# Patient Record
Sex: Female | Born: 1980
Health system: Southern US, Community
[De-identification: ages and names within clinical notes are randomized; demographics above are authoritative.]

## PROBLEM LIST (undated history)

## (undated) DIAGNOSIS — R519 Headache, unspecified: Secondary | ICD-10-CM

## (undated) DIAGNOSIS — I10 Essential (primary) hypertension: Secondary | ICD-10-CM

## (undated) DIAGNOSIS — Z720 Tobacco use: Secondary | ICD-10-CM

## (undated) DIAGNOSIS — E079 Disorder of thyroid, unspecified: Secondary | ICD-10-CM

## (undated) DIAGNOSIS — R0602 Shortness of breath: Secondary | ICD-10-CM

## (undated) DIAGNOSIS — A159 Respiratory tuberculosis unspecified: Secondary | ICD-10-CM

## (undated) DIAGNOSIS — R079 Chest pain, unspecified: Secondary | ICD-10-CM

## (undated) HISTORY — DX: Tobacco use: Z72.0

## (undated) HISTORY — DX: Headache, unspecified: R51.9

## (undated) HISTORY — DX: Chest pain, unspecified: R07.9

## (undated) HISTORY — DX: Respiratory tuberculosis unspecified: A15.9

## (undated) HISTORY — DX: Disorder of thyroid, unspecified: E07.9

## (undated) HISTORY — DX: Shortness of breath: R06.02

---

## 2000-07-26 HISTORY — PX: CHOLECYSTECTOMY: SHX55

## 2003-01-21 ENCOUNTER — Emergency Department (HOSPITAL_COMMUNITY): Admission: EM | Admit: 2003-01-21 | Discharge: 2003-01-21 | Payer: Self-pay | Admitting: Emergency Medicine

## 2003-03-05 ENCOUNTER — Emergency Department (HOSPITAL_COMMUNITY): Admission: EM | Admit: 2003-03-05 | Discharge: 2003-03-06 | Payer: Self-pay | Admitting: Emergency Medicine

## 2003-04-12 ENCOUNTER — Ambulatory Visit (HOSPITAL_COMMUNITY): Admission: RE | Admit: 2003-04-12 | Discharge: 2003-04-12 | Payer: Self-pay | Admitting: *Deleted

## 2003-05-18 ENCOUNTER — Inpatient Hospital Stay (HOSPITAL_COMMUNITY): Admission: AD | Admit: 2003-05-18 | Discharge: 2003-05-19 | Payer: Self-pay | Admitting: *Deleted

## 2003-06-17 ENCOUNTER — Ambulatory Visit (HOSPITAL_COMMUNITY): Admission: RE | Admit: 2003-06-17 | Discharge: 2003-06-17 | Payer: Self-pay | Admitting: *Deleted

## 2003-08-26 ENCOUNTER — Encounter: Admission: RE | Admit: 2003-08-26 | Discharge: 2003-08-26 | Payer: Self-pay | Admitting: *Deleted

## 2003-08-30 ENCOUNTER — Inpatient Hospital Stay (HOSPITAL_COMMUNITY): Admission: AD | Admit: 2003-08-30 | Discharge: 2003-08-31 | Payer: Self-pay | Admitting: Family Medicine

## 2003-10-22 ENCOUNTER — Emergency Department (HOSPITAL_COMMUNITY): Admission: EM | Admit: 2003-10-22 | Discharge: 2003-10-23 | Payer: Self-pay | Admitting: Emergency Medicine

## 2003-12-12 ENCOUNTER — Emergency Department (HOSPITAL_COMMUNITY): Admission: EM | Admit: 2003-12-12 | Discharge: 2003-12-12 | Payer: Self-pay | Admitting: Emergency Medicine

## 2006-02-07 ENCOUNTER — Emergency Department (HOSPITAL_COMMUNITY): Admission: EM | Admit: 2006-02-07 | Discharge: 2006-02-07 | Payer: Self-pay | Admitting: Emergency Medicine

## 2006-10-10 ENCOUNTER — Inpatient Hospital Stay (HOSPITAL_COMMUNITY): Admission: AD | Admit: 2006-10-10 | Discharge: 2006-10-10 | Payer: Self-pay | Admitting: Family Medicine

## 2006-10-10 ENCOUNTER — Ambulatory Visit: Payer: Self-pay | Admitting: Family Medicine

## 2006-11-17 ENCOUNTER — Ambulatory Visit (HOSPITAL_COMMUNITY): Admission: RE | Admit: 2006-11-17 | Discharge: 2006-11-17 | Payer: Self-pay | Admitting: Obstetrics & Gynecology

## 2007-02-21 ENCOUNTER — Ambulatory Visit: Payer: Self-pay | Admitting: Obstetrics & Gynecology

## 2007-02-26 ENCOUNTER — Ambulatory Visit: Payer: Self-pay | Admitting: *Deleted

## 2007-02-26 ENCOUNTER — Inpatient Hospital Stay (HOSPITAL_COMMUNITY): Admission: AD | Admit: 2007-02-26 | Discharge: 2007-02-28 | Payer: Self-pay | Admitting: Family Medicine

## 2008-04-11 ENCOUNTER — Emergency Department (HOSPITAL_COMMUNITY): Admission: EM | Admit: 2008-04-11 | Discharge: 2008-04-12 | Payer: Self-pay | Admitting: *Deleted

## 2011-04-13 LAB — HEPATITIS B SURFACE ANTIGEN: Hepatitis B Surface Ag: NEGATIVE

## 2011-04-13 LAB — HIV ANTIBODY (ROUTINE TESTING W REFLEX)
HIV: NONREACTIVE
HIV: NONREACTIVE

## 2011-04-13 LAB — GC/CHLAMYDIA PROBE AMP, GENITAL
Chlamydia: NEGATIVE
Gonorrhea: NEGATIVE

## 2011-04-13 LAB — ABO/RH: RH Type: POSITIVE

## 2011-04-13 LAB — ANTIBODY SCREEN: Antibody Screen: NEGATIVE

## 2011-04-13 LAB — RUBELLA ANTIBODY, IGM: Rubella: IMMUNE

## 2011-04-13 LAB — RPR
RPR: NONREACTIVE
RPR: NONREACTIVE

## 2011-05-10 LAB — CBC
HCT: 27.8 — ABNORMAL LOW
HCT: 34.5 — ABNORMAL LOW
Hemoglobin: 12
Hemoglobin: 9.8 — ABNORMAL LOW
MCHC: 34.8
MCHC: 35.3
MCV: 89.2
MCV: 89.3
Platelets: 200
Platelets: 243
RBC: 3.11 — ABNORMAL LOW
RBC: 3.87
RDW: 13.6
RDW: 13.9
WBC: 7.9
WBC: 8.2

## 2011-05-10 LAB — RPR: RPR Ser Ql: NONREACTIVE

## 2011-06-28 ENCOUNTER — Encounter (HOSPITAL_COMMUNITY): Payer: Self-pay | Admitting: *Deleted

## 2011-06-28 ENCOUNTER — Telehealth (HOSPITAL_COMMUNITY): Payer: Self-pay | Admitting: *Deleted

## 2011-06-28 NOTE — Telephone Encounter (Signed)
Preadmission screen  

## 2011-06-29 ENCOUNTER — Encounter (HOSPITAL_COMMUNITY): Payer: Self-pay | Admitting: *Deleted

## 2011-06-29 NOTE — Telephone Encounter (Signed)
Preadmission screen  

## 2011-07-01 ENCOUNTER — Inpatient Hospital Stay (HOSPITAL_COMMUNITY)
Admission: AD | Admit: 2011-07-01 | Discharge: 2011-07-04 | DRG: 766 | Disposition: A | Discharge status: Home / Self Care | Payer: Medicaid Other | Source: Ambulatory Visit | Attending: Obstetrics | Admitting: Obstetrics

## 2011-07-01 ENCOUNTER — Encounter (HOSPITAL_COMMUNITY): Payer: Self-pay

## 2011-07-01 DIAGNOSIS — Z98891 History of uterine scar from previous surgery: Secondary | ICD-10-CM

## 2011-07-01 DIAGNOSIS — O321XX0 Maternal care for breech presentation, not applicable or unspecified: Secondary | ICD-10-CM | POA: Diagnosis present

## 2011-07-01 DIAGNOSIS — O99814 Abnormal glucose complicating childbirth: Principal | ICD-10-CM | POA: Diagnosis present

## 2011-07-01 LAB — CBC
Hemoglobin: 13 g/dL (ref 12.0–15.0)
MCHC: 34.8 g/dL (ref 30.0–36.0)
RDW: 12.6 % (ref 11.5–15.5)
WBC: 8 10*3/uL (ref 4.0–10.5)

## 2011-07-01 LAB — RPR: RPR Ser Ql: NONREACTIVE

## 2011-07-01 LAB — GLUCOSE, CAPILLARY: Glucose-Capillary: 75 mg/dL (ref 70–99)

## 2011-07-01 LAB — HIV ANTIBODY (ROUTINE TESTING W REFLEX): HIV: NONREACTIVE

## 2011-07-01 MED ORDER — BUTORPHANOL TARTRATE 2 MG/ML IJ SOLN: 2.0000 mg | INTRAMUSCULAR | Status: DC | PRN

## 2011-07-01 MED ORDER — OXYCODONE-ACETAMINOPHEN 5-325 MG PO TABS: 2.0000 | ORAL_TABLET | ORAL | Status: DC | PRN

## 2011-07-01 MED ORDER — OXYTOCIN BOLUS FROM INFUSION
500.0000 mL | Freq: Once | INTRAVENOUS | Status: DC
  Filled 2011-07-01: qty 500

## 2011-07-01 MED ORDER — ACETAMINOPHEN 325 MG PO TABS: 650.0000 mg | ORAL_TABLET | ORAL | Status: DC | PRN

## 2011-07-01 MED ORDER — MISOPROSTOL 25 MCG QUARTER TABLET
25.0000 ug | ORAL_TABLET | Freq: Once | ORAL | Status: AC
  Administered 2011-07-01: 25 ug via VAGINAL
  Filled 2011-07-01: qty 0.25

## 2011-07-01 MED ORDER — ONDANSETRON HCL 4 MG/2ML IJ SOLN: 4.0000 mg | Freq: Four times a day (QID) | INTRAMUSCULAR | Status: DC | PRN

## 2011-07-01 MED ORDER — LIDOCAINE HCL (PF) 1 % IJ SOLN: 30.0000 mL | INTRAMUSCULAR | Status: DC | PRN

## 2011-07-01 MED ORDER — BUTORPHANOL TARTRATE 2 MG/ML IJ SOLN
1.0000 mg | INTRAMUSCULAR | Status: DC | PRN
  Administered 2011-07-01: 2 mg via INTRAVENOUS
  Filled 2011-07-01: qty 1

## 2011-07-01 MED ORDER — LACTATED RINGERS IV SOLN
500.0000 mL | INTRAVENOUS | Status: DC | PRN
  Administered 2011-07-01: 500 mL via INTRAVENOUS

## 2011-07-01 MED ORDER — OXYTOCIN 20 UNITS IN LACTATED RINGERS INFUSION - SIMPLE
1.0000 m[IU]/min | INTRAVENOUS | Status: DC
  Administered 2011-07-01: 1 m[IU]/min via INTRAVENOUS
  Filled 2011-07-01: qty 1000

## 2011-07-01 MED ORDER — OXYTOCIN 20 UNITS IN LACTATED RINGERS INFUSION - SIMPLE: 125.0000 mL/h | Freq: Once | INTRAVENOUS | Status: DC

## 2011-07-01 MED ORDER — TERBUTALINE SULFATE 1 MG/ML IJ SOLN: 0.2500 mg | Freq: Once | INTRAMUSCULAR | Status: AC | PRN

## 2011-07-01 MED ORDER — CITRIC ACID-SODIUM CITRATE 334-500 MG/5ML PO SOLN
30.0000 mL | ORAL | Status: DC | PRN
  Administered 2011-07-02: 30 mL via ORAL
  Filled 2011-07-01: qty 15

## 2011-07-01 MED ORDER — IBUPROFEN 600 MG PO TABS: 600.0000 mg | ORAL_TABLET | Freq: Four times a day (QID) | ORAL | Status: DC | PRN

## 2011-07-01 MED ORDER — LACTATED RINGERS IV SOLN
INTRAVENOUS | Status: DC
  Administered 2011-07-01 (×4): via INTRAVENOUS

## 2011-07-01 MED ORDER — FLEET ENEMA 7-19 GM/118ML RE ENEM: 1.0000 | ENEMA | RECTAL | Status: DC | PRN

## 2011-07-01 MED ORDER — BUTORPHANOL TARTRATE 2 MG/ML IJ SOLN: 1.0000 mg | INTRAMUSCULAR | Status: DC | PRN

## 2011-07-01 NOTE — Progress Notes (Signed)
MD made aware of pt status: SVE, uterine contraction pattern, FHT, Pitocin setting and SVE. New orders received. Will continue to monitor.

## 2011-07-01 NOTE — Progress Notes (Signed)
Dr Gaynell Face notified of pt status, FHR tracing with variables and varibility, Uc freq, interventions provided, and SVE. Order for pitocin.

## 2011-07-01 NOTE — Progress Notes (Signed)
Confirmed with office/Dr Marshall-pt HIV negative since pt had intermittent prenatal care.

## 2011-07-01 NOTE — H&P (Signed)
This is Dr. Francoise Ceo dictating the history and physical on  Tracy Carson she's a 30 year old gravida 4 para 63 old 3 at 44 weeks and 1 day her judicious 07/07/2011 negative GBS negative HIV the patient permissible her prenatal visits and had a normal Glucola screen and she will have a three-hour GTT brochures out-of-town surgery was decided should've brought in at 39 weeks for induction her glucose on admission was sent to 5 and she is having irregular contractions Cytotec was inserted and she still having mild irregular contractions Past medical history negative Past surgical history negative Social history negative System review noncontributory Physical exam Well-developed female in no distress HEENT negative Lungs clear to P&A Heart regular rhythm no murmurs no gallops Breasts negative Abdomen term Pelvic as described above Extremities negative

## 2011-07-01 NOTE — Progress Notes (Signed)
0945 Pt states she understands current POC for Cytotec, now and repeated x1 if no labor and possible low dose pitocin in PM.  States understanding she may deliver tomorrow due to unfavorable cervix.  FOB present. Offered Interpreter-pt states she understands POC and does not need interpreter.  Pt states she understands last HIV result negative and one is pending.

## 2011-07-02 ENCOUNTER — Encounter (HOSPITAL_COMMUNITY): Payer: Self-pay | Admitting: Anesthesiology

## 2011-07-02 ENCOUNTER — Other Ambulatory Visit: Payer: Self-pay | Admitting: Obstetrics & Gynecology

## 2011-07-02 ENCOUNTER — Inpatient Hospital Stay (HOSPITAL_COMMUNITY): Payer: Medicaid Other | Admitting: Anesthesiology

## 2011-07-02 ENCOUNTER — Encounter (HOSPITAL_COMMUNITY): Payer: Self-pay

## 2011-07-02 ENCOUNTER — Inpatient Hospital Stay (HOSPITAL_COMMUNITY): Payer: Medicaid Other

## 2011-07-02 ENCOUNTER — Encounter (HOSPITAL_COMMUNITY)
Admission: AD | Disposition: A | Discharge status: Home / Self Care | Payer: Self-pay | Source: Ambulatory Visit | Attending: Obstetrics

## 2011-07-02 DIAGNOSIS — O321XX0 Maternal care for breech presentation, not applicable or unspecified: Secondary | ICD-10-CM | POA: Diagnosis present

## 2011-07-02 LAB — CBC
HCT: 30.7 % — ABNORMAL LOW (ref 36.0–46.0)
Hemoglobin: 10.7 g/dL — ABNORMAL LOW (ref 12.0–15.0)
MCV: 85 fL (ref 78.0–100.0)
RBC: 3.61 MIL/uL — ABNORMAL LOW (ref 3.87–5.11)
RDW: 12.8 % (ref 11.5–15.5)
WBC: 10.5 10*3/uL (ref 4.0–10.5)

## 2011-07-02 LAB — GLUCOSE, CAPILLARY: Glucose-Capillary: 99 mg/dL (ref 70–99)

## 2011-07-02 LAB — TYPE AND SCREEN: ABO/RH(D): O POS

## 2011-07-02 SURGERY — Surgical Case
Anesthesia: Spinal | Site: Abdomen | Wound class: Clean Contaminated

## 2011-07-02 MED ORDER — MORPHINE SULFATE 0.5 MG/ML IJ SOLN
INTRAMUSCULAR | Status: AC
  Filled 2011-07-02: qty 10

## 2011-07-02 MED ORDER — DIBUCAINE 1 % RE OINT: 1.0000 "application " | TOPICAL_OINTMENT | RECTAL | Status: DC | PRN

## 2011-07-02 MED ORDER — OXYTOCIN 20 UNITS IN LACTATED RINGERS INFUSION - SIMPLE: 125.0000 mL/h | INTRAVENOUS | Status: AC

## 2011-07-02 MED ORDER — LACTATED RINGERS IV SOLN
INTRAVENOUS | Status: DC
  Administered 2011-07-02: 11:00:00 via INTRAVENOUS

## 2011-07-02 MED ORDER — IBUPROFEN 600 MG PO TABS
600.0000 mg | ORAL_TABLET | Freq: Four times a day (QID) | ORAL | Status: DC
  Administered 2011-07-02 – 2011-07-04 (×9): 600 mg via ORAL
  Filled 2011-07-02 (×5): qty 1

## 2011-07-02 MED ORDER — MEASLES, MUMPS & RUBELLA VAC ~~LOC~~ INJ
0.5000 mL | INJECTION | Freq: Once | SUBCUTANEOUS | Status: DC
  Filled 2011-07-02: qty 0.5

## 2011-07-02 MED ORDER — CEFAZOLIN SODIUM 1-5 GM-% IV SOLN
INTRAVENOUS | Status: DC | PRN
  Administered 2011-07-02: 2 g via INTRAVENOUS

## 2011-07-02 MED ORDER — SIMETHICONE 80 MG PO CHEW
80.0000 mg | CHEWABLE_TABLET | ORAL | Status: DC | PRN
  Administered 2011-07-03 (×2): 80 mg via ORAL

## 2011-07-02 MED ORDER — SODIUM CHLORIDE 0.9 % IV SOLN
1.0000 ug/kg/h | INTRAVENOUS | Status: DC | PRN
  Filled 2011-07-02: qty 2.5

## 2011-07-02 MED ORDER — SCOPOLAMINE 1 MG/3DAYS TD PT72
MEDICATED_PATCH | TRANSDERMAL | Status: AC
  Filled 2011-07-02: qty 1

## 2011-07-02 MED ORDER — DIPHENHYDRAMINE HCL 50 MG/ML IJ SOLN
INTRAMUSCULAR | Status: DC | PRN
  Administered 2011-07-02: 25 mg via INTRAVENOUS

## 2011-07-02 MED ORDER — KETOROLAC TROMETHAMINE 30 MG/ML IJ SOLN
30.0000 mg | Freq: Four times a day (QID) | INTRAMUSCULAR | Status: AC | PRN
  Administered 2011-07-02: 30 mg via INTRAVENOUS
  Filled 2011-07-02 (×2): qty 1

## 2011-07-02 MED ORDER — CEFAZOLIN SODIUM-DEXTROSE 2-3 GM-% IV SOLR
2.0000 g | INTRAVENOUS | Status: DC
Start: 2011-07-02 — End: 2011-07-02
  Filled 2011-07-02: qty 50

## 2011-07-02 MED ORDER — FENTANYL CITRATE 0.05 MG/ML IJ SOLN
INTRAMUSCULAR | Status: AC
  Filled 2011-07-02: qty 2

## 2011-07-02 MED ORDER — ONDANSETRON HCL 4 MG/2ML IJ SOLN: 4.0000 mg | Freq: Three times a day (TID) | INTRAMUSCULAR | Status: DC | PRN

## 2011-07-02 MED ORDER — OXYTOCIN 20 UNITS IN LACTATED RINGERS INFUSION - SIMPLE
INTRAVENOUS | Status: DC | PRN
  Administered 2011-07-02 (×2): 20 [IU] via INTRAVENOUS

## 2011-07-02 MED ORDER — DIPHENHYDRAMINE HCL 25 MG PO CAPS: 25.0000 mg | ORAL_CAPSULE | ORAL | Status: DC | PRN

## 2011-07-02 MED ORDER — IBUPROFEN 600 MG PO TABS
600.0000 mg | ORAL_TABLET | Freq: Four times a day (QID) | ORAL | Status: DC | PRN
  Filled 2011-07-02 (×4): qty 1

## 2011-07-02 MED ORDER — ONDANSETRON HCL 4 MG/2ML IJ SOLN: 4.0000 mg | INTRAMUSCULAR | Status: DC | PRN

## 2011-07-02 MED ORDER — FERROUS SULFATE 325 (65 FE) MG PO TABS
325.0000 mg | ORAL_TABLET | Freq: Two times a day (BID) | ORAL | Status: DC
  Administered 2011-07-02 – 2011-07-04 (×4): 325 mg via ORAL
  Filled 2011-07-02 (×4): qty 1

## 2011-07-02 MED ORDER — DIPHENHYDRAMINE HCL 50 MG/ML IJ SOLN: 12.5000 mg | INTRAMUSCULAR | Status: DC | PRN

## 2011-07-02 MED ORDER — TETANUS-DIPHTH-ACELL PERTUSSIS 5-2.5-18.5 LF-MCG/0.5 IM SUSP
0.5000 mL | Freq: Once | INTRAMUSCULAR | Status: AC
  Administered 2011-07-03: 0.5 mL via INTRAMUSCULAR
  Filled 2011-07-02: qty 0.5

## 2011-07-02 MED ORDER — NALOXONE HCL 0.4 MG/ML IJ SOLN: 0.4000 mg | INTRAMUSCULAR | Status: DC | PRN

## 2011-07-02 MED ORDER — LANOLIN HYDROUS EX OINT: 1.0000 "application " | TOPICAL_OINTMENT | CUTANEOUS | Status: DC | PRN

## 2011-07-02 MED ORDER — KETOROLAC TROMETHAMINE 30 MG/ML IJ SOLN: 30.0000 mg | Freq: Once | INTRAMUSCULAR | Status: AC

## 2011-07-02 MED ORDER — DIPHENHYDRAMINE HCL 50 MG/ML IJ SOLN
INTRAMUSCULAR | Status: AC
  Filled 2011-07-02: qty 1

## 2011-07-02 MED ORDER — FENTANYL CITRATE 0.05 MG/ML IJ SOLN
INTRAMUSCULAR | Status: DC | PRN
  Administered 2011-07-02: 15 ug via INTRATHECAL

## 2011-07-02 MED ORDER — CEFAZOLIN SODIUM 1-5 GM-% IV SOLN
1.0000 g | Freq: Three times a day (TID) | INTRAVENOUS | Status: AC
  Administered 2011-07-02 – 2011-07-03 (×3): 1 g via INTRAVENOUS
  Filled 2011-07-02 (×3): qty 50

## 2011-07-02 MED ORDER — ZOLPIDEM TARTRATE 5 MG PO TABS: 5.0000 mg | ORAL_TABLET | Freq: Every evening | ORAL | Status: DC | PRN

## 2011-07-02 MED ORDER — FENTANYL CITRATE 0.05 MG/ML IJ SOLN
INTRAMUSCULAR | Status: DC | PRN
  Administered 2011-07-02: 50 ug via INTRAVENOUS
  Administered 2011-07-02: 35 ug via INTRAVENOUS

## 2011-07-02 MED ORDER — KETOROLAC TROMETHAMINE 60 MG/2ML IM SOLN
60.0000 mg | Freq: Once | INTRAMUSCULAR | Status: AC | PRN
  Administered 2011-07-02: 60 mg via INTRAMUSCULAR

## 2011-07-02 MED ORDER — CEFAZOLIN SODIUM 1-5 GM-% IV SOLN
INTRAVENOUS | Status: AC
  Filled 2011-07-02: qty 100

## 2011-07-02 MED ORDER — MORPHINE SULFATE (PF) 0.5 MG/ML IJ SOLN
INTRAMUSCULAR | Status: DC | PRN
  Administered 2011-07-02: .1 mg via EPIDURAL

## 2011-07-02 MED ORDER — ONDANSETRON HCL 4 MG PO TABS: 4.0000 mg | ORAL_TABLET | ORAL | Status: DC | PRN

## 2011-07-02 MED ORDER — ONDANSETRON HCL 4 MG/2ML IJ SOLN
INTRAMUSCULAR | Status: AC
  Filled 2011-07-02: qty 2

## 2011-07-02 MED ORDER — MORPHINE SULFATE (PF) 0.5 MG/ML IJ SOLN
INTRAMUSCULAR | Status: DC | PRN
  Administered 2011-07-02: 2 ug via EPIDURAL

## 2011-07-02 MED ORDER — NALBUPHINE HCL 10 MG/ML IJ SOLN
5.0000 mg | INTRAMUSCULAR | Status: DC | PRN
  Filled 2011-07-02: qty 1

## 2011-07-02 MED ORDER — KETOROLAC TROMETHAMINE 30 MG/ML IJ SOLN: 30.0000 mg | Freq: Four times a day (QID) | INTRAMUSCULAR | Status: AC | PRN

## 2011-07-02 MED ORDER — ONDANSETRON HCL 4 MG/2ML IJ SOLN
INTRAMUSCULAR | Status: DC | PRN
  Administered 2011-07-02: 4 mg via INTRAVENOUS

## 2011-07-02 MED ORDER — METOCLOPRAMIDE HCL 5 MG/ML IJ SOLN: 10.0000 mg | Freq: Three times a day (TID) | INTRAMUSCULAR | Status: DC | PRN

## 2011-07-02 MED ORDER — WITCH HAZEL-GLYCERIN EX PADS: 1.0000 "application " | MEDICATED_PAD | CUTANEOUS | Status: DC | PRN

## 2011-07-02 MED ORDER — DIPHENHYDRAMINE HCL 25 MG PO CAPS: 25.0000 mg | ORAL_CAPSULE | Freq: Four times a day (QID) | ORAL | Status: DC | PRN

## 2011-07-02 MED ORDER — BUPIVACAINE IN DEXTROSE 0.75-8.25 % IT SOLN
INTRATHECAL | Status: DC | PRN
  Administered 2011-07-02: 11.75 mg via INTRATHECAL

## 2011-07-02 MED ORDER — PRENATAL PLUS 27-1 MG PO TABS
1.0000 | ORAL_TABLET | Freq: Every day | ORAL | Status: DC
  Administered 2011-07-02 – 2011-07-04 (×3): 1 via ORAL
  Filled 2011-07-02 (×3): qty 1

## 2011-07-02 MED ORDER — DIPHENHYDRAMINE HCL 50 MG/ML IJ SOLN: 25.0000 mg | INTRAMUSCULAR | Status: DC | PRN

## 2011-07-02 MED ORDER — MEPERIDINE HCL 25 MG/ML IJ SOLN: 6.2500 mg | INTRAMUSCULAR | Status: DC | PRN

## 2011-07-02 MED ORDER — SCOPOLAMINE 1 MG/3DAYS TD PT72
1.0000 | MEDICATED_PATCH | Freq: Once | TRANSDERMAL | Status: DC
  Administered 2011-07-02: 1.5 mg via TRANSDERMAL

## 2011-07-02 MED ORDER — LACTATED RINGERS IV SOLN
INTRAVENOUS | Status: DC | PRN
  Administered 2011-07-02 (×3): via INTRAVENOUS

## 2011-07-02 MED ORDER — OXYCODONE-ACETAMINOPHEN 5-325 MG PO TABS
1.0000 | ORAL_TABLET | ORAL | Status: DC | PRN
  Administered 2011-07-02 (×3): 1 via ORAL
  Administered 2011-07-02 – 2011-07-03 (×2): 2 via ORAL
  Administered 2011-07-03: 1 via ORAL
  Administered 2011-07-03: 2 via ORAL
  Administered 2011-07-03: 1 via ORAL
  Administered 2011-07-03 – 2011-07-04 (×3): 2 via ORAL
  Filled 2011-07-02 (×4): qty 2
  Filled 2011-07-02: qty 1
  Filled 2011-07-02: qty 2
  Filled 2011-07-02 (×3): qty 1
  Filled 2011-07-02 (×2): qty 2

## 2011-07-02 MED ORDER — SODIUM CHLORIDE 0.9 % IJ SOLN: 3.0000 mL | INTRAMUSCULAR | Status: DC | PRN

## 2011-07-02 MED ORDER — OXYTOCIN 10 UNIT/ML IJ SOLN
INTRAMUSCULAR | Status: AC
  Filled 2011-07-02: qty 2

## 2011-07-02 MED ORDER — SENNOSIDES-DOCUSATE SODIUM 8.6-50 MG PO TABS
2.0000 | ORAL_TABLET | Freq: Every day | ORAL | Status: DC
  Administered 2011-07-03 (×2): 2 via ORAL

## 2011-07-02 MED ORDER — FENTANYL CITRATE 0.05 MG/ML IJ SOLN
25.0000 ug | INTRAMUSCULAR | Status: DC | PRN
  Administered 2011-07-02 (×2): 50 ug via INTRAVENOUS

## 2011-07-02 MED ORDER — KETOROLAC TROMETHAMINE 60 MG/2ML IM SOLN
INTRAMUSCULAR | Status: AC
  Filled 2011-07-02: qty 2

## 2011-07-02 MED ORDER — MAGNESIUM HYDROXIDE 400 MG/5ML PO SUSP: 30.0000 mL | ORAL | Status: DC | PRN

## 2011-07-02 MED ORDER — MEDROXYPROGESTERONE ACETATE 150 MG/ML IM SUSP: 150.0000 mg | INTRAMUSCULAR | Status: DC | PRN

## 2011-07-02 SURGICAL SUPPLY — 41 items
BENZOIN TINCTURE PRP APPL 2/3 (GAUZE/BANDAGES/DRESSINGS) ×2 IMPLANT
CANISTER WOUND CARE 500ML ATS (WOUND CARE) IMPLANT
CHLORAPREP W/TINT 26ML (MISCELLANEOUS) ×2 IMPLANT
CLOTH BEACON ORANGE TIMEOUT ST (SAFETY) ×2 IMPLANT
CONTAINER PREFILL 10% NBF 15ML (MISCELLANEOUS) IMPLANT
DRESSING TELFA 8X3 (GAUZE/BANDAGES/DRESSINGS) ×2 IMPLANT
DRSG COVADERM 4X8 (GAUZE/BANDAGES/DRESSINGS) ×2 IMPLANT
DRSG VAC ATS LRG SENSATRAC (GAUZE/BANDAGES/DRESSINGS) IMPLANT
DRSG VAC ATS MED SENSATRAC (GAUZE/BANDAGES/DRESSINGS) IMPLANT
DRSG VAC ATS SM SENSATRAC (GAUZE/BANDAGES/DRESSINGS) IMPLANT
ELECT REM PT RETURN 9FT ADLT (ELECTROSURGICAL) ×2
ELECTRODE REM PT RTRN 9FT ADLT (ELECTROSURGICAL) ×1 IMPLANT
EXTRACTOR VACUUM M CUP 4 TUBE (SUCTIONS) IMPLANT
GAUZE SPONGE 4X4 12PLY STRL LF (GAUZE/BANDAGES/DRESSINGS) ×4 IMPLANT
GLOVE BIO SURGEON STRL SZ 6.5 (GLOVE) ×4 IMPLANT
GOWN PREVENTION PLUS LG XLONG (DISPOSABLE) ×6 IMPLANT
KIT ABG SYR 3ML LUER SLIP (SYRINGE) IMPLANT
NEEDLE HYPO 25X5/8 SAFETYGLIDE (NEEDLE) ×2 IMPLANT
NS IRRIG 1000ML POUR BTL (IV SOLUTION) ×2 IMPLANT
PACK C SECTION WH (CUSTOM PROCEDURE TRAY) ×2 IMPLANT
PAD ABD 7.5X8 STRL (GAUZE/BANDAGES/DRESSINGS) ×2 IMPLANT
RTRCTR C-SECT PINK 25CM LRG (MISCELLANEOUS) IMPLANT
RTRCTR C-SECT PINK 34CM XLRG (MISCELLANEOUS) IMPLANT
SLEEVE SCD COMPRESS KNEE MED (MISCELLANEOUS) IMPLANT
STAPLER VISISTAT 35W (STAPLE) IMPLANT
STRIP CLOSURE SKIN 1/2X4 (GAUZE/BANDAGES/DRESSINGS) ×2 IMPLANT
SUT MNCRL 0 VIOLET CTX 36 (SUTURE) ×2 IMPLANT
SUT MNCRL AB 3-0 PS2 27 (SUTURE) IMPLANT
SUT MONOCRYL 0 CTX 36 (SUTURE) ×2
SUT PDS AB 0 CTX 36 PDP370T (SUTURE) ×2 IMPLANT
SUT PLAIN 0 NONE (SUTURE) IMPLANT
SUT VIC AB 0 CT1 27 (SUTURE)
SUT VIC AB 0 CT1 27XBRD ANBCTR (SUTURE) IMPLANT
SUT VIC AB 2-0 CT1 (SUTURE) IMPLANT
SUT VIC AB 2-0 CT1 27 (SUTURE) ×1
SUT VIC AB 2-0 CT1 TAPERPNT 27 (SUTURE) ×1 IMPLANT
SUT VIC AB 2-0 SH 27 (SUTURE)
SUT VIC AB 2-0 SH 27XBRD (SUTURE) IMPLANT
TOWEL OR 17X24 6PK STRL BLUE (TOWEL DISPOSABLE) ×4 IMPLANT
TRAY FOLEY CATH 14FR (SET/KITS/TRAYS/PACK) ×2 IMPLANT
WATER STERILE IRR 1000ML POUR (IV SOLUTION) ×2 IMPLANT

## 2011-07-02 NOTE — Transfer of Care (Signed)
Immediate Anesthesia Transfer of Care Note  Patient: Tracy Carson  Procedure(s) Performed:  CESAREAN SECTION  Patient Location: PACU  Anesthesia Type: Spinal  Level of Consciousness: awake, alert  and oriented  Airway & Oxygen Therapy: Patient Spontanous Breathing  Post-op Assessment: Report given to PACU RN  Post vital signs: Reviewed and stable  Complications: No apparent anesthesia complications

## 2011-07-02 NOTE — Anesthesia Postprocedure Evaluation (Signed)
Anesthesia Post Note  Patient: Tracy Carson  Procedure(s) Performed:  CESAREAN SECTION  Anesthesia type: SAB  Patient location: Mother/Baby  Post pain: Pain level controlled  Post assessment: Post-op Vital signs reviewed  Last Vitals:  Filed Vitals:   07/02/11 0630  BP: 130/79  Pulse: 70  Temp: 37.8 C  Resp: 16    Post vital signs: Reviewed  Level of consciousness: awake  Complications: No apparent anesthesia complications

## 2011-07-02 NOTE — Progress Notes (Signed)
Ambulated w/stand-by assist. To BR. C/o abd. Pain w/mobility/ F/C removed. Sweating, upon ambulation to bed. W/stand-by assist pt. Feeling faint, spouse assist w/me pt to bed. She didn't pass out, sat on edge of bed, asssited in laying position. Water pooling on face. MD notified. V/S WDL. Orders received. CBG 99. Tolerating food and fluids well. Pain medication helpful w/pain. Noted improvement. ABT's started. Dr. Trudie Buckler notified.

## 2011-07-02 NOTE — Consult Note (Signed)
Neonatology Note:  Attendance at C-section:  I was asked to attend this primary C/S at term due to breech presentation. The mother is a G4P3 O pos, GBS neg with diet-controlled gestational DM. ROM 6 hours prior to delivery, fluid clear. Infant vigorous with good spontaneous cry and tone. Needed only minimal bulb suctioning. Ap 9/9. Lungs clear to ausc in DR. To CN to care of Pediatrician.  Naziah Weckerly, MD  

## 2011-07-02 NOTE — Anesthesia Procedure Notes (Addendum)
Spinal  Patient location during procedure: OB Start time: 07/02/2011 1:18 AM Staffing Performed by: anesthesiologist  Preanesthetic Checklist Completed: patient identified, site marked, surgical consent, pre-op evaluation, timeout performed, IV checked, risks and benefits discussed and monitors and equipment checked Spinal Block Patient position: sitting Prep: DuraPrep Patient monitoring: heart rate, cardiac monitor, continuous pulse ox and blood pressure Approach: midline Location: L3-4 Injection technique: single-shot Needle Needle type: Sprotte  Needle gauge: 24 G Needle length: 9 cm Assessment Sensory level: T4 Additional Notes Clear free flow CSF on first attempt.  Patient tolerated procedure well.  Jasmine December, MD

## 2011-07-02 NOTE — Progress Notes (Signed)
MD made aware of last SVE with questionable presenting part. U/S to be done at bedside. Will continue to monitor.

## 2011-07-02 NOTE — Progress Notes (Signed)
UR chart review completed.  

## 2011-07-02 NOTE — Progress Notes (Signed)
Pt to OR via stretcher in stable condition. 

## 2011-07-02 NOTE — Addendum Note (Signed)
Addendum  created 07/02/11 1610 by Doreene Burke   Modules edited:Notes Section

## 2011-07-02 NOTE — Anesthesia Postprocedure Evaluation (Signed)
Anesthesia Post Note  Patient: Tracy Carson  Procedure(s) Performed:  CESAREAN SECTION  Anesthesia type: Spinal  Patient location: PACU  Post pain: Pain level controlled  Post assessment: Post-op Vital signs reviewed  Last Vitals:  Filed Vitals:   07/02/11 0415  BP: 119/73  Pulse: 77  Temp: 36.7 C  Resp: 25    Post vital signs: Reviewed  Level of consciousness: awake  Complications: No apparent anesthesia complications

## 2011-07-02 NOTE — Anesthesia Preprocedure Evaluation (Signed)
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  h/o 3 prior SVD without epidural)                           Anesthesia Physical Anesthesia Plan  ASA: II and Emergent  Anesthesia Plan: Spinal   Post-op Pain Management:    Induction:   Airway Management Planned:   Additional Equipment:   Intra-op Plan:   Post-operative Plan:   Informed Consent: I have reviewed the patients History and Physical, chart, labs and discussed the procedure including the risks, benefits and alternatives for the proposed anesthesia with the patient or authorized representative who has indicated his/her understanding and acceptance.   Dental Advisory Given  Plan Discussed with: CRNA and Surgeon  Anesthesia Plan Comments:         Anesthesia Quick Evaluation

## 2011-07-02 NOTE — Op Note (Signed)
Cesarean Section Procedure Note   Tracy Carson   07/01/2011 - 07/02/2011  Indications: Breech Presentation   Pre-operative Diagnosis: Intrauterine Pregnancy At Term; Laboring; Ruptured Membranes; Breech Presentation.   Post-operative Diagnosis: Same   Surgeon: Antionette Char A  Assistants: none  Anesthesia: spinal  Procedure Details:  The patient was seen in the Holding Room. The risks, benefits, complications, treatment options, and expected outcomes were discussed with the patient. The patient concurred with the proposed plan, giving informed consent. The patient was identified as Tracy Carson and the procedure verified as C-Section Delivery. A Time Out was held and the above information confirmed.  After induction of anesthesia, the patient was draped and prepped in the usual sterile manner. A transverse incision was made and carried down through the subcutaneous tissue to the fascia. The fascial incision was made and extended transversely. The fascia was separated from the underlying rectus tissue superiorly and inferiorly. The peritoneum was identified and entered. The peritoneal incision was extended longitudinally. An Alexis retractor was placed into the incision.  The utero-vesical peritoneal reflection was incised transversely. A low transverse uterine incision was made. A complete breech extraction was performed. A cord ph was not sent. The umbilical cord was clamped and cut cord. A sample was obtained for evaluation. The placenta was removed Intact and appeared normal.  The uterine incision was closed with running locked sutures of 1-0 Monocryl. A second imbricating layer of the same suture was placed.  Hemostasis was observed. The paracolic gutters were irrigated. The fascia was then reapproximated with running sutures of 1-0Vicryl. The subcuticular closure was performed using 3-0 Monocryl.  Instrument, sponge, and needle counts were correct prior the abdominal closure and were correct  at the conclusion of the case.    Findings:  Homero Fellers breech presentation, left sacrum anterior   Estimated Blood Loss: 400 ml  Total IV Fluids: per Anesthesiology  Urine Output: per Anesthesiology  Specimens: Placenta  Complications: no complications  Disposition: PACU - hemodynamically stable.  Maternal Condition: stable   Baby condition / location:  nursery-stable    Signed: Surgeon(s): Roseanna Rainbow, MD

## 2011-07-02 NOTE — Progress Notes (Signed)
Donneisha Beane is a 30 y.o. [phone] at [redacted]w[redacted]d by LMP admitted for induction of labor due to Gestational Diabetes.  Subjective: Coping w/UCs  Objective: BP 126/81  Pulse 90  Temp(Src) 97.7 F (36.5 C) (Oral)  Resp 20  Ht 5\' 4"  (1.626 m)  Wt 81.647 kg (180 lb)  BMI 30.90 kg/m2      FHT:  FHR: 140 bpm, variability: moderate,  accelerations:  Present,  decelerations:  Absent UC:   irregular, every 10 minutes SVE:   Dilation: 2.5 Effacement (%): 80 Station: -2 Exam by:: Felipa Furnace RN  U/S at bedside: breech presentation  Labs: Lab Results  Component Value Date   WBC 8.0 07/01/2011   HGB 13.0 07/01/2011   HCT 37.4 07/01/2011   MCV 84.0 07/01/2011   PLT 236 07/01/2011    Assessment / Plan: Early labor, SROM.  Breech presentation   Anticipated MOD:  C/D  JACKSON-MOORE,Lanah Steines A 07/02/2011, 1:08 AM

## 2011-07-02 NOTE — Progress Notes (Signed)
Pt up to bathroom with assistance; requesting additional pain medication for c/o lower abd/incision pain; rating pain 6/10; Dr Tamela Oddi notified and states ok to give Percocet now. Given as ordered. Order for Ancef discontinued by verbal order Dr Tamela Oddi

## 2011-07-03 LAB — HEMOGLOBIN: Hemoglobin: 10.3 g/dL — ABNORMAL LOW (ref 12.0–15.0)

## 2011-07-03 NOTE — Progress Notes (Signed)
Patient ID: Tracy Carson, female   DOB: [birth year], 30 y.o.   MRN: [id number] Postop day 1 Bile signs normal Fundus firm Legs negative No complaints

## 2011-07-04 NOTE — Discharge Summary (Signed)
  Discharge instructions   You can wash your hair  Shower  Eat what you want  Drink what you want  See me in 6 weeks  Your ankles are going to swell more in the next 2 weeks than when pregnant  No sex for 6 weeks   MARSHALL,BERNARD A, MD 07/04/2011

## 2011-07-04 NOTE — Discharge Summary (Signed)
Obstetric Discharge Summary Reason for Admission: onset of labor Prenatal Procedures: none Intrapartum Procedures: cesarean: low cervical, transverse Postpartum Procedures: none Complications-Operative and Postpartum: none Hemoglobin  Date Value Range Status  07/03/2011 10.3* 12.0-15.0 (g/dL) Final     HCT  Date Value Range Status  07/02/2011 30.7* 36.0-46.0 (%) Final    Discharge Diagnoses: Term Pregnancy-delivered  Discharge Information: Date: 07/04/2011 Activity: pelvic rest Diet: routine Medications: Tylenol #3 Condition: stable Instructions: refer to practice specific booklet Discharge to: home Follow-up Information    Follow up with Magdalynn Davilla A, MD. Call in 6 weeks.   Contact information:   [address] [place] [address] [phone]          Newborn Data: Live born female  Birth Weight: 7 lb 7.4 oz (3385 g) APGAR: 9, 9  Home with mother.  Dayrin Stallone A 07/04/2011, 6:16 AM

## 2011-07-05 ENCOUNTER — Encounter (HOSPITAL_COMMUNITY): Payer: Self-pay | Admitting: Obstetrics & Gynecology

## 2012-06-29 ENCOUNTER — Emergency Department (HOSPITAL_COMMUNITY)
Admission: EM | Admit: 2012-06-29 | Discharge: 2012-06-30 | Disposition: A | Discharge status: Home / Self Care | Payer: Self-pay | Attending: Emergency Medicine | Admitting: Emergency Medicine

## 2012-06-29 ENCOUNTER — Encounter (HOSPITAL_COMMUNITY): Payer: Self-pay | Admitting: *Deleted

## 2012-06-29 DIAGNOSIS — Z8611 Personal history of tuberculosis: Secondary | ICD-10-CM | POA: Insufficient documentation

## 2012-06-29 DIAGNOSIS — F17200 Nicotine dependence, unspecified, uncomplicated: Secondary | ICD-10-CM | POA: Insufficient documentation

## 2012-06-29 DIAGNOSIS — T7840XA Allergy, unspecified, initial encounter: Secondary | ICD-10-CM

## 2012-06-29 DIAGNOSIS — I10 Essential (primary) hypertension: Secondary | ICD-10-CM | POA: Insufficient documentation

## 2012-06-29 DIAGNOSIS — R21 Rash and other nonspecific skin eruption: Secondary | ICD-10-CM | POA: Insufficient documentation

## 2012-06-29 NOTE — ED Notes (Signed)
Pt has been having itching rashes for over a month.  Pt states that rash comes and goes.  Pt has been taking tylenol only for this at home

## 2012-06-30 MED ORDER — FAMOTIDINE 10 MG PO TABS: ORAL_TABLET | ORAL | Status: DC

## 2012-06-30 MED ORDER — FAMOTIDINE 20 MG PO TABS
20.0000 mg | ORAL_TABLET | Freq: Once | ORAL | Status: AC
  Administered 2012-06-30: 20 mg via ORAL
  Filled 2012-06-30: qty 1

## 2012-06-30 MED ORDER — HYDROCORTISONE 1 % EX CREA: TOPICAL_CREAM | CUTANEOUS | Status: DC

## 2012-06-30 MED ORDER — DIPHENHYDRAMINE HCL 12.5 MG/5ML PO ELIX
25.0000 mg | ORAL_SOLUTION | Freq: Once | ORAL | Status: AC
  Administered 2012-06-30: 25 mg via ORAL
  Filled 2012-06-30: qty 10

## 2012-06-30 NOTE — ED Provider Notes (Signed)
History     CSN: [id number]  Arrival date & time 06/29/12  2238   None     Chief Complaint  Patient presents with  . Rash    (Consider location/radiation/quality/duration/timing/severity/associated sxs/prior treatment) HPI History provided by pt through phone interpreter.  Pt has had intermittent, migrating pruritic rash for the past month.  Usually goes away when she applies Vick's Vapor Rub.  For the last three days, she has had a constant rash at base of neck.  Both pruritic and painful.  No relief w/ Vick's or tylenol.  No associated fever.  No trauma.  No known allergies or new contacts.  No recent tongue/lip edema or dyspnea.  No recent illnesses. No one else in household w/ similar rash.  Does not have any pets.  Past Medical History  Diagnosis Date  . 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 Cholecystectomy   . Cesarean section 07/02/2011    Procedure: CESAREAN SECTION;  Surgeon: Roseanna Rainbow, MD;  Location: [facility];  Service: Gynecology;  Laterality: N/A;    No family history on file.  History  Substance Use Topics  . Smoking status: Current Some Day Smoker  . Smokeless tobacco: Never Used  . Alcohol Use: No    OB History    Grav Para Term Preterm Abortions TAB SAB Ect Mult Living   4 4 4       3       Review of Systems  All other systems reviewed and are negative.    Allergies  Review of patient's allergies indicates no known allergies.  Home Medications   Current Outpatient Rx  Name  Route  Sig  Dispense  Refill  . ACETAMINOPHEN 325 MG PO TABS   Oral   Take 650 mg by mouth every 6 (six) hours as needed. For pain         . FAMOTIDINE 10 MG PO TABS      One po bid x 3 days and then prn   10 tablet   0   . HYDROCORTISONE 1 % EX CREA      Apply to affected area 2 times daily   15 g   0     BP 141/76  Pulse 90  Temp 98 F (36.7 C) (Oral)  Resp 17  SpO2 96%  LMP 06/06/2012  Breastfeeding?  No  Physical Exam  Nursing note and vitals reviewed. Constitutional: She is oriented to person, place, and time. She appears well-developed and well-nourished. No distress.  HENT:  Head: Normocephalic and atraumatic.  Eyes:       Normal appearance  Neck: Normal range of motion.  Pulmonary/Chest: Effort normal.  Musculoskeletal: Normal range of motion.  Neurological: She is alert and oriented to person, place, and time.  Skin:        Approx 5x7cm area of paraspinal macular erythema at level of scapular spine bilaterally.  Looks like a sunburn.  Non-tender; palpation induces pruritis.  Otherwise, no rash.  Psychiatric: She has a normal mood and affect. Her behavior is normal.    ED Course  Procedures (including critical care time)  Labs Reviewed - No data to display No results found.   1. Allergic reaction       MDM  Pt presents w/ pruritic/painful rash of upper back.  No trauma, no recent sun exposure, no known allergens or new contacts, no recent infectious sx.  Pt received  benadryl and pepcid which relieved the pain but not the pruritis.  Will continue to treat with these two medication at home and recommended hydrocortisone cream as well.  Return precautions discussed.         Otilio Miu, PA-C 06/30/12 [phone]

## 2012-06-30 NOTE — ED Provider Notes (Signed)
Medical screening examination/treatment/procedure(s) were performed by non-physician practitioner and as supervising physician I was immediately available for consultation/collaboration.  Sunnie Nielsen, MD 06/30/12 [phone]

## 2012-11-02 ENCOUNTER — Emergency Department (HOSPITAL_COMMUNITY): Payer: Medicaid Other

## 2012-11-02 ENCOUNTER — Inpatient Hospital Stay (HOSPITAL_COMMUNITY)
Admission: EM | Admit: 2012-11-02 | Discharge: 2012-11-04 | DRG: 645 | Disposition: A | Discharge status: Home / Self Care | Payer: Medicaid Other | Attending: Internal Medicine | Admitting: Internal Medicine

## 2012-11-02 ENCOUNTER — Encounter (HOSPITAL_COMMUNITY): Payer: Self-pay | Admitting: Emergency Medicine

## 2012-11-02 DIAGNOSIS — E0500 Thyrotoxicosis with diffuse goiter without thyrotoxic crisis or storm: Secondary | ICD-10-CM | POA: Diagnosis present

## 2012-11-02 DIAGNOSIS — F17200 Nicotine dependence, unspecified, uncomplicated: Secondary | ICD-10-CM | POA: Diagnosis present

## 2012-11-02 DIAGNOSIS — E876 Hypokalemia: Secondary | ICD-10-CM | POA: Diagnosis present

## 2012-11-02 DIAGNOSIS — Z9089 Acquired absence of other organs: Secondary | ICD-10-CM

## 2012-11-02 DIAGNOSIS — Z23 Encounter for immunization: Secondary | ICD-10-CM

## 2012-11-02 DIAGNOSIS — E0590 Thyrotoxicosis, unspecified without thyrotoxic crisis or storm: Principal | ICD-10-CM

## 2012-11-02 DIAGNOSIS — K921 Melena: Secondary | ICD-10-CM | POA: Diagnosis present

## 2012-11-02 DIAGNOSIS — D638 Anemia in other chronic diseases classified elsewhere: Secondary | ICD-10-CM | POA: Diagnosis present

## 2012-11-02 DIAGNOSIS — R000 Tachycardia, unspecified: Secondary | ICD-10-CM | POA: Diagnosis present

## 2012-11-02 DIAGNOSIS — K649 Unspecified hemorrhoids: Secondary | ICD-10-CM | POA: Diagnosis present

## 2012-11-02 DIAGNOSIS — K6289 Other specified diseases of anus and rectum: Secondary | ICD-10-CM | POA: Diagnosis present

## 2012-11-02 DIAGNOSIS — D649 Anemia, unspecified: Secondary | ICD-10-CM | POA: Diagnosis present

## 2012-11-02 DIAGNOSIS — Z8611 Personal history of tuberculosis: Secondary | ICD-10-CM

## 2012-11-02 DIAGNOSIS — R109 Unspecified abdominal pain: Secondary | ICD-10-CM | POA: Diagnosis present

## 2012-11-02 LAB — COMPREHENSIVE METABOLIC PANEL
AST: 27 U/L (ref 0–37)
BUN: 7 mg/dL (ref 6–23)
CO2: 23 mEq/L (ref 19–32)
Calcium: 9.1 mg/dL (ref 8.4–10.5)
Creatinine, Ser: 0.31 mg/dL — ABNORMAL LOW (ref 0.50–1.10)
GFR calc Af Amer: >90 mL/min (ref >90)
GFR calc non Af Amer: >90 mL/min (ref >90)
Glucose, Bld: 115 mg/dL — ABNORMAL HIGH (ref 70–99)
Total Bilirubin: 0.4 mg/dL (ref 0.3–1.2)

## 2012-11-02 LAB — POCT PREGNANCY, URINE: Preg Test, Ur: NEGATIVE

## 2012-11-02 LAB — URINALYSIS, ROUTINE W REFLEX MICROSCOPIC
Glucose, UA: NEGATIVE mg/dL
Hgb urine dipstick: NEGATIVE
Ketones, ur: NEGATIVE mg/dL
Protein, ur: NEGATIVE mg/dL

## 2012-11-02 LAB — LACTIC ACID, PLASMA: Lactic Acid, Venous: 1 mmol/L (ref 0.5–2.2)

## 2012-11-02 LAB — CBC WITH DIFFERENTIAL/PLATELET
Basophils Absolute: 0 10*3/uL (ref 0.0–0.1)
Eosinophils Relative: 1 % (ref 0–5)
HCT: 32.3 % — ABNORMAL LOW (ref 36.0–46.0)
Hemoglobin: 12.1 g/dL (ref 12.0–15.0)
Lymphocytes Relative: 34 % (ref 12–46)
MCV: 75.3 fL — ABNORMAL LOW (ref 78.0–100.0)
Monocytes Absolute: 0.8 10*3/uL (ref 0.1–1.0)
Monocytes Relative: 10 % (ref 3–12)
RDW: 13.9 % (ref 11.5–15.5)
WBC: 7.9 10*3/uL (ref 4.0–10.5)

## 2012-11-02 LAB — LIPASE, BLOOD: Lipase: 18 U/L (ref 11–59)

## 2012-11-02 MED ORDER — IOHEXOL 300 MG/ML  SOLN
50.0000 mL | Freq: Once | INTRAMUSCULAR | Status: AC | PRN
  Administered 2012-11-02: 50 mL via ORAL

## 2012-11-02 MED ORDER — HYDROMORPHONE HCL PF 1 MG/ML IJ SOLN
1.0000 mg | Freq: Once | INTRAMUSCULAR | Status: AC
  Administered 2012-11-02: 1 mg via INTRAVENOUS
  Filled 2012-11-02: qty 1

## 2012-11-02 MED ORDER — SODIUM CHLORIDE 0.9 % IV SOLN
1000.0000 mL | Freq: Once | INTRAVENOUS | Status: AC
  Administered 2012-11-02: 1000 mL via INTRAVENOUS

## 2012-11-02 MED ORDER — ONDANSETRON HCL 4 MG/2ML IJ SOLN
4.0000 mg | Freq: Once | INTRAMUSCULAR | Status: AC
  Administered 2012-11-02: 4 mg via INTRAVENOUS
  Filled 2012-11-02: qty 2

## 2012-11-02 MED ORDER — IOHEXOL 300 MG/ML  SOLN
100.0000 mL | Freq: Once | INTRAMUSCULAR | Status: AC | PRN
  Administered 2012-11-02: 100 mL via INTRAVENOUS

## 2012-11-02 MED ORDER — SODIUM CHLORIDE 0.9 % IV SOLN
1000.0000 mL | INTRAVENOUS | Status: DC
  Administered 2012-11-02: 1000 mL via INTRAVENOUS

## 2012-11-02 NOTE — ED Notes (Signed)
Pt c/o pain, inflammation and bld to rectum. Pt sts she hasn't had a BM in 2 days. Pt vomited X 3 today. Pt c/o bilateral lower extremity pain.

## 2012-11-02 NOTE — ED Provider Notes (Signed)
History     CSN: [id number]  Arrival date & time 11/02/12  1904   First MD Initiated Contact with Patient 11/02/12 2034      Chief Complaint  Patient presents with  . Constipation  . Emesis    (Consider location/radiation/quality/duration/timing/severity/associated sxs/prior treatment) The history is provided by the patient. No language interpreter was used.    32 year old female with a past medical history of abdominal surgeries presents to the emergency department with chief complaint of rectal  pain, nausea, constipation and vomiting.  Onset 2 days ago.  Patient states she has not been able to make a bowel movement since.  She has tried Dulcolax and rectal suppositories without relief.  She states that she has severe rectal pain and is unable to sit on her bottom.  She also states she has some blood with wiping.  Patient states today she will vomited twice and has had nausea.patient denies any diarrhea, she denies any contacts with similar symptoms, denies any fevers, chills, headache. She is associated lower leg myalgias. She has no similar previous symptoms.  No recent foreign travel. Patient denies any urinary or vaginal symptoms, no previous history of kidney stones.   Past Medical History  Diagnosis Date  . 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 Cholecystectomy    . Cesarean section  07/02/2011    Procedure: CESAREAN SECTION;  Surgeon: Roseanna Rainbow, MD;  Location: [facility];  Service: Gynecology;  Laterality: N/A;    No family history on file.  History  Substance Use Topics  . Smoking status: Current Some Day Smoker  . Smokeless tobacco: Never Used  . Alcohol Use: No    OB History   Grav Para Term Preterm Abortions TAB SAB Ect Mult Living   4 4 4       3       Review of Systems Ten systems reviewed and are negative for acute change, except as noted in the HPI.   Allergies  Review of patient's allergies  indicates no known allergies.  Home Medications   Current Outpatient Rx  Name  Route  Sig  Dispense  Refill  . ibuprofen (ADVIL,MOTRIN) 200 MG tablet   Oral   Take 400 mg by mouth every 6 (six) hours as needed for pain.           BP 135/73  Pulse 128  Temp(Src) 98.5 F (36.9 C) (Oral)  Resp 21  SpO2 100%  LMP 10/04/2012  Physical Exam Physical Exam  Nursing note and vitals reviewed. Constitutional: She is oriented to person, place, and time. Patient is sitting on her left hip.  She appears extremely uncomfortable.She does not appear toxic. HENT:  Head: Normocephalic and atraumatic.  Eyes: Conjunctivae injected and EOM are normal. Pupils are equal, round, and reactive to light. No scleral icterus.  Neck: Normal range of motion.  Cardiovascular:tachycardic to 128 regular rhythm and normal heart sounds.  Exam reveals no gallop and no friction rub.   No murmur heard. Pulmonary/Chest: Effort normal and breath sounds normal. No respiratory distress.  Abdominal: Soft. Bowel sounds are decreased. She exhibits no distension and no mass.  She is exquisitely tender to palpation in the right lower quadrant. Digital Rectal Exam reveals sphincter with good tone.nonthrombosed external hemorrhoids. No masses or fissures.she has surrounding erythema.Stool color is brown with no overt blood. Neurological: She is alert and oriented to person, place, and time.  Skin:  Skin is warm and dry. She is not diaphoretic.    ED Course  Procedures (including critical care time)  Labs Reviewed  CBC WITH DIFFERENTIAL - Abnormal; Notable for the following:    HCT 32.3 (*)    MCV 75.3 (*)    MCHC 37.5 (*)    All other components within normal limits  COMPREHENSIVE METABOLIC PANEL - Abnormal; Notable for the following:    Potassium 3.4 (*)    Glucose, Bld 115 (*)    Creatinine, Ser 0.31 (*)    Albumin 3.4 (*)    Alkaline Phosphatase 236 (*)    All other components within normal limits  BASIC  METABOLIC PANEL - Abnormal; Notable for the following:    Glucose, Bld 104 (*)    Creatinine, Ser 0.33 (*)    All other components within normal limits  CBC - Abnormal; Notable for the following:    RBC 3.71 (*)    Hemoglobin 10.4 (*)    HCT 28.5 (*)    MCV 76.8 (*)    MCHC 36.5 (*)    All other components within normal limits  TSH - Abnormal; Notable for the following:    TSH <0.008 (*)    All other components within normal limits  PROTIME-INR - Abnormal; Notable for the following:    Prothrombin Time 16.7 (*)    All other components within normal limits  APTT - Abnormal; Notable for the following:    aPTT 42 (*)    All other components within normal limits  OCCULT BLOOD, POC DEVICE - Abnormal; Notable for the following:    Fecal Occult Bld POSITIVE (*)    All other components within normal limits  GC/CHLAMYDIA PROBE AMP  URINALYSIS, ROUTINE W REFLEX MICROSCOPIC  LIPASE, BLOOD  LACTIC ACID, PLASMA  HIV ANTIBODY (ROUTINE TESTING)  URINE RAPID DRUG SCREEN (HOSP PERFORMED)  HEPATITIS C ANTIBODY  HEPATITIS B SURFACE ANTIGEN  MAGNESIUM  LACTATE DEHYDROGENASE  RETICULOCYTES  HAPTOGLOBIN  VITAMIN B12  FOLATE  IRON AND TIBC  FERRITIN  POCT PREGNANCY, URINE   No results found.   1. Abdominal pain   2. Hematochezia   3. Proctitis   4. Tachycardia       MDM  9:37 PM Filed Vitals:   11/02/12 2123  BP: 135/73  Pulse:   Temp: 98.5 F (36.9 C)  Resp: 21   The patient appears very uncomfortable.  She has mild hypokalemia slightly elevated glucose.  She is a highly elevated alkaline phosphatase.  She is exquisitely tender to palpation in the right lower quadrant, however she denies any abdominal pain without palpation and complains mostly of rectal pain.  Hemoccult card is pending. Differential diagnosis includes bowel obstruction, appendicitis, inflammatory bowel disease.  Patient denies any vaginal symptoms including discharge, bleeding, dyspareunia, abnormal periods.   I do not expect PID or ovarian torsion.  CT of the abdomen is currently pending.  Pain and nausea control initiated.   Patient seen in shared visit with Dr. Read Drivers who agrees that the patient needs admission for further workup .patient wiith negative lactic acid and no elevated white count.  She continues to be tachycardic even after 2 liters of fluid and pain control.  I have spoken with Dr. Arbutus Leas who will see the patient and evaluate for admission.            Arthor Captain, PA-C 11/03/12 1556

## 2012-11-02 NOTE — ED Notes (Signed)
Pt finished drinking 1st cup of oral contrast, CT notified.

## 2012-11-02 NOTE — ED Notes (Signed)
PT. REPORTS CONSTIPATION FOR 2 DAYS UNRELIEVED BY OTC DULCOLAX SUPP. , EMESIS TODAY AND BILATERAL LOWER LEGS PAIN .

## 2012-11-03 ENCOUNTER — Encounter (HOSPITAL_COMMUNITY): Payer: Self-pay | Admitting: Urology

## 2012-11-03 DIAGNOSIS — R109 Unspecified abdominal pain: Secondary | ICD-10-CM | POA: Diagnosis present

## 2012-11-03 DIAGNOSIS — K6289 Other specified diseases of anus and rectum: Secondary | ICD-10-CM

## 2012-11-03 DIAGNOSIS — E0590 Thyrotoxicosis, unspecified without thyrotoxic crisis or storm: Principal | ICD-10-CM

## 2012-11-03 DIAGNOSIS — K921 Melena: Secondary | ICD-10-CM

## 2012-11-03 DIAGNOSIS — R000 Tachycardia, unspecified: Secondary | ICD-10-CM

## 2012-11-03 DIAGNOSIS — E0500 Thyrotoxicosis with diffuse goiter without thyrotoxic crisis or storm: Secondary | ICD-10-CM | POA: Diagnosis present

## 2012-11-03 DIAGNOSIS — D649 Anemia, unspecified: Secondary | ICD-10-CM

## 2012-11-03 LAB — RETICULOCYTES
RBC.: 3.99 MIL/uL (ref 3.87–5.11)
Retic Ct Pct: 1.9 % (ref 0.4–3.1)

## 2012-11-03 LAB — HAPTOGLOBIN: Haptoglobin: 89 mg/dL (ref 45–215)

## 2012-11-03 LAB — PROTIME-INR
INR: 1.39 (ref 0.00–1.49)
Prothrombin Time: 16.7 seconds — ABNORMAL HIGH (ref 11.6–15.2)

## 2012-11-03 LAB — IRON AND TIBC
Saturation Ratios: 42 % (ref 20–55)
TIBC: 251 ug/dL (ref 250–470)

## 2012-11-03 LAB — TSH: TSH: <0.008 u[IU]/mL — ABNORMAL LOW (ref 0.350–4.500)

## 2012-11-03 LAB — BASIC METABOLIC PANEL
CO2: 22 mEq/L (ref 19–32)
Glucose, Bld: 104 mg/dL — ABNORMAL HIGH (ref 70–99)
Potassium: 3.5 mEq/L (ref 3.5–5.1)
Sodium: 137 mEq/L (ref 135–145)

## 2012-11-03 LAB — HEPATITIS B SURFACE ANTIGEN: Hepatitis B Surface Ag: NEGATIVE

## 2012-11-03 LAB — CBC
Hemoglobin: 10.4 g/dL — ABNORMAL LOW (ref 12.0–15.0)
MCH: 28 pg (ref 26.0–34.0)
MCV: 76.8 fL — ABNORMAL LOW (ref 78.0–100.0)
Platelets: 150 10*3/uL (ref 150–400)
RBC: 3.71 MIL/uL — ABNORMAL LOW (ref 3.87–5.11)
WBC: 7.2 10*3/uL (ref 4.0–10.5)

## 2012-11-03 LAB — RAPID URINE DRUG SCREEN, HOSP PERFORMED
Cocaine: NOT DETECTED
Opiates: NOT DETECTED

## 2012-11-03 LAB — APTT: aPTT: 42 seconds — ABNORMAL HIGH (ref 24–37)

## 2012-11-03 LAB — HIV ANTIBODY (ROUTINE TESTING W REFLEX): HIV: NONREACTIVE

## 2012-11-03 MED ORDER — ATENOLOL 50 MG PO TABS
50.0000 mg | ORAL_TABLET | Freq: Every day | ORAL | Status: DC
  Administered 2012-11-03 – 2012-11-04 (×2): 50 mg via ORAL
  Filled 2012-11-03 (×2): qty 1

## 2012-11-03 MED ORDER — METHIMAZOLE 10 MG PO TABS
10.0000 mg | ORAL_TABLET | Freq: Two times a day (BID) | ORAL | Status: DC
  Administered 2012-11-03 – 2012-11-04 (×3): 10 mg via ORAL
  Filled 2012-11-03 (×5): qty 1

## 2012-11-03 MED ORDER — MEDROXYPROGESTERONE ACETATE 150 MG/ML IM SUSP
150.0000 mg | Freq: Once | INTRAMUSCULAR | Status: AC
  Administered 2012-11-03: 150 mg via INTRAMUSCULAR
  Filled 2012-11-03: qty 1

## 2012-11-03 MED ORDER — POLYSACCHARIDE IRON COMPLEX 150 MG PO CAPS
150.0000 mg | ORAL_CAPSULE | Freq: Every day | ORAL | Status: DC
  Administered 2012-11-03 – 2012-11-04 (×2): 150 mg via ORAL
  Filled 2012-11-03 (×2): qty 1

## 2012-11-03 MED ORDER — SODIUM CHLORIDE 0.9 % IJ SOLN
3.0000 mL | Freq: Two times a day (BID) | INTRAMUSCULAR | Status: DC
  Administered 2012-11-03: 3 mL via INTRAVENOUS

## 2012-11-03 MED ORDER — ACETAMINOPHEN 650 MG RE SUPP: 650.0000 mg | Freq: Four times a day (QID) | RECTAL | Status: DC | PRN

## 2012-11-03 MED ORDER — HYDROMORPHONE HCL PF 1 MG/ML IJ SOLN: 0.5000 mg | INTRAMUSCULAR | Status: DC | PRN

## 2012-11-03 MED ORDER — POLYETHYLENE GLYCOL 3350 17 G PO PACK
34.0000 g | PACK | Freq: Two times a day (BID) | ORAL | Status: DC
  Administered 2012-11-03 – 2012-11-04 (×3): 34 g via ORAL
  Filled 2012-11-03 (×4): qty 2

## 2012-11-03 MED ORDER — HYDROCORTISONE ACETATE 25 MG RE SUPP: 25.0000 mg | Freq: Two times a day (BID) | RECTAL | Status: DC | PRN

## 2012-11-03 MED ORDER — SODIUM CHLORIDE 0.9 % IV SOLN
INTRAVENOUS | Status: DC
  Administered 2012-11-03: 04:00:00 via INTRAVENOUS
  Filled 2012-11-03 (×2): qty 1000

## 2012-11-03 MED ORDER — ACETAMINOPHEN 325 MG PO TABS: 650.0000 mg | ORAL_TABLET | Freq: Four times a day (QID) | ORAL | Status: DC | PRN

## 2012-11-03 NOTE — ED Notes (Signed)
Admitting MD at bedside.

## 2012-11-03 NOTE — ED Provider Notes (Signed)
Medical screening examination/treatment/procedure(s) were performed by non-physician practitioner and as supervising physician I was immediately available for consultation/collaboration.  Juliet Rude. Rubin Payor, MD 11/03/12 2141

## 2012-11-03 NOTE — Progress Notes (Signed)
Interpreter Wyvonnia Dusky for Dr Lavera Guise.

## 2012-11-03 NOTE — Progress Notes (Signed)
INITIAL NUTRITION ASSESSMENT  DOCUMENTATION CODES Per approved criteria  -Not Applicable   INTERVENTION: 1. Encouraged pt to continue eating meals and snacks. Encouraged a healthy diet.  NUTRITION DIAGNOSIS: Involuntary weight loss related to unknown etology as evidenced by reported 50 lb weight loss in 7 months.   Goal: Pt to meet >/= 90% of their estimated nutrition needs.  Monitor:  Weight, labs, MD notes, po intake  Reason for Assessment: MST  32 y.o. female  Admitting Dx: Hyperthyroidism  ASSESSMENT: Pt was admitted with a two-day history of constipation and right lower quadrant abdominal pain as well as rectal pain. She reports that she has lost a lot of weight over the past 7 months. She says that she has lost about 50 lbs and that she has been sweating and her heart has been racing. She says that she has been eating healthy and has had a very good appetite. She has not been any more active since her weight loss began, and she did not breastfeed her child after the first month. She does not wish to gain weight, but she is concerned about losing so much without trying. Weight loss at this time is of unknown etiology.   Height: Ht Readings from Last 1 Encounters:  11/03/12 5\' 3"  (1.6 m)    Weight: Wt Readings from Last 1 Encounters:  11/03/12 137 lb 9.1 oz (62.4 kg)    Ideal Body Weight: 52.4 kg  % Ideal Body Weight: 119%  Wt Readings from Last 10 Encounters:  11/03/12 137 lb 9.1 oz (62.4 kg)  07/01/11 180 lb (81.647 kg)  07/01/11 180 lb (81.647 kg)    Usual Body Weight: 170 lbs  % Usual Body Weight: 80%  BMI:  Body mass index is 24.38 kg/(m^2).  Estimated Nutritional Needs: Kcal: 1800-2100 Protein: 60-80 g Fluid: >2.1 L  Skin: WNL  Diet Order: General  EDUCATION NEEDS: -Education needs addressed   Intake/Output Summary (Last 24 hours) at 11/03/12 1231 Last data filed at 11/03/12 0900  Gross per 24 hour  Intake    300 ml  Output    350 ml   Net    -50 ml    Last BM: 4/11   Labs:   Recent Labs Lab 11/02/12 1913 11/03/12 0550  NA 139 137  K 3.4* 3.5  CL 107 109  CO2 23 22  BUN 7 8  CREATININE 0.31* 0.33*  CALCIUM 9.1 9.2  MG  --  1.7  GLUCOSE 115* 104*    CBG (last 3)  No results found for this basename: GLUCAP,  in the last 72 hours  Scheduled Meds: . atenolol  50 mg Oral Daily  . iron polysaccharides  150 mg Oral Daily  . medroxyPROGESTERone  150 mg Intramuscular Once  . methimazole  10 mg Oral BID  . polyethylene glycol  34 g Oral BID  . sodium chloride  3 mL Intravenous Q12H    Continuous Infusions:   Past Medical History  Diagnosis Date  . 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 Cholecystectomy    . Cesarean section  07/02/2011    Procedure: CESAREAN SECTION;  Surgeon: Roseanna Rainbow, MD;  Location: [facility];  Service: Gynecology;  Laterality: N/A;    Ebbie Latus RD, LDN

## 2012-11-03 NOTE — H&P (Addendum)
[facility] History and Physical  Tracy Carson ZOX:[id number] DOB: 1980-09-22 DOA: 11/02/2012   PCP: Default, Provider, MD   Chief Complaint: Lower quadrant abdominal pain, nausea, vomiting  HPI:  32 year old female with no major medical problems presents with two-day history of constipation and right lower quadrant abdominal pain as well as rectal pain. The patient states that she had to use a Dulcolax suppository to assist with a bowel movement on Wednesday and Thursday of this past week. Normally, the patient is able to have bowel movements without any difficulties. She denies any fevers, chills, dysuria, hematuria, rashes. She also noted some blood in her stool the day prior to admission; however, the patient also had a bowel movement in the emergency department but did not note any blood at that time. The patient was given Dilaudid in the emergency department with significant improvement of her right lower quadrant pain. CT of the abdomen and pelvis in the ED showed mild intrahepatic and extrahepatic biliary ductal dilatation status post cholecystectomy. There was mild splenomegaly. The patient had 2 episodes of emesis prior to admission. She has not had any emesis since coming to the emergency department. Otherwise, on the CT abd, appendix was normal and there was no other intraabdominal findings. The patient also complained of some dizziness for the past 2 days. She denies any chest discomfort, shortness of breath, coughing, hemoptysis, syncope. Prior to this past 2 days, the patient has not had any problems with her bowel movements.  Pt denies inserting any foreign objects into her rectum except the dulculax suppository.   In emergency department, the patient was noted to be tachycardic initially up to128. The patient was given intravenous fluids with improvement of her heart rate into the 115 range. The patient continued to complain of significant rectal pain. Because of her continued  tachycardia and rectal pain, observation admission was advised. Lactic acid was 1.0. Lipase was 18. Urine pregnancy test was negative. WBC 7.9. BMP noted slightly low potassium, 3.4. LFTs showed alkaline phosphatase is 236.     Assessment/Plan:  abdominal/rectal pain  -My personal examination in the presence of a chaperone revealed some erythema surrounding the patient's rectum with significant pain on rectal examination. Some nodularity note on posterior rectal vault on exam. No frank hematochezia or melena noted.  -My personal hemoccult was weakly positive -Concerned about proctitis--question inflammatory bowel disease  -May need GI evaluation--please consult in am  -Will hold off on hydrocortisone suppositories until seen by GI -Check HIV test  -Check urine drug screen  -Check TSH  Rectal bleeding -Suspect related to proctitis versus hemorrhoids -Check coags  tachycardia -Likely related to the patient's pain as well as her proctitis -Continue IV fluids -Check TSH -May need echocardiogram if no improvement -Obtain EKG Hypokalemia -Replete -Check magnesium Dizziness -Check orthostatics -Continue IV fluids Splenomegaly -Check HIV -Hepatitis B surface antigen, hepatitis C antibody        Past Medical History  Diagnosis Date  . Tuberculosis     as a child   Past Surgical History  Procedure Laterality Date  . Cholecystectomy    . Cesarean section  07/02/2011    Procedure: CESAREAN SECTION;  Surgeon: Roseanna Rainbow, MD;  Location: [facility];  Service: Gynecology;  Laterality: N/A;   Social History:  reports that she has been smoking.  She has never used smokeless tobacco. She reports that she does not drink alcohol or use illicit drugs.   No family history on file.   No Known Allergies  Prior to Admission medications   Medication Sig Start Date End Date Taking? Authorizing Provider  ibuprofen (ADVIL,MOTRIN) 200 MG tablet Take 400 mg by mouth every 6 (six)  hours as needed for pain.   Yes Historical Provider, MD    Review of Systems:  Constitutional:  No weight loss, night sweats, Fevers, chills, fatigue.  Head&Eyes: No headache.  No vision loss.  No eye pain or scotoma ENT:  No Difficulty swallowing,Tooth/dental problems,Sore throat,  No ear ache, post nasal drip,  Cardio-vascular:  No chest pain, Orthopnea, PND, swelling in lower extremities,palpitations  GI:  No   diarrhea, loss of appetite, hematochezia, melena, heartburn, indigestion, Resp:  No shortness of breath with exertion or at rest. No cough. No coughing up of blood .No wheezing.No chest wall deformity  Skin:  no rash or lesions.  GU:  no dysuria, change in color of urine, no urgency or frequency. No flank pain.  Musculoskeletal:  No joint pain or swelling. No decreased range of motion. No back pain.  Psych:  No change in mood or affect. No depression or anxiety. Neurologic: No headache, no dysesthesia, no focal weakness, no vision loss. No syncope  Physical Exam: Filed Vitals:   11/02/12 2257 11/02/12 2330 11/03/12 0030 11/03/12 0130  BP:  125/65 132/59 139/67  Pulse:  119 114 117  Temp: 97.3 F (36.3 C)     TempSrc: Rectal     Resp:  18 22 20   SpO2:  99% 98% 97%   General:  A&O x 3, NAD, nontoxic, pleasant/cooperative Head/Eye: No conjunctival hemorrhage, no icterus, [place]/AT, No nystagmus ENT:  No icterus,  No thrush, good dentition, no pharyngeal exudate Neck:  No masses, no lymphadenpathy, no bruits CV:  RRR, no rub, no gallop, no S3 Lung:  CTAB, good air movement, no wheeze, no rhonchi Abdomen: soft/NT, +BS, nondistended, no peritoneal signs Ext: No cyanosis, No rashes, No petechiae, No lymphangitis, No edema Rectal:--mild erythema around rectum with tenderness on rectal exam.  Some nodularity noted on posterior rectal vault.  No frank hematochezia or melena. Neuro: CNII-XII intact, strength 4/5 in bilateral upper and lower extremities, no dysmetria  Labs  on Admission:  Basic Metabolic Panel:  Recent Labs Lab 11/02/12 1913  NA 139  K 3.4*  CL 107  CO2 23  GLUCOSE 115*  BUN 7  CREATININE 0.31*  CALCIUM 9.1   Liver Function Tests:  Recent Labs Lab 11/02/12 1913  AST 27  ALT 20  ALKPHOS 236*  BILITOT 0.4  PROT 6.6  ALBUMIN 3.4*    Recent Labs Lab 11/02/12 2142  LIPASE 18   No results found for this basename: AMMONIA,  in the last 168 hours CBC:  Recent Labs Lab 11/02/12 1913  WBC 7.9  NEUTROABS 4.3  HGB 12.1  HCT 32.3*  MCV 75.3*  PLT 186   Cardiac Enzymes: No results found for this basename: CKTOTAL, CKMB, CKMBINDEX, TROPONINI,  in the last 168 hours BNP: No components found with this basename: POCBNP,  CBG: No results found for this basename: GLUCAP,  in the last 168 hours  Radiological Exams on Admission: Ct Abdomen Pelvis W Contrast  11/02/2012  *RADIOLOGY REPORT*  Clinical Data: Right lower quadrant abdominal pain.  Constipation.  CT ABDOMEN AND PELVIS WITH CONTRAST  Technique:  Multidetector CT imaging of the abdomen and pelvis was performed following the standard protocol during bolus administration of intravenous contrast.  Contrast: OMNIPAQUE IOHEXOL 300 MG/ML  SOLN  Comparison: None.  Findings: The lung  bases are clear without focal nodule, mass, or air space disease.  Heart size is normal.  There is no significant pleural or pericardial effusion.  Mild intra and extrahepatic biliary dilation is present following cholecystectomy.  No focal hepatic lesions are present.  The spleen is mildly enlarged.  The stomach, duodenum, and pancreas are within normal limits.  The adrenal glands and kidneys are normal bilaterally.  The ureters and urinary bladder are within normal limits.  Rectosigmoid colon is within normal limits.  The remainder the colon is unremarkable.  The appendix is visualized and normal.  The uterus and adnexa are unremarkable.  Minimal free fluid is likely physiologic.  The bone windows  are unremarkable.  IMPRESSION:  1.  Normal appendix. 2.  No acute or focal abnormality to explain right lower quadrant symptoms. 3.  Mild intra and extrahepatic biliary dilation following cholecystectomy.  No definite obstructing lesion is evident. 4.  Mild splenic enlargement.   Original Report Authenticated By: Marin Roberts, M.D.     EKG: Independently reviewed. pending    Time spent:70 minutes Code Status:   FULL Family Communication:   No Family at bedside   Susi Goslin, DO  [facility] Pager [phone]  If 7PM-7AM, please contact night-coverage [email or web address] Password [email or web address] 11/03/2012, 2:10 AM

## 2012-11-03 NOTE — Progress Notes (Signed)
Utilization Review Completed.   Almarosa Bohac, RN, BSN Nurse Case Manager  [phone]  

## 2012-11-03 NOTE — Progress Notes (Signed)
[facility] PROGRESS NOTE  Tracy Carson QIO:[id number] DOB: August 08, 1980 DOA: 11/02/2012 PCP: Default, Provider, MD  Assessment/Plan: 1. abdominal/rectal pain  -Dr. Don Perking  examination in the presence of a chaperone revealed some erythema surrounding the patient's rectum with significant pain on rectal examination. Some nodularity note on posterior rectal vault on exam. No frank hematochezia or melena noted.   Probably she has hemorrhoidal illness. Patient started on [facility] as needed  Results for REA, [name] (MRN [id number]) as of 11/03/2012 07:45  Ref. Range 11/03/2012 02:51  Hepatitis B Surface Ag Latest Range: NEGATIVE  NEGATIVE  HCV Ab Latest Range: NEGATIVE  NEGATIVE  HIV Latest Range: NON REACTIVE  NON REACTIVE    Rectal bleeding  Minimal-resolved with resolution of constipation   Tachycardia , tremors, nervousness, inability to sleep, weight loss TSH is undetectable Patient has new diagnosis of Graves' disease Started on atenolol 50 mg daily Started on methimazole 10 mg twice a day Patient will have a thyroid uptake scan one month after discharge     Hypokalemia  -Replete  -Magnesium within normal limits   Splenomegaly  HIV, HBV, HCV negative  Maybe from Graves' disease   Anemia-consistent with chronic illness-probably from Graves' disease     HPI/Subjective: Alert and oriented hungry  Objective: Filed Vitals:   11/03/12 0637 11/03/12 0638 11/03/12 0641 11/03/12 1403  BP: 129/50 113/81 130/66 123/54  Pulse: 114 121 116 106  Temp:  98.2 F (36.8 C)  97.8 F (36.6 C)  TempSrc:  Oral  Oral  Resp:  21  20  Height:      Weight:      SpO2:  99%  100%    Intake/Output Summary (Last 24 hours) at 11/03/12 1717 Last data filed at 11/03/12 1300  Gross per 24 hour  Intake    540 ml  Output    550 ml  Net    -10 ml   Filed Weights   11/03/12 0335  Weight: 62.4 kg (137 lb 9.1 oz)    Exam:   General:  Alert and oriented  Cardiovascular/ RS: Clear to  auscultation regular rate and rhythm  Abdomen: Soft nontender    Data Reviewed: Basic Metabolic Panel:  Recent Labs Lab 11/02/12 1913 11/03/12 0550  NA 139 137  K 3.4* 3.5  CL 107 109  CO2 23 22  GLUCOSE 115* 104*  BUN 7 8  CREATININE 0.31* 0.33*  CALCIUM 9.1 9.2  MG  --  1.7   Liver Function Tests:  Recent Labs Lab 11/02/12 1913  AST 27  ALT 20  ALKPHOS 236*  BILITOT 0.4  PROT 6.6  ALBUMIN 3.4*    Recent Labs Lab 11/02/12 2142  LIPASE 18   No results found for this basename: AMMONIA,  in the last 168 hours CBC:  Recent Labs Lab 11/02/12 1913 11/03/12 0550  WBC 7.9 7.2  NEUTROABS 4.3  --   HGB 12.1 10.4*  HCT 32.3* 28.5*  MCV 75.3* 76.8*  PLT 186 150   Cardiac Enzymes: No results found for this basename: CKTOTAL, CKMB, CKMBINDEX, TROPONINI,  in the last 168 hours BNP (last 3 results) No results found for this basename: PROBNP,  in the last 8760 hours CBG: No results found for this basename: GLUCAP,  in the last 168 hours  No results found for this or any previous visit (from the past 240 hour(s)).   Studies: Ct Abdomen Pelvis W Contrast  11/02/2012  *RADIOLOGY REPORT*  Clinical Data: Right lower quadrant abdominal  pain.  Constipation.  CT ABDOMEN AND PELVIS WITH CONTRAST  Technique:  Multidetector CT imaging of the abdomen and pelvis was performed following the standard protocol during bolus administration of intravenous contrast.  Contrast: OMNIPAQUE IOHEXOL 300 MG/ML  SOLN  Comparison: None.  Findings: The lung bases are clear without focal nodule, mass, or air space disease.  Heart size is normal.  There is no significant pleural or pericardial effusion.  Mild intra and extrahepatic biliary dilation is present following cholecystectomy.  No focal hepatic lesions are present.  The spleen is mildly enlarged.  The stomach, duodenum, and pancreas are within normal limits.  The adrenal glands and kidneys are normal bilaterally.  The ureters and  urinary bladder are within normal limits.  Rectosigmoid colon is within normal limits.  The remainder the colon is unremarkable.  The appendix is visualized and normal.  The uterus and adnexa are unremarkable.  Minimal free fluid is likely physiologic.  The bone windows are unremarkable.  IMPRESSION:  1.  Normal appendix. 2.  No acute or focal abnormality to explain right lower quadrant symptoms. 3.  Mild intra and extrahepatic biliary dilation following cholecystectomy.  No definite obstructing lesion is evident. 4.  Mild splenic enlargement.   Original Report Authenticated By: Marin Roberts, M.D.     Scheduled Meds: . atenolol  50 mg Oral Daily  . iron polysaccharides  150 mg Oral Daily  . methimazole  10 mg Oral BID  . polyethylene glycol  34 g Oral BID  . sodium chloride  3 mL Intravenous Q12H   Continuous Infusions:    Principal Problem:   Hyperthyroidism Active Problems:   Abdominal pain   Proctitis   Tachycardia   Hematochezia   Anemia     [name]  [facility] Pager [phone]. If 7PM-7AM, please contact night-coverage at [email or web address], password [facility] 11/03/2012, 5:17 PM  LOS: 1 day

## 2012-11-04 LAB — BASIC METABOLIC PANEL
CO2: 22 mEq/L (ref 19–32)
Chloride: 109 mEq/L (ref 96–112)
Creatinine, Ser: 0.34 mg/dL — ABNORMAL LOW (ref 0.50–1.10)
GFR calc Af Amer: >90 mL/min (ref >90)
Potassium: 3.2 mEq/L — ABNORMAL LOW (ref 3.5–5.1)
Sodium: 140 mEq/L (ref 135–145)

## 2012-11-04 LAB — FERRITIN: Ferritin: 235 ng/mL (ref 10–291)

## 2012-11-04 LAB — CBC
HCT: 28.9 % — ABNORMAL LOW (ref 36.0–46.0)
MCV: 76.5 fL — ABNORMAL LOW (ref 78.0–100.0)
RBC: 3.78 MIL/uL — ABNORMAL LOW (ref 3.87–5.11)
RDW: 13.9 % (ref 11.5–15.5)
WBC: 5.6 10*3/uL (ref 4.0–10.5)

## 2012-11-04 LAB — VITAMIN B12: Vitamin B-12: 1137 pg/mL — ABNORMAL HIGH (ref 211–911)

## 2012-11-04 LAB — GC/CHLAMYDIA PROBE AMP: GC Probe RNA: NEGATIVE

## 2012-11-04 MED ORDER — HYDROCORTISONE ACETATE 25 MG RE SUPP: 25.0000 mg | Freq: Two times a day (BID) | RECTAL | Status: DC | PRN

## 2012-11-04 MED ORDER — METHIMAZOLE 10 MG PO TABS: 10.0000 mg | ORAL_TABLET | Freq: Two times a day (BID) | ORAL | Status: DC

## 2012-11-04 MED ORDER — POLYSACCHARIDE IRON COMPLEX 150 MG PO CAPS: 150.0000 mg | ORAL_CAPSULE | Freq: Two times a day (BID) | ORAL | Status: DC

## 2012-11-04 MED ORDER — POLYETHYLENE GLYCOL 3350 17 G PO PACK: 17.0000 g | PACK | Freq: Every day | ORAL | Status: DC

## 2012-11-04 MED ORDER — ATENOLOL 50 MG PO TABS: 50.0000 mg | ORAL_TABLET | Freq: Every day | ORAL | Status: DC

## 2012-11-04 MED ORDER — POTASSIUM CHLORIDE CRYS ER 20 MEQ PO TBCR
40.0000 meq | EXTENDED_RELEASE_TABLET | Freq: Once | ORAL | Status: AC
  Administered 2012-11-04: 40 meq via ORAL
  Filled 2012-11-04: qty 2

## 2012-11-04 NOTE — Discharge Summary (Signed)
Physician Discharge Summary  Tracy Carson ZOX:[id number] DOB: [birth year] DOA: 11/02/2012  PCP: Patient will go to [place] urgent care  Admit date: 11/02/2012 Discharge date: 11/04/2012  Time spent: 35 minutes  Recommendations for Outpatient Follow-up:  Patient will be scheduled for a nuclear medicine thyroid uptake scan one month after receiving IV iodine contrast The patient will need CBC at followup visit for sure she does not develop pancytopenia from methimazole Discharge Diagnoses:  Hyperthyroidism -    Tachycardia   Hematochezia - probable hemorrhoids   Anemia   Discharge Condition: Good  Diet recommendation: Regular diet  Filed Weights   11/03/12 0335 11/04/12 0447  Weight: 62.4 kg (137 lb 9.1 oz) 61.735 kg (136 lb 1.6 oz)    History of present illness:  32 year old patient who presented to the emergency room with complaints of painful rectal bleeding. She also has lost 40 pounds, was unable to sleep, and was feeling poorly for the last year.  Hospital Course:  1. hematochezia-patient reports that she was constipated and bled when the hard stool passed. The hematochezia was minimal and painful suggestive of hemorrhoidal disease. Patient and was checked for HIV hepatitis B and C. and the serology was negative. Gonorrhea Chlamydia was negative as well. Patient denies any sexual transmitted diseases. We suspected the hematochezia was from hemorrhoids and we prescribed Anusol suppositories. 2. tachycardia, inability to sleep, 40 pound weight loss despite eating-TSH was undetectable. Patient is newly diagnosed hyperthyroidism. A nuclear medicine thyroid uptake scan could not be done due to recent CT abdomen and pelvis with IV contrast. Plan to use methimazole and atenolol, obtain an echo medicine thyroid scan in one month and then followup with endocrinology 3. anemia-probably from thyroid disease as workup does not identify any hemolysis, iron deficiency, B12 deficiency, folic acid  deficiency  Procedures:  None (i.e. Studies not automatically included, echos, thoracentesis, etc; not x-rays)  Consultations:  None  Discharge Exam: Filed Vitals:   11/03/12 0641 11/03/12 1403 11/03/12 2027 11/04/12 0447  BP: 130/66 123/54 102/59 132/65  Pulse: 116 106 99 96  Temp:  97.8 F (36.6 C) 97 F (36.1 C) 98.5 F (36.9 C)  TempSrc:  Oral Oral Oral  Resp:  20 20 19   Height:      Weight:    61.735 kg (136 lb 1.6 oz)  SpO2:  100% 100% 97%    General: Alert and oriented x3 Cardiovascular: Regular rate and rhythm without murmurs rubs or gallops Respiratory: Clear to auscultation bilaterally  Discharge Instructions  Discharge Orders   Future Appointments Provider Department Dept Phone   12/12/2012 9:30 AM Carlus Pavlov, MD [facility] PR[facility] ENDOCRINOLOGY [phone]   Future Orders Complete By Expires     Diet general  As directed     Increase activity slowly  As directed         Medication List    STOP taking these medications       ibuprofen 200 MG tablet  Commonly known as:  ADVIL,MOTRIN      TAKE these medications       atenolol 50 MG tablet  Commonly known as:  TENORMIN  Take 1 tablet (50 mg total) by mouth daily.     hydrocortisone 25 MG suppository  Commonly known as:  ANUSOL-HC  Place 1 suppository (25 mg total) rectally 2 (two) times daily as needed for hemorrhoids.     iron polysaccharides 150 MG capsule  Commonly known as:  NIFEREX  Take 1 capsule (150 mg total) by  mouth 2 (two) times daily.     methimazole 10 MG tablet  Commonly known as:  TAPAZOLE  Take 1 tablet (10 mg total) by mouth 2 (two) times daily.     polyethylene glycol packet  Commonly known as:  MIRALAX / GLYCOLAX  Take 17 g by mouth daily.           Follow-up Information   Follow up with Carlus Pavlov, MD On 12/12/2012. (9:30 AM)    Contact information:   [address] [place] [address] [phone]        The results of  significant diagnostics from this hospitalization (including imaging, microbiology, ancillary and laboratory) are listed below for reference.    Significant Diagnostic Studies: Ct Abdomen Pelvis W Contrast  11/02/2012  *RADIOLOGY REPORT*  Clinical Data: Right lower quadrant abdominal pain.  Constipation.  CT ABDOMEN AND PELVIS WITH CONTRAST  Technique:  Multidetector CT imaging of the abdomen and pelvis was performed following the standard protocol during bolus administration of intravenous contrast.  Contrast: OMNIPAQUE IOHEXOL 300 MG/ML  SOLN  Comparison: None.  Findings: The lung bases are clear without focal nodule, mass, or air space disease.  Heart size is normal.  There is no significant pleural or pericardial effusion.  Mild intra and extrahepatic biliary dilation is present following cholecystectomy.  No focal hepatic lesions are present.  The spleen is mildly enlarged.  The stomach, duodenum, and pancreas are within normal limits.  The adrenal glands and kidneys are normal bilaterally.  The ureters and urinary bladder are within normal limits.  Rectosigmoid colon is within normal limits.  The remainder the colon is unremarkable.  The appendix is visualized and normal.  The uterus and adnexa are unremarkable.  Minimal free fluid is likely physiologic.  The bone windows are unremarkable.  IMPRESSION:  1.  Normal appendix. 2.  No acute or focal abnormality to explain right lower quadrant symptoms. 3.  Mild intra and extrahepatic biliary dilation following cholecystectomy.  No definite obstructing lesion is evident. 4.  Mild splenic enlargement.   Original Report Authenticated By: Marin Roberts, M.D.     Microbiology: No results found for this or any previous visit (from the past 240 hour(s)).   Labs: Basic Metabolic Panel:  Recent Labs Lab 11/02/12 1913 11/03/12 0550 11/04/12 0500  NA 139 137 140  K 3.4* 3.5 3.2*  CL 107 109 109  CO2 23 22 22   GLUCOSE 115* 104* 102*  BUN 7 8  9   CREATININE 0.31* 0.33* 0.34*  CALCIUM 9.1 9.2 9.3  MG  --  1.7  --    Liver Function Tests:  Recent Labs Lab 11/02/12 1913  AST 27  ALT 20  ALKPHOS 236*  BILITOT 0.4  PROT 6.6  ALBUMIN 3.4*    Recent Labs Lab 11/02/12 2142  LIPASE 18   No results found for this basename: AMMONIA,  in the last 168 hours CBC:  Recent Labs Lab 11/02/12 1913 11/03/12 0550 11/04/12 0500  WBC 7.9 7.2 5.6  NEUTROABS 4.3  --   --   HGB 12.1 10.4* 10.7*  HCT 32.3* 28.5* 28.9*  MCV 75.3* 76.8* 76.5*  PLT 186 150 152   Cardiac Enzymes: No results found for this basename: CKTOTAL, CKMB, CKMBINDEX, TROPONINI,  in the last 168 hours BNP: BNP (last 3 results) No results found for this basename: PROBNP,  in the last 8760 hours CBG: No results found for this basename: GLUCAP,  in the last 168  hours     Signed:  Atira Borello  [facility] 11/04/2012, 8:39 AM

## 2012-11-04 NOTE — Progress Notes (Signed)
Interpreter Wyvonnia Dusky for Caryl Asp care management.

## 2012-11-04 NOTE — Progress Notes (Signed)
Pt. D/c instructions given, interpreter at the bedside. Verbalized understanding. Prescriptions given.

## 2012-11-05 NOTE — Progress Notes (Signed)
   CARE MANAGEMENT NOTE 11/05/2012  Patient:  Tracy Carson, Tracy Carson   Account Number:  [id number]  Date Initiated:  11/05/2012  Documentation initiated by:  [facility]  Subjective/Objective Assessment:     Action/Plan:   Anticipated DC Date:  11/05/2012   Anticipated DC Plan:  HOME/SELF CARE      DC Planning Services  CM consult  Medication Assistance      Choice offered to / List presented to:             Status of service:  Completed, signed off Medicare Important Message given?   (If response is "NO", the following Medicare IM given date fields will be blank) Date Medicare IM given:   Date Additional Medicare IM given:    Discharge Disposition:  HOME/SELF CARE  Per UR Regulation:    If discussed at Long Length of Stay Meetings, dates discussed:    Comments:  11/04/2012 1030 NCM spoke to pt and states she works and her husband works. Contacted [employer] and Tapazole is $41 and Tenormin $4. Pt states they can afford meds at [facility]. Isidoro Donning RN CCM Case Mgmt phone [phone]

## 2012-11-06 ENCOUNTER — Telehealth: Payer: Self-pay | Admitting: Internal Medicine

## 2012-11-06 DIAGNOSIS — E0590 Thyrotoxicosis, unspecified without thyrotoxic crisis or storm: Secondary | ICD-10-CM

## 2012-11-15 ENCOUNTER — Telehealth: Payer: Self-pay | Admitting: Internal Medicine

## 2012-11-15 NOTE — Telephone Encounter (Signed)
Appointment for f/u scheduled.

## 2012-12-12 ENCOUNTER — Encounter: Payer: Self-pay | Admitting: Internal Medicine

## 2012-12-12 ENCOUNTER — Ambulatory Visit (INDEPENDENT_AMBULATORY_CARE_PROVIDER_SITE_OTHER): Payer: Medicaid Other | Admitting: Internal Medicine

## 2012-12-12 VITALS — BP 138/73 | HR 127 | Temp 98.5°F | Resp 16 | Ht 63.5 in | Wt 140.0 lb

## 2012-12-12 DIAGNOSIS — E0590 Thyrotoxicosis, unspecified without thyrotoxic crisis or storm: Secondary | ICD-10-CM

## 2012-12-12 MED ORDER — METHIMAZOLE 10 MG PO TABS: 10.0000 mg | ORAL_TABLET | Freq: Two times a day (BID) | ORAL | Status: DC

## 2012-12-12 MED ORDER — ATENOLOL 50 MG PO TABS: 50.0000 mg | ORAL_TABLET | Freq: Two times a day (BID) | ORAL | Status: DC

## 2012-12-12 NOTE — Patient Instructions (Addendum)
Please restart the Atenolol and take 50 mg twice a day. Please restart the Methimazole 10 mg twice a day. Your appointments in Nuclear Medicine are on: 05/29 and 30/2014. Please do not take the methimazole on the 27, 28, 29, but restart it right after the test on 12/22/2012. Please stop the Methimazole (Tapazole) and call us or your primary care doctor if you develop: - sore throat - fever - yellow skin - dark urine - light colored stools - abdominal pain - extreme fatigue As we will then need to check your blood counts and liver tests. Please join MyChart.

## 2012-12-12 NOTE — Progress Notes (Signed)
Subjective:     Patient ID: Tracy Carson, female   DOB: [birth year], 32 y.o.   MRN: [id number]  HPI Ms. Verline Lema is a 32 y/o Spanish woman, referred by Dr. Lavera Guise with [facility], for evaluation and tx of thyrotoxicosis.  She started to have thyrotoxic symptoms in 2012 before her pregnancy: tremors, sweating, palpitations, insomnia. She now has a 32 y/o girl (had C-section), and her symptoms persist. She also has an increased appetite, and eats "all the time", even at night. She has hair falling. She has heat intolerance and is sweating most of the time. She initially gained weight during her pregnancy: 170 lbs pre-pregnancy weight >> 180 lbs with her pregnancy >> post-pregnancy lost to 136 lbs >> now 140 lbs.   She was admitted 11/02/2012 for abdominal pain, rectal bleeding (hemorrhoids) but was found to have tachycardia, tremors, nervousness, wt loss of 50 lbs in previous 7 months and a TSH was checked >> returned undetectable (<0.008). She was then started on MMI 10 mg bid and Atenolol 50 mg daily and was discharged with plan for an uptake and scan (which she will have on 12/22/2012) and a follow-up appt with me.   Pt tells me that she started Atenolol and she continued it until 3 days before this visit. She ran out of MMI 3 days prior to our appt., so she is not on any of the 2 medications now. With the Atenolol, she noticed that her heart was still very fast, in the low 100s. A good sign that she may be improving is that she gained 4 pounds since her hospitalization.  She has FH of goiter and hyperthyroidism in her grandmother.   Review of Systems See above + Constitutional: + weight loss, no fatigue, + subjective hyperthermia Eyes: no blurry vision, no xerophthalmia ENT: no sore throat, no nodules palpated in throat, no dysphagia/odynophagia, no hoarseness Cardiovascular: no CP/SOB/palpitations/leg swelling Respiratory: no cough/SOB Gastrointestinal: no N/V/D/C, + GERD Musculoskeletal:  no muscle/joint aches Skin: no rashes Neurological: no tremors/numbness/tingling/dizziness Psychiatric: no depression/anxiety  Past Medical History  Diagnosis Date  . Tuberculosis     as a child  . Thyroid disease    Past Surgical History  Procedure Laterality Date  . Cholecystectomy    . Cesarean section  07/02/2011    Procedure: CESAREAN SECTION;  Surgeon: Roseanna Rainbow, MD;  Location: [facility];  Service: Gynecology;  Laterality: N/A;   History   Social History  . Marital Status: Single    Spouse Name: N/A    Number of Children: 4   Occupational History  . Stay at home mom   Social History Main Topics  . Smoking status: Current Some Day Smoker -- 0.25 packs/day for 1 years    Types: Cigarettes  . Smokeless tobacco: Never Used  . Alcohol Use: No  . Drug Use: No  . Sexually Active: Yes    Birth Control/ Protection: Condom   Current Outpatient Prescriptions on File Prior to Visit  Medication Sig Dispense Refill  . hydrocortisone (ANUSOL-HC) 25 MG suppository Place 1 suppository (25 mg total) rectally 2 (two) times daily as needed for hemorrhoids.  12 suppository  0  . iron polysaccharides (NIFEREX) 150 MG capsule Take 1 capsule (150 mg total) by mouth 2 (two) times daily.  60 capsule  0  . polyethylene glycol (MIRALAX / GLYCOLAX) packet Take 17 g by mouth daily.  14 each     No current facility-administered medications on file prior to visit.  No Known Allergies  Family History  Problem Relation Age of Onset  . Diabetes Mother   . Thyroid disease Paternal Grandmother    Objective:   Physical Exam BP 138/73  Pulse 127  Temp(Src) 98.5 F (36.9 C) (Oral)  Resp 16  Ht 5' 3.5" (1.613 m)  Wt 140 lb (63.504 kg)  BMI 24.41 kg/m2  SpO2 98%  LMP 10/03/2012  Breastfeeding? No Wt Readings from Last 3 Encounters:  12/12/12 140 lb (63.504 kg)  11/04/12 136 lb 1.6 oz (61.735 kg)  07/01/11 180 lb (81.647 kg)   Constitutional: slightly overweight,  tremulous Eyes: PERRLA, EOMI, no exophthalmos, no lid lag, no stare ENT: moist mucous membranes, + thyromegaly R>L, no thyroid bruits heard, no cervical lymphadenopathy Cardiovascular: tachycardia, regular rhythm, No MRG Respiratory: CTA B Gastrointestinal: abdomen soft, NT, ND, BS+ Musculoskeletal: no deformities, strength intact in all 4 Skin: moist, warm, no rashes, multiple tattoos Neurological: ++ tremor with outstretched hands, DTR normal in all 4  Assessment:     1. Thyrotoxicosis     Plan:     Pt with signs of thyrotoxicosis, off methimazole for 3 days, as she misunderstood that she needs to be off the medication before she sees me, not before her thyroid uptake and scan. Unfortunately, she also stopped her beta blocker 3 days before this appointment. Subsequently, her pulse is 127 today. - Since patient tells me that even on the atenolol her pulse stays higher than 100 beats per minute, I recommended that she restart the atenolol immediately, if she increases to 50 mg twice a day   - I also advised her to restart the methimazole now, at 10 mg twice a day, and then to stop it 3 days prior to her uptake and scan, which is scheduled for 12/22/2012. - she will restart the methimazole immediately after the scan. - We discussed about methimazole side effects, and I gave her a list of signs or symptoms that would trigger stopping the methimazole and calling us (please see patient instructions) - We discussed about possible etiologies for her thyrotoxicosis, including thyroiditis (very unlikely in this case with a protracted course), toxic multinodular goiter (also fairly unlikely due to the severity of the thyrotoxicosis and also the protracted course). I believe that the most likely etiology is Graves disease. - we also discussed about possibilities of treatment for Graves' disease, and I explained that I do not recommend radioactive iodine treatment for now for her, see she is so  hyperthyroid. She has 4 small children, so it would be fairly difficult to separate them for few days. She tells me that she could arrange for this, though. I would continue with the methimazole and might reconsider radioactive iodine in the future, when her thyrotoxicosis improves. - I gave her refills for methimazole and atenolol - I will check a TSH, free T4, free T3, and TSI - I will see her back in a month   Pt did not stop for labs.  Thyroid Uptake and scan (12/22/2012): RADIOLOGY REPORT*  Clinical Data: Hyperthyroidism. TSH levels less than 0.008.   THYROID SCAN AND UPTAKE - 24 HOURS  Technique: Following the per oral administration of I-131 sodium  iodide, the patient returned at 24 hours and uptake measurements  were acquired with the uptake probe centered on the neck. Thyroid  imaging was performed following the intravenous administration of  the Tc-57m Pertechnetate.  Radiopharmaceuticals: 13.5 uCi I-131 Sodium Iodide and 10.9 mCi TC-  88m Pertechnetate  Comparison: None.  Findings: 24 thyroid uptake measured 77.8% (normal is 10-30%).  Thyroid imaging demonstrates marked diffuse gland enlargement and  increased activity throughout the entire gland. No  hyperfunctioning or hypofunctioning nodules are identified.   IMPRESSION:  1. Markedly elevated 24 thyroid uptake of 77.8%.  2. Imaging findings consistent with Graves' disease.  The patient would be a candidate for radioactive iodine therapy as  long she is not currently pregnant.   Original Report Authenticated By: Hulan Saas, M.D.

## 2012-12-21 ENCOUNTER — Encounter (HOSPITAL_COMMUNITY)
Admission: RE | Admit: 2012-12-21 | Discharge: 2012-12-21 | Disposition: A | Discharge status: Home / Self Care | Payer: Self-pay | Source: Ambulatory Visit | Attending: Internal Medicine | Admitting: Internal Medicine

## 2012-12-21 DIAGNOSIS — E0590 Thyrotoxicosis, unspecified without thyrotoxic crisis or storm: Secondary | ICD-10-CM | POA: Insufficient documentation

## 2012-12-22 ENCOUNTER — Encounter (HOSPITAL_COMMUNITY)
Admission: RE | Admit: 2012-12-22 | Discharge: 2012-12-22 | Disposition: A | Discharge status: Home / Self Care | Payer: Self-pay | Source: Ambulatory Visit | Attending: Internal Medicine | Admitting: Internal Medicine

## 2012-12-22 MED ORDER — SODIUM PERTECHNETATE TC 99M INJECTION
10.9000 | Freq: Once | INTRAVENOUS | Status: AC | PRN
  Administered 2012-12-22: 10.9 via INTRAVENOUS

## 2012-12-22 MED ORDER — SODIUM IODIDE I 131 CAPSULE
13.5000 | Freq: Once | INTRAVENOUS | Status: AC | PRN
  Administered 2012-12-22: 13.5 via ORAL

## 2012-12-23 ENCOUNTER — Encounter: Payer: Self-pay | Admitting: Internal Medicine

## 2012-12-25 ENCOUNTER — Telehealth: Payer: Self-pay | Admitting: *Deleted

## 2012-12-25 NOTE — Telephone Encounter (Signed)
Called pt and lvm that her thyroid scan showed that she has Graves Disease, which is when the autoantibodies that stimulate the thyroid to produce more thyroid hormones than normal. Dr Elvera Lennox said that you two discussed about this at your last appt. She should like for you to continue the methimazole for now and I will see her in 1 month at your follow up appt. If you have any further questions please call us back at the office.

## 2013-01-18 ENCOUNTER — Ambulatory Visit: Payer: Self-pay | Admitting: Internal Medicine

## 2013-01-18 DIAGNOSIS — Z0289 Encounter for other administrative examinations: Secondary | ICD-10-CM

## 2014-03-22 ENCOUNTER — Ambulatory Visit (INDEPENDENT_AMBULATORY_CARE_PROVIDER_SITE_OTHER): Payer: Self-pay | Admitting: Internal Medicine

## 2014-03-22 ENCOUNTER — Encounter: Payer: Self-pay | Admitting: Internal Medicine

## 2014-03-22 VITALS — BP 128/64 | HR 100 | Temp 98.2°F | Resp 12 | Wt 137.0 lb

## 2014-03-22 DIAGNOSIS — E0590 Thyrotoxicosis, unspecified without thyrotoxic crisis or storm: Secondary | ICD-10-CM

## 2014-03-22 MED ORDER — METHIMAZOLE 10 MG PO TABS: 20.0000 mg | ORAL_TABLET | Freq: Two times a day (BID) | ORAL | Status: DC

## 2014-03-22 MED ORDER — ATENOLOL 50 MG PO TABS: 50.0000 mg | ORAL_TABLET | Freq: Two times a day (BID) | ORAL | Status: DC

## 2014-03-22 NOTE — Patient Instructions (Signed)
Please come back for repeat thyroid labs in 1 month. Please come back for a follow-up appointment in 2 months.  Please increase the Atenolol to 50 mg 2x a day. Restart Methimazole and increase to 20 mg 2x a day.

## 2014-03-22 NOTE — Progress Notes (Signed)
Subjective:     Patient ID: Tracy Carson, female   DOB: May 27, 1981, 33 y.o.   MRN: [id number]  HPI Tracy Carson is a 33 y.o. Spanish woman, returning for f/u for Graves ds. She returns after an absence of 15 months as she did not have insurance. PCP: Dr Lerry Liner. She is here without an interpreter today, but she does understand [place].   Reviewed hx: She started to have thyrotoxic symptoms in 2012 before her pregnancy: tremors, sweating, palpitations, insomnia. She now has a 33 y/o girl (had C-section), and her symptoms persist. She also has an increased appetite, and eats "all the time", even at night. She has hair falling. She has heat intolerance and is sweating most of the time. She initially gained weight during her pregnancy: 170 lbs pre-pregnancy weight >> 180 lbs with her pregnancy >> post-pregnancy lost to 136 lbs.  She was admitted 11/02/2012 for abdominal pain, rectal bleeding (hemorrhoids) but was found to have tachycardia, tremors, nervousness, wt loss of 50 lbs in previous 7 months and a TSH was checked >> returned undetectable (<0.008). She was then started on MMI 10 mg bid and Atenolol 50 mg daily and was discharged with plan for an uptake and scan and a follow-up appt with me.   12/22/2012: Thyroid Uptake and Scan: Markedly elevated 24 thyroid uptake of 77.8%. Imaging findings consistent with Graves' disease.  At last visit, we restarted Atenolol to 50 mg twice a day. We also restarted the methimazole 10 mg twice a day. She apparently stopped both Atenolol and MMI (ran out) ~1.5 mo ago. Dr Mayford Knife restarted Atenolol at 50 mg once a day. She feels this dose is not working for her.   I received recent labs from PCP: 03/08/2014: TSH <0.008, TT4 26.9 (5-12.5)  She described: - + hot flushes - + weight loss (thinks ~8 lbs but not per our scale) - + tremors - + palpitations - + eye pain R>L - sometimes dysphagia/choking  Review of Systems  Constitutional: See above, + excessive  urination Eyes: + blurry vision, + xerophthalmia ENT: no sore throat, no nodules palpated in throat, + dysphagia/no odynophagia, no hoarseness Cardiovascular: no CP/SOB/+ palpitations/no leg swelling Respiratory: no cough/SOB Gastrointestinal: no N/V/+ D/no C/GERD Musculoskeletal: no muscle/joint aches Skin: no rashes Neurological: no tremors/numbness/tingling/dizziness, + HA + low libido  Objective:   Physical Exam BP 128/64  Pulse 100  Temp(Src) 98.2 F (36.8 C) (Oral)  Resp 12  Wt 137 lb (62.143 kg)  SpO2 97% Wt Readings from Last 3 Encounters:  03/22/14 137 lb (62.143 kg)  12/12/12 140 lb (63.504 kg)  11/04/12 136 lb 1.6 oz (61.735 kg)   Constitutional: normal weight, tremulous Eyes: PERRLA, EOMI, mild exophthalmos, no lid lag, no stare; mild bilateral chemosis ENT: moist mucous membranes, + thyromegaly R>L, no cervical lymphadenopathy Cardiovascular: tachycardia, regular rhythm, No MRG Respiratory: CTA B Gastrointestinal: abdomen soft, NT, ND, BS+ Musculoskeletal: no deformities, strength intact in all 4 Skin: moist, warm, no rashes, multiple tattoos Neurological: + tremor with outstretched hands, DTR +3/4 in all  4  Assessment:     1. Graves ds. - could not f/u with me in >1 year 2/2 lack of insurance    Plan:     Pt with signs of thyrotoxicosis, off methimazole and atenolol for 1.5 mo  - I explained that it is dangerous not to treat her disease and the fact that she can develop heart failure and even death from severe Graves ds.  - we  are limited by the fact that she has no insurance >> cannot do RAI tx or refer her to surgery 2/2 cost - I advised her to apply for M'aid ASAP  - until then, I advised her to increase the beta blocker dose to bid (she did feel better on this dose until she ran out and the once a day dosing is not helping her too much, she mentions); we will restart MMI and increase the dose to 20 mg 2x a day - I sent refills for methimazole and  atenolol - I will check a TSH, free T4, free T3, and TSI in 1 mo - I will see her back in 2 months - If the eye sxs are not improving until next visit (she c/o dry, itchy eyes and she has mild chemosis on exam) >> will need a referral to ophthalmology

## 2014-04-02 ENCOUNTER — Encounter: Payer: Self-pay | Admitting: Internal Medicine

## 2014-04-22 ENCOUNTER — Other Ambulatory Visit: Payer: Self-pay

## 2014-05-16 ENCOUNTER — Other Ambulatory Visit: Payer: Self-pay | Admitting: Family Medicine

## 2014-05-16 ENCOUNTER — Ambulatory Visit (INDEPENDENT_AMBULATORY_CARE_PROVIDER_SITE_OTHER): Payer: No Typology Code available for payment source | Admitting: Family Medicine

## 2014-05-16 VITALS — BP 131/69 | HR 90 | Temp 98.2°F | Resp 16 | Ht 62.0 in | Wt 137.0 lb

## 2014-05-16 DIAGNOSIS — R5383 Other fatigue: Secondary | ICD-10-CM

## 2014-05-16 DIAGNOSIS — Z8669 Personal history of other diseases of the nervous system and sense organs: Secondary | ICD-10-CM

## 2014-05-16 DIAGNOSIS — F17200 Nicotine dependence, unspecified, uncomplicated: Secondary | ICD-10-CM

## 2014-05-16 DIAGNOSIS — R000 Tachycardia, unspecified: Secondary | ICD-10-CM

## 2014-05-16 DIAGNOSIS — R002 Palpitations: Secondary | ICD-10-CM

## 2014-05-16 DIAGNOSIS — E0500 Thyrotoxicosis with diffuse goiter without thyrotoxic crisis or storm: Secondary | ICD-10-CM

## 2014-05-16 DIAGNOSIS — Z23 Encounter for immunization: Secondary | ICD-10-CM

## 2014-05-16 NOTE — Progress Notes (Signed)
Subjective:    Patient ID: Tracy Carson, female    DOB: 1981-07-01, 33 y.o.   MRN: [id number]  HPI Ms. Verline Lema presents accompanied by an interpreter to establish care. She states that she has been using urgent care and the emergency department for her primary healthcare needs.   Patient presents with a history of Grave's disease. She states that symptoms of Grave's disease started during pregnancy. She was last followed by Dr. Ernest Haber in August for Grave's disease and started on medication. She reports that she has been taking medications consistently.  Symptoms include occasional palpitations, increased sweating.  Symptoms have been present for several years. The patient denies tender neck / sore throat. The patient reports use of thyroid medicines. Prior studies include TSH.  Family history includes no thyroid abnormalities.   Patient complains of fatigue. Symptoms began several months ago. . Symptoms of her fatigue have been decreased libido and lack of interest in usual activities. Patient denies symptoms of arthritis, unusual rashes, GI blood loss and witnessed or suspected sleep apnea. Symptoms have been intermittent. Severity has been symptoms bothersome, but easily able to carry out all usual work/school/family activities.   Patient complains of palpitations.  The symptoms are of mild and off and on in  severity, occuring intermittently and lasting a few seconds per episode.  Aggravating factors include thyroid medications. Relieving factors: beta blocker.   Past Medical History  Diagnosis Date  . Tuberculosis     as a child  . Thyroid disease     Review of Systems  Constitutional: Positive for diaphoresis and fatigue.  Eyes: Positive for itching and visual disturbance. Negative for photophobia.  Respiratory: Positive for shortness of breath.   Cardiovascular: Positive for chest pain.  Gastrointestinal: Negative.   Endocrine: Positive for heat intolerance, polyphagia and  polyuria.  Musculoskeletal: Negative.   Skin: Negative.   Allergic/Immunologic: Positive for environmental allergies.  Neurological: Positive for weakness. Negative for tremors and headaches.  Hematological: Negative.   Psychiatric/Behavioral: Negative.  Negative for suicidal ideas.       Objective:   Physical Exam  Constitutional: She is oriented to person, place, and time. She appears well-developed and well-nourished. No distress.  HENT:  Head: Normocephalic and atraumatic.  Right Ear: External ear normal.  Left Ear: External ear normal.  Eyes: Conjunctivae and EOM are normal. Pupils are equal, round, and reactive to light.  Neck: Normal range of motion. Neck supple.  Cardiovascular: Normal rate, normal heart sounds, intact distal pulses and normal pulses.  An irregular rhythm present. Exam reveals no decreased pulses.   Pulmonary/Chest: Effort normal and breath sounds normal.  Abdominal: Soft. Bowel sounds are normal. She exhibits no distension. There is no tenderness.  Musculoskeletal: Normal range of motion.  Neurological: She is alert and oriented to person, place, and time. No cranial nerve deficit or sensory deficit. She displays a negative Romberg sign.  Skin: Skin is warm. She is diaphoretic. No cyanosis. Nails show no clubbing.  Psychiatric: She has a normal mood and affect. Her behavior is normal. Judgment and thought content normal.         BP 131/69  Pulse 90  Temp(Src) 98.2 F (36.8 C) (Oral)  Resp 16  Ht 5\' 2"  (1.575 m)  Wt 137 lb (62.143 kg)  BMI 25.05 kg/m2  LMP 04/14/2014  Assessment & Plan:   1. Graves disease Reviewed NM Thyroid Uptake on 12/21/2012. Results were consistent with Grave's disease. Last TSH was <0.008. Patient is currently taking  Methimazole and Atenolol BID consistently. Will check current TSH level. Ms. Verline Lema states that she is followed by Dr. Ernest Haber for Grave's disease. She states that her last appointment was in August and  she does not have a follow up appointment scheduled. I recommend that patient schedules a follow up appointment. I will check TSH today.  - TSH  2. Tachycardia She has a history of tachycardia. Her current pulse is 90 bpm. She states that she has occasional palpations and periodic fatigue. I will check EKG and I reviewed last EKG, which was obtained during pregnancy.  - EKG 12-Lead - COMPLETE METABOLIC PANEL WITH GFR  3. Other fatigue  - TSH - COMPLETE METABOLIC PANEL WITH GFR - CBC with Differential  4. Palpitations Ms. Verline Lema reports occasional palpitations. Will check EKG. She also has a history of uncontrolled Grave's disease. I will check TSH today.    5. Tobacco dependence Ms. Verline Lema smokes a 1/2 pack per day. We discussed the importance of smoking cessation. She states that she is ready to quit, but it has been difficult. I discussed nicotine patches and Chantix at length. She states that she is not ready to start interventions for smoking cessation. She states that she knows that she will not take anything consistently. Smoking cessation instruction/counseling given:  counseled patient on the dangers of tobacco use, advised patient to stop smoking, and reviewed strategies to maximize success  6. History of blurry vision  Referral to opthalmology. She reports that she has never had an eye exam.   7. History of itching of eye Referral to opthalmology   8. Need for immunization against influenza  - Flu Vaccine QUAD 36+ mos IM (Fluarix)  9. Immunization due  - Pneumococcal polysaccharide vaccine 23-valent greater than or equal to 2yo subcutaneous/IM   Preventative health:  Last menstrual period: 04/14/2014, normal. She states that she has previously had heavy bleeding. She states that she had an IUD placed at the local heath department 1 year ago    RTC: 1 month with Dr. Ashley Royalty for Palpitations, Fatigue Patient to follow up with Dr. Elvera Lennox for Grave's disease    Massie Maroon, FNP

## 2014-05-17 LAB — CBC WITH DIFFERENTIAL/PLATELET
Basophils Absolute: 0 10*3/uL (ref 0.0–0.1)
Basophils Relative: 0 % (ref 0–1)
EOS ABS: 0.1 10*3/uL (ref 0.0–0.7)
Eosinophils Relative: 2 % (ref 0–5)
HCT: 41.5 % (ref 36.0–46.0)
HEMOGLOBIN: 15.1 g/dL — AB (ref 12.0–15.0)
LYMPHS ABS: 2.9 10*3/uL (ref 0.7–4.0)
LYMPHS PCT: 41 % (ref 12–46)
MCH: 29.2 pg (ref 26.0–34.0)
MCHC: 36.4 g/dL — ABNORMAL HIGH (ref 30.0–36.0)
MCV: 80.1 fL (ref 78.0–100.0)
MONOS PCT: 9 % (ref 3–12)
Monocytes Absolute: 0.6 10*3/uL (ref 0.1–1.0)
NEUTROS ABS: 3.4 10*3/uL (ref 1.7–7.7)
NEUTROS PCT: 48 % (ref 43–77)
Platelets: 221 10*3/uL (ref 150–400)
RBC: 5.18 MIL/uL — AB (ref 3.87–5.11)
RDW: 13.7 % (ref 11.5–15.5)
WBC: 7 10*3/uL (ref 4.0–10.5)

## 2014-05-17 LAB — COMPLETE METABOLIC PANEL WITH GFR
ALT: 21 U/L (ref 0–35)
AST: 22 U/L (ref 0–37)
Albumin: 4.3 g/dL (ref 3.5–5.2)
Alkaline Phosphatase: 311 U/L — ABNORMAL HIGH (ref 39–117)
BILIRUBIN TOTAL: 0.4 mg/dL (ref 0.2–1.2)
BUN: 8 mg/dL (ref 6–23)
CO2: 20 meq/L (ref 19–32)
CREATININE: 0.41 mg/dL — AB (ref 0.50–1.10)
Calcium: 9.1 mg/dL (ref 8.4–10.5)
Chloride: 109 mEq/L (ref 96–112)
GFR, Est Non African American: >89 mL/min
Glucose, Bld: 130 mg/dL — ABNORMAL HIGH (ref 70–99)
Potassium: 4.4 mEq/L (ref 3.5–5.3)
Sodium: 138 mEq/L (ref 135–145)
Total Protein: 6.7 g/dL (ref 6.0–8.3)

## 2014-05-17 LAB — TSH: TSH: 0.011 u[IU]/mL — AB (ref 0.350–4.500)

## 2014-05-17 LAB — T4, FREE: Free T4: 6.2 ng/dL — ABNORMAL HIGH (ref 0.80–1.80)

## 2014-05-17 LAB — T3, FREE: T3, Free: >20 pg/mL — ABNORMAL HIGH (ref 2.3–4.2)

## 2014-05-20 ENCOUNTER — Encounter: Payer: Self-pay | Admitting: Family Medicine

## 2014-05-20 ENCOUNTER — Telehealth: Payer: Self-pay | Admitting: Family Medicine

## 2014-05-20 DIAGNOSIS — Z8669 Personal history of other diseases of the nervous system and sense organs: Secondary | ICD-10-CM | POA: Insufficient documentation

## 2014-05-20 DIAGNOSIS — R002 Palpitations: Secondary | ICD-10-CM | POA: Insufficient documentation

## 2014-05-20 DIAGNOSIS — R5383 Other fatigue: Secondary | ICD-10-CM | POA: Insufficient documentation

## 2014-05-20 DIAGNOSIS — E0500 Thyrotoxicosis with diffuse goiter without thyrotoxic crisis or storm: Secondary | ICD-10-CM

## 2014-05-20 DIAGNOSIS — F17200 Nicotine dependence, unspecified, uncomplicated: Secondary | ICD-10-CM | POA: Insufficient documentation

## 2014-05-20 NOTE — Telephone Encounter (Signed)
Called patient to advised of lab results, number is not a working number, called and spoke with Dawn at [facility], Patient has an appointment with their office on Wednesday 05/22/2014. Dawn will notify provider to follow up on lab work at Wednesday appointment. Thanks!

## 2014-05-20 NOTE — Telephone Encounter (Signed)
Reviewed labs. Patient will need to follow up with Dr. Elvera LennoxGherghe, endocrinology. Will send labs.    Massie MaroonHollis,Jocelin Schuelke M, FNP

## 2014-05-22 ENCOUNTER — Ambulatory Visit: Payer: Self-pay | Admitting: Internal Medicine

## 2014-05-22 DIAGNOSIS — Z0289 Encounter for other administrative examinations: Secondary | ICD-10-CM

## 2014-05-27 ENCOUNTER — Encounter: Payer: Self-pay | Admitting: Family Medicine

## 2014-06-27 ENCOUNTER — Ambulatory Visit (INDEPENDENT_AMBULATORY_CARE_PROVIDER_SITE_OTHER): Payer: No Typology Code available for payment source | Admitting: Internal Medicine

## 2014-06-27 ENCOUNTER — Encounter (INDEPENDENT_AMBULATORY_CARE_PROVIDER_SITE_OTHER): Payer: Self-pay

## 2014-06-27 VITALS — BP 130/62 | HR 96 | Temp 98.1°F | Resp 16 | Ht 62.0 in | Wt 137.0 lb

## 2014-06-27 DIAGNOSIS — R251 Tremor, unspecified: Secondary | ICD-10-CM

## 2014-06-27 DIAGNOSIS — E0500 Thyrotoxicosis with diffuse goiter without thyrotoxic crisis or storm: Secondary | ICD-10-CM

## 2014-06-27 DIAGNOSIS — R000 Tachycardia, unspecified: Secondary | ICD-10-CM

## 2014-06-27 NOTE — Progress Notes (Signed)
Patient ID: Tracy Carson, female   DOB: 02/26/1981, 33 y.o.   MRN: [id number]   Tracy Carson, is a 33 y.o. female  YQM:[id number]  BMW:[id number]  DOB - 1981-04-19  CC:  Chief Complaint  Patient presents with  . Follow-up    interperater- Lennox Grumbles cordon.        HPI: Tracy Carson is a 33 y.o. female here today to follow upon graves disease. She speaks spanish and understands some English but the interpreter is present. Pt was diagnosed with Graves Dx and started on Methimazole and Atenolol. Radio-Iodine ablation was discussed with Dr. Elvera Lennox (Endocrinologist) but felt no feasible due to small children and lack of payer source. Pt had stopped her Methimazole and was subsequently restarted at a higher dose. She has since been compliant with medications. Today she reports symptoms of occasional tremors and fast heart rate but states that these are all improved since she started back on her Tapazole. She reports that he has been compliant with tapazole and Atenolol. She denies weakness.  Patient has No headache, No chest pain, No abdominal pain - No Nausea, No new weakness tingling or numbness, No Cough - SOB.  No Known Allergies Past Medical History  Diagnosis Date  . Tuberculosis     as a child  . Thyroid disease    Current Outpatient Prescriptions on File Prior to Visit  Medication Sig Dispense Refill  . atenolol (TENORMIN) 50 MG tablet Take 1 tablet (50 mg total) by mouth 2 (two) times daily. 60 tablet 2  . hydrocortisone (ANUSOL-HC) 25 MG suppository Place 1 suppository (25 mg total) rectally 2 (two) times daily as needed for hemorrhoids. 12 suppository 0  . iron polysaccharides (NIFEREX) 150 MG capsule Take 1 capsule (150 mg total) by mouth 2 (two) times daily. 60 capsule 0  . methimazole (TAPAZOLE) 10 MG tablet Take 2 tablets (20 mg total) by mouth 2 (two) times daily. 120 tablet 2  . polyethylene glycol (MIRALAX / GLYCOLAX) packet Take 17 g by mouth daily. 14 each    No current  facility-administered medications on file prior to visit.   Family History  Problem Relation Age of Onset  . Diabetes Mother   . Thyroid disease Paternal Grandmother    History   Social History  . Marital Status: Single    Spouse Name: N/A    Number of Children: N/A  . Years of Education: N/A   Occupational History  . Not on file.   Social History Main Topics  . Smoking status: Current Some Day Smoker -- 0.25 packs/day for 1 years    Types: Cigarettes  . Smokeless tobacco: Never Used  . Alcohol Use: No  . Drug Use: No  . Sexual Activity: Yes    Birth Control/ Protection: Condom   Other Topics Concern  . Not on file   Social History Narrative    Review of Systems: Constitutional: Negative for fever, chills, diaphoresis, activity change, appetite change and fatigue. HENT: Negative for ear pain, nosebleeds, congestion, facial swelling, rhinorrhea, neck pain, neck stiffness and ear discharge.  Eyes: Negative for pain, discharge, redness, itching and visual disturbance. Respiratory: Negative for cough, choking, chest tightness, shortness of breath, wheezing and stridor.  Cardiovascular: Negative for chest pain, palpitations and leg swelling. Gastrointestinal: Negative for abdominal distention. Genitourinary: Negative for dysuria, urgency, frequency, hematuria, flank pain, decreased urine volume, difficulty urinating and dyspareunia.  Musculoskeletal: Negative for back pain, joint swelling, arthralgia and gait problem. Neurological: Negative for dizziness, tremors, seizures, syncope,  facial asymmetry, speech difficulty, weakness, light-headedness, numbness and headaches.  Hematological: Negative for adenopathy. Does not bruise/bleed easily. Psychiatric/Behavioral: Negative for hallucinations, behavioral problems, confusion, dysphoric mood, decreased concentration and agitation.    Objective:    Filed Vitals:   06/27/14 1046  BP: 130/62  Pulse: 96  Temp: 98.1 F (36.7  C)  Resp: 16    Physical Exam: Constitutional: Patient appears well-developed and well-nourished. No distress. HENT: Normocephalic, atraumatic, External right and left ear normal. Oropharynx is clear and moist.  Eyes: Conjunctivae and EOM are normal. PERRLA, no scleral icterus. Neck: Normal ROM. Neck supple. No JVD. No tracheal deviation. No thyromegaly. CVS: RRR, S1/S2 +, no murmurs, no gallops, no carotid bruit.  Pulmonary: Effort and breath sounds normal, no stridor, rhonchi, wheezes, rales.  Abdominal: Soft. BS +, no distension, tenderness, rebound or guarding.  Musculoskeletal: Normal range of motion. No edema and no tenderness.  Lymphadenopathy: No lymphadenopathy noted, cervical, inguinal or axillary Neuro: Alert. Normal reflexes, muscle tone coordination. No cranial nerve deficit. Skin: Skin is warm and dry. No rash noted. Not diaphoretic. No erythema. No pallor. However she does have a tanned appearance in the winter which she reports apeared just before the diagnosis of graves was made. Psychiatric: Normal mood and affect. Behavior, judgment, thought content normal.  Lab Results  Component Value Date   WBC 7.0 05/16/2014   HGB 15.1* 05/16/2014   HCT 41.5 05/16/2014   MCV 80.1 05/16/2014   PLT 221 05/16/2014   Lab Results  Component Value Date   CREATININE 0.41* 05/16/2014   BUN 8 05/16/2014   NA 138 05/16/2014   K 4.4 05/16/2014   CL 109 05/16/2014   CO2 20 05/16/2014    No results found for: HGBA1C Lipid Panel  No results found for: CHOL, TRIG, HDL, CHOLHDL, VLDL, LDLCALC     Assessment and plan:   1. Tachycardia - Likely a complication of incomplete suppression of Thyroxine production by Tapazole. Will check  Electrolytes, TSH, T4 and T3 levels and make a decision about increasing Tapazole dose. - Basic Metabolic Panel - Magnesium  2. Graves disease - Pt has symptoms of uncontrolled Graves disease. Will consider increasing Tapazole after labs reviewed. -  Thyroid Panel With TSH  3. Tremors of nervous system - Likely related to Grave's disease - Basic Metabolic Panel - Magnesium  4. Hyperpigmentation - I am concerned about adrenal insufficiency.; However pt not showing signs of adrenal crisis so will defer assessment to after Thyroid function regulated as this could have an effect on adrenal function and studies.   Return in about 2 months (around 08/28/2014) for Annual Physical.  The patient was given clear instructions to go to ER or return to medical center if symptoms don't improve, worsen or new problems develop. The patient verbalized understanding. The patient was told to call to get lab results if they haven't heard anything in the next week.     This note has been created with Education officer, environmental. Any transcriptional errors are unintentional.    MATTHEWS,MICHELLE A., MD [facility] [place], [place] [phone]   06/27/2014, 11:49 AM

## 2014-06-28 ENCOUNTER — Telehealth: Payer: Self-pay | Admitting: Internal Medicine

## 2014-06-28 ENCOUNTER — Other Ambulatory Visit: Payer: Self-pay | Admitting: Internal Medicine

## 2014-06-28 DIAGNOSIS — E0500 Thyrotoxicosis with diffuse goiter without thyrotoxic crisis or storm: Secondary | ICD-10-CM

## 2014-06-28 DIAGNOSIS — T50905S Adverse effect of unspecified drugs, medicaments and biological substances, sequela: Secondary | ICD-10-CM

## 2014-06-28 LAB — BASIC METABOLIC PANEL
BUN: 9 mg/dL (ref 6–23)
CALCIUM: 9.7 mg/dL (ref 8.4–10.5)
CO2: 21 mEq/L (ref 19–32)
Chloride: 108 mEq/L (ref 96–112)
Creat: 0.41 mg/dL — ABNORMAL LOW (ref 0.50–1.10)
GLUCOSE: 99 mg/dL (ref 70–99)
Potassium: 4.2 mEq/L (ref 3.5–5.3)
SODIUM: 137 meq/L (ref 135–145)

## 2014-06-28 LAB — THYROID PANEL WITH TSH
T3 Uptake: 48 % — ABNORMAL HIGH (ref 22–35)
TSH: 0.011 u[IU]/mL — AB (ref 0.350–4.500)

## 2014-06-28 LAB — MAGNESIUM: Magnesium: 1.7 mg/dL (ref 1.5–2.5)

## 2014-06-28 MED ORDER — METHIMAZOLE 10 MG PO TABS: 20.0000 mg | ORAL_TABLET | Freq: Three times a day (TID) | ORAL | Status: DC

## 2014-06-28 NOTE — Telephone Encounter (Signed)
Spoke with Dr. Elvera LennoxGherghe about Ms. Tracy Carson. [name] is unable to afford going to the Endocrinologist. Dr. Lafe GarinGherge has recommended increasing her dose to 20 mg TID and rechecking labs in 6 weeks. Dr. Lafe GarinGherge will review labs along with me to attempt the most accessible care in a patient with limited resources.

## 2014-07-01 ENCOUNTER — Telehealth: Payer: Self-pay

## 2014-07-01 NOTE — Telephone Encounter (Signed)
Left message regarding lab work

## 2014-07-03 NOTE — Telephone Encounter (Signed)
Called and advised patient of Lab work and that medication was increased to stop taking old dose and pick up new rx at pharmacy and take as directed. Patient verbalized understanding. Thanks!

## 2014-08-28 ENCOUNTER — Other Ambulatory Visit (INDEPENDENT_AMBULATORY_CARE_PROVIDER_SITE_OTHER): Payer: No Typology Code available for payment source

## 2014-08-28 DIAGNOSIS — T50905S Adverse effect of unspecified drugs, medicaments and biological substances, sequela: Secondary | ICD-10-CM

## 2014-08-28 DIAGNOSIS — T887XXS Unspecified adverse effect of drug or medicament, sequela: Secondary | ICD-10-CM

## 2014-08-28 LAB — CBC WITH DIFFERENTIAL/PLATELET
Basophils Absolute: 0 10*3/uL (ref 0.0–0.1)
Basophils Relative: 0 % (ref 0–1)
Eosinophils Absolute: 0.2 10*3/uL (ref 0.0–0.7)
Eosinophils Relative: 2 % (ref 0–5)
HEMATOCRIT: 42.5 % (ref 36.0–46.0)
Hemoglobin: 14.8 g/dL (ref 12.0–15.0)
Lymphocytes Relative: 36 % (ref 12–46)
Lymphs Abs: 2.8 10*3/uL (ref 0.7–4.0)
MCH: 28.8 pg (ref 26.0–34.0)
MCHC: 34.8 g/dL (ref 30.0–36.0)
MCV: 82.8 fL (ref 78.0–100.0)
MONO ABS: 0.6 10*3/uL (ref 0.1–1.0)
MPV: 11.3 fL (ref 8.6–12.4)
Monocytes Relative: 7 % (ref 3–12)
NEUTROS ABS: 4.3 10*3/uL (ref 1.7–7.7)
Neutrophils Relative %: 55 % (ref 43–77)
PLATELETS: 251 10*3/uL (ref 150–400)
RBC: 5.13 MIL/uL — ABNORMAL HIGH (ref 3.87–5.11)
RDW: 13.4 % (ref 11.5–15.5)
WBC: 7.9 10*3/uL (ref 4.0–10.5)

## 2014-08-29 ENCOUNTER — Emergency Department (HOSPITAL_COMMUNITY): Payer: No Typology Code available for payment source

## 2014-08-29 ENCOUNTER — Ambulatory Visit: Payer: No Typology Code available for payment source | Admitting: Family Medicine

## 2014-08-29 ENCOUNTER — Encounter (HOSPITAL_COMMUNITY): Payer: Self-pay | Admitting: *Deleted

## 2014-08-29 ENCOUNTER — Emergency Department (HOSPITAL_COMMUNITY)
Admission: EM | Admit: 2014-08-29 | Discharge: 2014-08-29 | Disposition: A | Discharge status: Home / Self Care | Payer: No Typology Code available for payment source | Attending: Emergency Medicine | Admitting: Emergency Medicine

## 2014-08-29 DIAGNOSIS — Z8611 Personal history of tuberculosis: Secondary | ICD-10-CM | POA: Insufficient documentation

## 2014-08-29 DIAGNOSIS — J40 Bronchitis, not specified as acute or chronic: Secondary | ICD-10-CM | POA: Insufficient documentation

## 2014-08-29 DIAGNOSIS — Z720 Tobacco use: Secondary | ICD-10-CM | POA: Insufficient documentation

## 2014-08-29 DIAGNOSIS — E079 Disorder of thyroid, unspecified: Secondary | ICD-10-CM | POA: Insufficient documentation

## 2014-08-29 DIAGNOSIS — F17200 Nicotine dependence, unspecified, uncomplicated: Secondary | ICD-10-CM

## 2014-08-29 DIAGNOSIS — R05 Cough: Secondary | ICD-10-CM

## 2014-08-29 DIAGNOSIS — R059 Cough, unspecified: Secondary | ICD-10-CM

## 2014-08-29 DIAGNOSIS — M549 Dorsalgia, unspecified: Secondary | ICD-10-CM | POA: Insufficient documentation

## 2014-08-29 DIAGNOSIS — E0500 Thyrotoxicosis with diffuse goiter without thyrotoxic crisis or storm: Secondary | ICD-10-CM | POA: Insufficient documentation

## 2014-08-29 DIAGNOSIS — Z79899 Other long term (current) drug therapy: Secondary | ICD-10-CM | POA: Insufficient documentation

## 2014-08-29 MED ORDER — DM-GUAIFENESIN ER 30-600 MG PO TB12
1.0000 | ORAL_TABLET | Freq: Once | ORAL | Status: AC
  Administered 2014-08-29: 1 via ORAL
  Filled 2014-08-29: qty 1

## 2014-08-29 MED ORDER — BENZONATATE 200 MG PO CAPS: 200.0000 mg | ORAL_CAPSULE | Freq: Three times a day (TID) | ORAL | Status: DC | PRN

## 2014-08-29 MED ORDER — ALBUTEROL SULFATE HFA 108 (90 BASE) MCG/ACT IN AERS
1.0000 | INHALATION_SPRAY | RESPIRATORY_TRACT | Status: DC | PRN
  Administered 2014-08-29: 2 via RESPIRATORY_TRACT
  Filled 2014-08-29: qty 6.7

## 2014-08-29 MED ORDER — DM-GUAIFENESIN ER 30-600 MG PO TB12: 1.0000 | ORAL_TABLET | Freq: Two times a day (BID) | ORAL | Status: DC | PRN

## 2014-08-29 MED ORDER — ALBUTEROL SULFATE HFA 108 (90 BASE) MCG/ACT IN AERS: 1.0000 | INHALATION_SPRAY | RESPIRATORY_TRACT | Status: DC | PRN

## 2014-08-29 MED ORDER — BENZONATATE 100 MG PO CAPS
200.0000 mg | ORAL_CAPSULE | Freq: Three times a day (TID) | ORAL | Status: DC | PRN
  Administered 2014-08-29: 200 mg via ORAL
  Filled 2014-08-29: qty 2

## 2014-08-29 NOTE — ED Provider Notes (Signed)
CSN: [id number]     Arrival date & time 08/29/14  0215 History  This chart was scribed for Olivia Mackie, MD by Annye Asa, ED Scribe. This patient was seen in room A02C/A02C and the patient's care was started at 2:57 AM.    Chief Complaint  Patient presents with  . Cough   Patient is a 34 y.o. female presenting with cough. The history is provided by the patient. No language interpreter was used.  Cough Associated symptoms: no chest pain and no fever     HPI Comments: Tracy Carson is a 34 y.o. female who presents to the Emergency Department complaining of 3 weeks of productive cough (green sputum) and back pain. She reports post-tussive emesis. She has been taking Nyquil and utilizing warm teas with honey; neither treatment has provided relief. She denies fevers, abdominal pain, history of asthma or bronchitis, chest pain.   Patient is a .25ppd smoker. LNMP 08/24/13.   Past Medical History  Diagnosis Date  . Tuberculosis     as a child  . Thyroid disease    Past Surgical History  Procedure Laterality Date  . Cholecystectomy    . Cesarean section  07/02/2011    Procedure: CESAREAN SECTION;  Surgeon: Roseanna Rainbow, MD;  Location: [facility];  Service: Gynecology;  Laterality: N/A;   Family History  Problem Relation Age of Onset  . Diabetes Mother   . Thyroid disease Paternal Grandmother    History  Substance Use Topics  . Smoking status: Current Some Day Smoker -- 0.25 packs/day for 1 years    Types: Cigarettes  . Smokeless tobacco: Never Used  . Alcohol Use: No   OB History    Gravida Para Term Preterm AB TAB SAB Ectopic Multiple Living   Review of Systems  Constitutional: Negative for fever.  Respiratory: Positive for cough.   Cardiovascular: Negative for chest pain.  Gastrointestinal: Positive for vomiting. Negative for abdominal pain.  Musculoskeletal: Positive for back pain.   Allergies  Review of patient's allergies indicates no known  allergies.  Home Medications   Prior to Admission medications   Medication Sig Start Date End Date Taking? Authorizing Provider  atenolol (TENORMIN) 50 MG tablet Take 1 tablet (50 mg total) by mouth 2 (two) times daily. 03/22/14  Yes Carlus Pavlov, MD  methimazole (TAPAZOLE) 10 MG tablet Take 2 tablets (20 mg total) by mouth 3 (three) times daily. 06/28/14  Yes Altha Harm, MD  hydrocortisone (ANUSOL-HC) 25 MG suppository Place 1 suppository (25 mg total) rectally 2 (two) times daily as needed for hemorrhoids. Patient not taking: Reported on 08/29/2014 11/04/12   Sorin Luanne Bras, MD  iron polysaccharides (NIFEREX) 150 MG capsule Take 1 capsule (150 mg total) by mouth 2 (two) times daily. Patient not taking: Reported on 08/29/2014 11/04/12   Sorin Luanne Bras, MD  polyethylene glycol (MIRALAX / GLYCOLAX) packet Take 17 g by mouth daily. Patient not taking: Reported on 08/29/2014 11/04/12   Sorin Luanne Bras, MD   LMP 08/24/2014 Physical Exam  Constitutional: She is oriented to person, place, and time. She appears well-developed and well-nourished. No distress.  HENT:  Head: Normocephalic and atraumatic.  Mouth/Throat: Oropharynx is clear and moist. No oropharyngeal exudate.  Eyes: EOM are normal. Pupils are equal, round, and reactive to light.  Neck: Normal range of motion. Neck supple.  Cardiovascular: Normal rate, regular rhythm and normal heart sounds.  Exam reveals no gallop and no friction rub.   No murmur heard. Pulmonary/Chest: Effort normal. No respiratory distress.  Crackles in left lower lobe  Abdominal: Soft. There is no tenderness. There is no rebound and no guarding.  Musculoskeletal: Normal range of motion. She exhibits no edema.  Neurological: She is alert and oriented to person, place, and time.  Skin: Skin is warm and dry. No rash noted.  Psychiatric: She has a normal mood and affect. Her behavior is normal.  Nursing note and vitals reviewed.   ED Course  Procedures    DIAGNOSTIC STUDIES: Oxygen Saturation is 96% on RA, adequate by my interpretation.    COORDINATION OF CARE: 3:02 AM Discussed treatment plan with pt at bedside and pt agreed to plan.   Labs Review Labs Reviewed - No data to display  Imaging Review Dg Chest 2 View  08/29/2014   CLINICAL DATA:  Acute onset of cough. Upper back pain. Initial encounter.  EXAM: CHEST  2 VIEW  COMPARISON:  Chest radiograph performed 02/07/2006  FINDINGS: The lungs are well-aerated. Peribronchial thickening is noted. There is no evidence of focal opacification, pleural effusion or pneumothorax. Mildly increased density at the lung bases is thought to reflect overlying soft tissues, as no focal opacity is seen on the lateral view.  The heart is normal in size; the mediastinal contour is within normal limits. No acute osseous abnormalities are seen.  IMPRESSION: Peribronchial thickening noted; lungs otherwise grossly clear.   Electronically Signed   By: Roanna RaiderJeffery  Chang M.D.   On: 08/29/2014 03:19     EKG Interpretation   Date/Time:  Thursday August 29 2014 03:25:00 EST Ventricular Rate:  111 PR Interval:  118 QRS Duration: 85 QT Interval:  347 QTC Calculation: 471 R Axis:   85 Text Interpretation:  Sinus tachycardia No significant change since last  tracing Confirmed by Monice Lundy  MD, Raysha Tilmon ([id number]) on 08/29/2014 3:47:19 AM      MDM   Final diagnoses:  Cough    I personally performed the services described in this documentation, which was scribed in my presence. The recorded information has been reviewed and is accurate.  34 year old female with 3 weeks of cough.  She has history of Graves' disease.  She reports that she is compliant with her medications.  She is due to follow-up with Dr. Ashley RoyaltyMatthews.  She is unsure fevers.  Cough is dry, hacking in nature.  She is a smoker.  She has posttussive emesis.  Chest x-ray with bronchial thickening.  Will start on albuterol inhaler, recommend that she stop smoking,  give cough suppressants.     Olivia Mackielga M Airi Copado, MD 08/29/14 [phone]

## 2014-08-29 NOTE — ED Notes (Signed)
C/oa headache also 

## 2014-08-29 NOTE — ED Notes (Signed)
Patient transported to X-ray 

## 2014-08-29 NOTE — Discharge Instructions (Signed)
Please take medications as per prescribed.  It is very important for you to stop smoking to help heal your lungs.  Follow-up with Dr. Ashley Royalty for recheck of your thyroid disease.  Return to the emergency department for worsening condition or new concerning symptoms.   Tos - Adultos  (Cough, Adult)  La tos es un reflejo que ayuda a limpiar las vas areas y Administrator. Puede ayudar a curar el organismo o ser Neomia Dear reaccin a un irritante. La tos puede durar [employer] 2  3 semanas (aguda) o puede durar ms de 8 semanas (crnica)  CAUSAS  Tos aguda:   Infecciones virales o bacterianas. Tos crnica.   Infecciones.  Alergias.  Asma.  Goteo post nasal.  El hbito de fumar.  Acidez o reflujo gstrico.  Algunos medicamentos.  Problemas pulmonares crnicos  Cncer. SNTOMAS   Tos.  Grant Ruts.  Dolor en el pecho.  Aumento en el ritmo respiratorio.  Ruidos agudos al respirar (sibilancias).  Moco coloreado al toser (esputo). TRATAMIENTO   Un tos de causa bacteriana puede tratarse con antibiticos.  La tos de origen viral debe seguir su curso y no responde a los antibiticos.  El mdico podr recomendar otros tratamientos si tiene tos crnica. INSTRUCCIONES PARA EL CUIDADO EN EL HOGAR   Solo tome medicamentos que se pueden comprar sin receta o recetados para Chief Technology Officer, Dentist o fiebre, como le indica el mdico. Utilice antitusivos slo en la forma indicada por el mdico.  Use un vaporizador o humidificador de niebla fra en la habitacin para ayudar a aflojar las secreciones.  Duerma en posicin semi erguida si la tos empeora por la noche.  Descanse todo lo que pueda.  Si fuma, abandone el hbito. SOLICITE ATENCIN MDICA DE INMEDIATO SI:   Observa pus en el esputo.  La tos empeora.  No puede controlar la tos con antitusivos y no puede dormir debido a Secretary/administrator.  Comienza a escupir sangre al toser.  Tiene dificultad para respirar.  El dolor empeora o no puede  controlarlo con los medicamentos.  Tiene fiebre. ASEGRESE DE QUE:   Comprende estas instrucciones.  Controlar su enfermedad.  Solicitar ayuda de inmediato si no mejora o si empeora. Document Released: 02/17/2011 Document Revised: 10/04/2011 [facility] Patient Information 2015 [place], [place]. This information is not intended to replace advice given to you by your health care provider. Make sure you discuss any questions you have with your health care provider.  Vaporizadores de Soil scientist fro Clinical research associate) Los vaporizadores ayudan a Paramedic los sntomas de la tos y Metallurgist. Agregan humedad al aire, lo que fluidifica el moco y lo hace menos espeso. Facilitan la respiracin y favorecen la eliminacin de secreciones. Los vaporizadores de aire fro no provocan quemaduras serias [employer] de aire caliente, que tambin se llaman humidificadores. No se ha probado que los vaporizadores mejoren el resfro. No debe usar un vaporizador si es Pharmacologist. INSTRUCCIONES PARA EL CUIDADO EN EL HOGAR  Siga las instrucciones para el uso del vaporizador que se encuentran en la caja.  Use solamente agua destilada en el vaporizador.  No use el vaporizador en forma continua. Esto puede formar moho o hacer que se desarrollen bacterias en el vaporizador.  Limpie el vaporizador cada vez que se use.  Lmpielo y squelo bien antes de guardarlo.  Deje de usarlo si los sntomas respiratorios empeoran. Document Released: 03/14/2013 Document Revised: 07/17/2013 [facility] Patient Information 2015 [place], [place]. This information is not intended to replace advice given to  you by your health care provider. Make sure you discuss any questions you have with your health care provider.  Cmo usar Advertising account executive (How to Use an Inhaler) Es muy importante usar una tcnica adecuada para Therapist, nutritional. Una buena tcnica garantiza que el medicamento llegue a los pulmones. Una tcnica inadecuada da como resultado que  el medicamento se deposite en la lengua y en la parte posterior de la garganta en lugar de llegar a las vas respiratorias. Si no aplica una buena tcnica a la hora de [employer], el medicamento no lo ayudar. PASOS QUE DEBE SEGUIR SI [place] UN INHALADOR SIN TUBO DE EXTENSIN  Retire la tapa del inhalador.  Si [place] el inhalador por primera vez, ser necesario que lo prepare. Sacuda el inhalador durante 5 segundos y libere cuatro descargas en el aire, lejos del [place]. Consulte a su mdico o farmacutico si tiene preguntas acerca de la preparacin del inhalador.  Sacuda el inhalador durante 5segundos antes de cada inspiracin (inhalacin).  Coloque el inhalador de modo que la parte superior del envase quede [place].  Coloque el dedo ndice en la parte superior del envase del medicamento. El pulgar sostiene la parte inferior del [place].  Abra la boca.  Coloque el [employer] dientes y apriete los labios fuertemente alrededor de la boquilla, o sostenga el inhalador a una distancia de 1 a 2pulgadas (2,5 a 5cm) de la boca abierta. Si no est seguro de qu tcnica usar, consulte a su mdico.  Espire (exhale) normalmente y lo ms profundamente posible.  Presione el envase hacia abajo con el dedo ndice para Pharmacologist.  Al mismo tiempo que presiona el envase, inhale profunda y lentamente hasta que los pulmones se llenen completamente. Esto debe llevarle de 4 a 6segundos. Mantenga la lengua abajo.  Mantenga el medicamento en los pulmones entre 5 y 10segundos (10segundos es lo ms indicado). Esto ayuda a que el medicamento ingrese en las vas respiratorias pequeas y en los pulmones.  Exhale lentamente a travs de los labios fruncidos. Ponga los labios como cuando silba.  Entre cada descarga, espere de 15 a 30 segundos, como mnimo. Contine con los pasos indicados anteriormente hasta que haya tomado el nmero de descargas que el mdico le indic. No use el  inhalador ms veces de las que su mdico le indique.  Vuelva a colocar la tapa del inhalador.  Siga las indicaciones de su mdico o las instrucciones que vienen con el inhalador para limpiarlo. PASOS QUE DEBE SEGUIR SI [place] UN INHALADOR CON EXTENSIN (ESPACIADOR)  Retire la tapa del inhalador.  Si [place] el inhalador por primera vez, ser necesario que lo prepare. Sacuda el inhalador durante 5 segundos y libere cuatro descargas en el aire, lejos del [place]. Consulte a su mdico o farmacutico si tiene preguntas acerca de la preparacin del inhalador.  Sacuda el inhalador durante 5segundos antes de cada inspiracin (inhalacin).  Coloque el extremo abierto del Armed forces training and education officer en la boquilla del Armed forces operational officer.  Coloque el inhalador de modo que la parte superior del envase quede hacia arriba y la boquilla del espaciador delante suyo.  Coloque el dedo ndice en la parte superior del envase del medicamento. El pulgar sostiene la parte inferior del inhalador y Engineer, civil (consulting).  Espire (exhale) normalmente y lo ms profundamente posible.  Inmediatamente despus de exhalar, coloque el espaciador entre sus dientes y [place] de su boca. Apriete los labios alrededor Therapist, sports.  Presione el envase hacia abajo con el dedo ndice para  liberar el medicamento.  Al mismo tiempo que presiona el envase, inhale profunda y lentamente hasta que los pulmones se llenen completamente. Esto debe llevarle de 4 a 6segundos. Mantenga la lengua abajo y [place].  Mantenga el medicamento en los pulmones entre 5 y 10segundos (10segundos es lo ms indicado). Esto ayuda a que el medicamento ingrese en las vas respiratorias pequeas y en los pulmones. Exhale.  Repita la inhalacin profundamente a travs de la boquilla del Armed forces training and education officer. Contenga la respiracin por lo menos 10 segundos (10 segundos es lo ms indicado). Exhale lentamente. Si le resulta difcil tomar esta segunda inhalacin profunda a travs del espaciador,  respire normalmente varias veces a travs del mismo. Retire el espaciador de su boca.  Entre cada descarga, espere de 15 a 30 segundos, como mnimo. Contine con los pasos indicados anteriormente hasta que haya tomado el nmero de descargas que el mdico le indic. No use el inhalador ms veces de las que su mdico le indique.  Retire Engineer, civil (consulting) del Armed forces operational officer y colquele la tapa al Armed forces operational officer.  Siga las indicaciones de su mdico o las instrucciones del manual para Building control surveyor y Engineer, civil (consulting). Si [place] diferentes tipos de inhaladores, use el medicamento de alivio rpido para abrir las vas respiratorias 10 a antes de usar un corticoide, si el mdico se lo indic. Si no est seguro de Engineer, maintenance y le indicaron usarlo, consulte a su mdico, enfermera o terapeuta en vas respiratorias. Si [place] un inhalador con corticoides, enjuguese siempre la boca con agua despus de la ltima descarga, hgase grgaras y escupa el agua. No trague el agua. EVITE LO SIGUIENTE:  Inhalar antes o despus de comenzar el aerosol del medicamento. Es necesario tener prctica para coordinar la respiracin con la emisin del aerosol.  Al emitir el aerosol, inhale por la nariz (ms que por la boca). CMO SABER SI EL INHALADOR EST LLENO O VACO No puede saber cundo un inhalador est vaco al sacudirlo. Algunos inhaladores ahora vienen con contador de dosis. Pdale a su mdico que le recete el que tiene el contador de dosis si siente que necesita ayuda extra. Si el inhalador no tiene Medical illustrator, pdale a su mdico que lo ayude a Development worker, community a llenarlo. Anote la fecha en que deber rellenarlo en un almanaque o en el envase del inhalador. Rellene el inhalador de 7 a 10das antes de que se termine. Asegrese de Pharmacologist un adecuado suministro del medicamento. Esto incluye asegurarse de que no est vencido y Nurse, children's de repuesto.  SOLICITE ATENCIN MDICA SI:   Los  sntomas solo se Scientific laboratory technician con Therapist, nutritional.  Tiene dificultad para Academic librarian.  Experimenta un leve aumento de la flema. SOLICITE ATENCIN MDICA DE INMEDIATO SI:   Siente poco alivio con el uso de los [place], o no tiene [place]. Contina con sibilancias y siente que le falta el aire, tiene opresin en el [place], o ambos.  Siente mareos, dolor de cabeza o una frecuencia cardaca acelerada.  Siente escalofros, fiebre o sudores nocturnos.  Nota un aumento en la produccin de flema o hay sangre en la flema. ASEGRESE DE QUE:   Comprende estas instrucciones.  Controlar su afeccin.  Recibir ayuda de inmediato si no mejora o si empeora. Document Released: 07/12/2005 Document Revised: 11/26/2013 [facility] Patient Information 2015 [place], [place]. This information is not intended to replace advice given to you by your health care provider. Make sure you  discuss any questions you have with your health care provider.  Hipertiroidismo (Hyperthyroidism) La tiroides es una glndula grande ubicada en la parte anterior e inferior del cuello. La tiroides interviene [employer] control del metabolismo. El metabolismo es el modo en que el organismo utiliza los alimentos. El control del metabolismo se realiza a travs de una hormona denominada tiroxina. Cuando la tiroides es hiperactiva, produce demasiada hormona. Cuando esto ocurre pueden surgir los siguientes problemas:   Nerviosismo  Intolerancia al calor  Prdida de peso (a pesar del aumento de la ingesta de comida)  Diarrea  Cambios en la textura del cabello o de la piel  Palpitaciones (falta de algunos latidos cardacos o latidos extra)  Taqu[place]ardia (frecuencia cardaca acelerada)  Falta de menstruacin (amenorrea)  Temblor en las manos CAUSAS  Enfermedad de [place] (el sistema inmune ataca a la glndula tiroides). sta es la causa ms frecuente.  Inflamacin de la glndula tiroides.  Tumor (generalmente  benigno) de la glndula tiroides o Scientist, water quality.  Uso excesivo de medicamentos para la tiroides (tanto prescriptos como 'naturales')  Ingesta excesiva de iodo. DIAGNSTICO Para confirmar el hipertiroidismo, el profesional que lo asiste le solicitar anlisis de sangre y estudios con [employer]. Algunas veces los signos estn ocultos. Puede ser [employer] profesional controle la enfermedad con exmenes de [place], ya sea antes o despus del diagnstico y [place]. TRATAMIENTO Tratamiento a Web designer varios tratamientos para Risk manager. Los medicamentos beta bloqueantes podrn proporcionar cierto alivio. Los medicamentos que disminuyen la produccin de hormonas proporcionarn a Neurosurgeon temporal Estas medidas en general no ofrecen Architect. Tratamiento definitivo  Se dispone de varios tratamientos que el profesional que lo asiste podr Agricultural engineer con usted y que tratarn el problema de [place]. Estos tratamientos varan desde la ciruga (extirpacin de la tiroides) o el uso de yodo radiactivo (que destruye la tiroides por radiacin), hasta el uso de medicamentos antitiroideos (que interfieren en la sntesis de la hormona). Los dos primeros tratamientos son permanentes y Soil scientist. Habitualmente requieren el suministro de hormona de por vida. Esto es debido a que es imposible retirar o destruir la cantidad Secondary school teacher de tiroides que se necesita para que la persona quede eutiroidea (normal). INSTRUCCIONES PARA EL CUIDADO DOMICILIARIO Consulte con el profesional que lo asiste si el problema por el que lo trata empeora. Algunos ejemplos seran los trastornos ya mencionados. SOLICITE ATENCIN MDICA SI: El trastorno general empeora. EST SEGURO QUE:   Comprende las instrucciones para el alta mdica.  Controlar su enfermedad.  Solicitar atencin mdica de inmediato segn las indicaciones. Document Released:  07/12/2005 Document Revised: 10/04/2011 [facility] Patient Information 2015 [place], [place]. This information is not intended to replace advice given to you by your health care provider. Make sure you discuss any questions you have with your health care provider.  Dejar de fumar, consejos para lograrlo (Smoking Cessation, Tips for Success) Si est preparado para dejar de fumar, felicitaciones! Ha elegido tener una vida ms sana. El cigarrillo aporta nicotina, alquitrn, monxido de carbono y otras sustancias irritantes al organismo. Los pulmones, el corazn y los vasos sanguneos funcionarn mejor sin estas sustancias txicas. Hay muchas formas de dejar de fumar. La goma de [place], los parches, los inhaladores con nicotina o un aerosol nasal lo ayudarn a Insurance underwriter deseo fsico. La hipnosis, los grupos de apoyo y algunos medicamentos tambin pueden ayudarlo. QU PUEDO HACER PARA QUE SEA MS FCIL?  A continuacin le brindamos algunos consejos:  Seleccione una fecha en la que dejar de fumar por completo. Cuntele a sus amigos y familiares su plan de abandonar el hbito en esa fecha.  No intente reducir progresivamente la cantidad de cigarrillos que fuma. Elija una fecha para dejar y abandone el hbito por completo desde ese da.  Deseche todos los cigarrillos.  Limpie y [address] de todos los ceniceros de su casa, su lugar de [place] y su automvil.  Escriba los motivos para abandonar el hbito en una tarjeta. Lleve esa tarjeta encima y lala cuando sienta ganas de fumar.  Elimine la nicotina de su organismo. Beba gran cantidad de lquido para mantener la orina de tono claro o color amarillo plido. Hgalo despus de abandonar para limpiar la nicotina de su organismo.  Aprenda a predecir sus estados de nimo. No deje que un mal momento sea una excusa para fumar. Algunas situaciones podrn tentarlo para fumar.  Nunca diga "solo uno". Lo llevar a querer otro ms y otro ms. Recurdese a usted  mismo su decisin de dejar de fumar.  Modifique los hbitos asociados con el cigarrillo. Si fuma mientras conduce o cuando se siente estresado, busque otras actividades que reemplacen al cigarrillo. Beba el caf de pie. Cepllese los dientes despus de comer. Sintese en una silla diferente para leer el peridico. Evite beber alcohol mientras intenta abandonar y consuma menor cantidad de bebidas que contengan cafena. El alcohol y la cafena podran darle ganas de fumar.  Evite los alimentos y las bebidas que puedan activar el deseo de fumar, por ejemplo, el alcohol y los alimentos dulces o picantes.  Pdale a la gente que fuma que no lo haga a su alrededor.  Planifique algo para hacer ni bien termina de comer o cuando toma una taza de caf. Por ejemplo, dar una caminata o hacer ejercicio.  Practique ejercicios de relajacin para calmarse y Theatre manager. Recuerde, puede estar tenso y [employer] primeras semanas pero esto pasar.  Encuentre nuevas actividades para Lowe[employer]. Juegue con Neomia Dear lapicera, una moneda o una bandita elstica. Garabatee o dibuje en un papel.  Cepllese los dientes inmediatamente despus de comer. Esto lo ayudar a Land deseo del tabaco luego de las comidas. Tambin puede usar enjuague bucal.  Use sustitutos orales para Chief Strategy Officer. Pruebe con caramelos de limn, zanahorias, ramas de canela o goma de mascar. Tngalos a mano para poder recurrir a ellos cuando sienta deseos de fumar.  Cuando tenga ganas de fumar, haga una respiracin profunda.  Designe su hogar como rea de no fumadores.  Si fuma mucho, solicite a su mdico que le recete goma de Theatre manager con nicotina. Puede ser til para atravesar el perodo de abstinencia de nicotina.  Recompnsese. Ahorre el dinero que usara para comprar cigarrillos y cmprese algo que le agrade.  Busque apoyo en [employer]. nase a un grupo de apoyo o programa para dejar  de fumar. Pdale a otra persona de su hogar o del trabajo que lo ayude con su plan para dejar de fumar.  Siempre pregntese: "Cabin crew o solo es un reflejo?" Dgase a s mismo: "Hoy elijo no fumar" o "No quiero fumar". Es una forma de recordarse su decisin de dejar de fumar.  No reemplace los cigarrillos comunes por cigarrillos electrnicos. La seguridad de los Administrator, Civil Service se desconoce, y podran tener qumicos txicos.  Si sufre una recada no abandone! Anticpese, planifique y piense qu har la prxima vez que sienta deseos de fumar.  CMO ME SENTIR CUANDO DEJE DE FUMAR? Podr sufrir sndrome de abstinencia porque su cuerpo est acostumbrado a la nicotina (la sustancia adictiva que contienen los cigarrillos). Podr sentir el deseo compulsivo de fumar, irritabilidad, enojo, tos, cefaleas y dificultad para concentrarse. Estos sntomas son transitorios. Son ms intensos al principio, pero desaparecern en 10 a 14 das. Cuando aparece el sndrome de abstinencia, mantenga el control. Piense en los motivos que tiene para dejar de fumar. Recurdese a usted mismo que estos son signos de que su organismo se est curando y se est acostumbrando a vivir sin fumar. Recuerde que el sndrome de abstinencia es ms fcil de tratar [employer] graves enfermedades que causa el cigarrillo.  Aun despus que los sntomas de abstinencia hayan desaparecido, habr momentos en los que tendr ganas de fumar. Pero este deseo ser breve y desaparecer, fume o no. No fume! CULES SON LOS RECURSOS DISPONIBLES PARA ABANDONAR EL HBITO DE FUMAR? Su mdico puede indicarle recursos comunitarios u hospitales donde recibir apoyo, por ejemplo:  Grupos de apoyo.  Educacin.  Hipnosis.  Terapia. Document Released: 12/29/2007 Document Revised: 11/26/2013 [facility] Patient Information 2015 [place], [place]. This information is not intended to replace advice given to you by your health care provider. Make  sure you discuss any questions you have with your health care provider.  Usted puede dejar de fumar (You Can Quit Smoking) Si usted est listo para dejar de fumar o est pensando en ello, felicitaciones! Ha elegido tener una vida ms sana y vivir ms tiempo. Hay muchas formas de dejar de fumar. Los chicles, parches, inhaladores o aerosol nasal con nicotina pueden ayudarlo con el deseo fsico. La hipnosis, los grupos de apoyo y algunos medicamentos tambin pueden ayudarlo. CONSEJOS PARA DEJAR EL HBITO Y NO VOLVER A FUMAR  Aprenda a predecir sus estados de nimo. Evite que un mal momento sea una excusa para fumar. Algunas situaciones podrn tentarlo.  Pdale a sus amigos y compaeros que no fumen a su alrededor.  Haga de su hogar un lugar libre de humo.  Nunca diga "solo uno". Esto lleva a desear otro y otro. Recurdese a usted mismo su decisin de dejar de fumar.  En un papel, haga una lista de sus razones para no fumar. Lalo al menos el mismo nmero de veces al da en que fume un cigarrillo. Dgase a usted [employer], "No quiero fumar Elijo no fumar "  Pdale a otra persona de su hogar o del trabajo que lo ayude con su plan para dejar de fumar.  Planifique algo para hacer enseguida despus de comer o cuando toma una taza de caf. Salga a caminar o haga algn ejercicio para animarse. Esto lo ayudar a Environmental health practitioner.  Haga ejercicios de relajacin para calmarse y Theatre manager. Recuerde, puede estar tenso y [employer] primeras semanas. Esto pasar.  Encuentre nuevas actividades para [employer] ocupadas Juegue con [place] lapicera, una moneda o una bandita elstica. Garabatee o dibuje en un papel.  Cepllese los dientes enseguida despus de comer. Esto lo ayudar a Land deseo del tabaco luego de las comidas. Tambin puede probar con un enjuague bucal.  Pruebe con caramelos de goma, pastillas de menta, o caramelos dietticos para mantener algo en  la boca. SI USTED FUMA Y QUIERE DEJAR EL HBITO:  Evite abastecerse de cigarrillos. Nunca compre un cartn. Espere a que un paquete se termine antes de Multimedia programmer.  Nunca lleve los cigarrillos con usted en  el trabajo o en su casa.  Mantngalos lo ms lejos posible. Djeselos a Engineer, maintenance (IT)otra persona.  Nunca lleve los fsforos o el encendedor con usted.  Siempre pregntese: "Necesito este cigarrillo o slo es un reflejo?"  Haga una apuesta con alguien a que puede dejar de fumar. Ponga el dinero de los cigarrillos en una alcanca cada [place]. Si usted fuma, pierde el dinero. Si no fuma, al final de la semana, tendr ms dinero.  Siga intentando. Se tarda 21 das en cambiar un hbito!  Hable con su mdico acerca del uso de medicamentos para dejar de fumar. Entre ellos se incluyen la goma de Theatre managermascar, Designer, industrial/productpastillas o parches para la piel como reemplazo de la nicotina. Document Released: 08/14/2010 Document Revised: 10/04/2011 [facility] Patient Information 2015 [place]. This information is not intended to replace advice given to you by your health care provider. Make sure you discuss any questions you have with your health care provider.

## 2014-08-29 NOTE — ED Notes (Signed)
thye pt is c/o a cough for 3 weeks.  Productive green.  And she has some posterioir rt chest pain alos.  Unsure if she has had a temp.  She vomits whenever she coughs up sputum in the back of her throat.  lmp  Jan 30th

## 2014-09-05 ENCOUNTER — Ambulatory Visit: Payer: No Typology Code available for payment source | Admitting: Internal Medicine

## 2014-10-31 ENCOUNTER — Encounter: Payer: No Typology Code available for payment source | Admitting: Internal Medicine

## 2015-11-24 ENCOUNTER — Encounter (HOSPITAL_COMMUNITY): Payer: Self-pay | Admitting: *Deleted

## 2015-11-24 ENCOUNTER — Other Ambulatory Visit (HOSPITAL_COMMUNITY): Payer: Self-pay | Admitting: *Deleted

## 2015-11-24 DIAGNOSIS — N644 Mastodynia: Secondary | ICD-10-CM

## 2015-12-04 ENCOUNTER — Telehealth: Payer: Self-pay | Admitting: Internal Medicine

## 2015-12-04 NOTE — Telephone Encounter (Signed)
PT sister came in the office asking if the PT can get a refill on her Thyroid medication.  She also was asking if she could be seen at a local Clinic for her thyroid issues because she does not have a PCP or insurance. CB# [phone]

## 2015-12-04 NOTE — Telephone Encounter (Signed)
I have not seen this patient for almost 2 years.I will need to see her and check labs before refilling her medicines.

## 2015-12-04 NOTE — Telephone Encounter (Signed)
Please read message below and advise.  

## 2015-12-05 NOTE — Telephone Encounter (Signed)
Called pt and spoke with her. Advised her that Dr Elvera LennoxGherghe cannot refill her medication, she has not been seen in 2 yrs. [facility] Pt said she understood.

## 2015-12-18 ENCOUNTER — Ambulatory Visit (HOSPITAL_COMMUNITY)
Admission: RE | Admit: 2015-12-18 | Discharge: 2015-12-18 | Disposition: A | Discharge status: Home / Self Care | Payer: Self-pay | Source: Ambulatory Visit | Attending: Obstetrics and Gynecology | Admitting: Obstetrics and Gynecology

## 2015-12-18 ENCOUNTER — Ambulatory Visit
Admission: RE | Admit: 2015-12-18 | Discharge: 2015-12-18 | Disposition: A | Discharge status: Home / Self Care | Payer: No Typology Code available for payment source | Source: Ambulatory Visit | Attending: Obstetrics and Gynecology | Admitting: Obstetrics and Gynecology

## 2015-12-18 DIAGNOSIS — N644 Mastodynia: Secondary | ICD-10-CM

## 2016-01-15 ENCOUNTER — Encounter (HOSPITAL_COMMUNITY): Payer: Self-pay

## 2016-01-15 ENCOUNTER — Ambulatory Visit (HOSPITAL_COMMUNITY)
Admission: RE | Admit: 2016-01-15 | Discharge: 2016-01-15 | Disposition: A | Discharge status: Home / Self Care | Payer: Self-pay | Source: Ambulatory Visit | Attending: Obstetrics and Gynecology | Admitting: Obstetrics and Gynecology

## 2016-01-15 ENCOUNTER — Ambulatory Visit: Admission: RE | Admit: 2016-01-15 | Payer: No Typology Code available for payment source | Source: Ambulatory Visit

## 2016-01-15 ENCOUNTER — Ambulatory Visit
Admission: RE | Admit: 2016-01-15 | Discharge: 2016-01-15 | Disposition: A | Discharge status: Home / Self Care | Payer: No Typology Code available for payment source | Source: Ambulatory Visit | Attending: Obstetrics and Gynecology | Admitting: Obstetrics and Gynecology

## 2016-01-15 VITALS — BP 118/62 | Temp 98.8°F | Ht 63.0 in | Wt 142.0 lb

## 2016-01-15 DIAGNOSIS — N644 Mastodynia: Secondary | ICD-10-CM

## 2016-01-15 DIAGNOSIS — Z1239 Encounter for other screening for malignant neoplasm of breast: Secondary | ICD-10-CM

## 2016-01-15 HISTORY — DX: Essential (primary) hypertension: I10

## 2016-01-15 NOTE — Progress Notes (Addendum)
Complaints of pain in bilateral breast x one year that is getting worse. Patient stated the pain is worse when touched. Patient rates pain at a 5 out of 10. Patient stated there were some lumps within the left breast that disappeared. Patient stated she had some milky discharge from her right breast four months ago that has went away.   Pap Smear:  Pap smear not completed today. [facility] Pap [facility] and normal. Per patient has no history of an abnormal Pap smear. No Pap smear results are in EPIC.  Physical exam: Breasts Breasts symmetrical. No skin abnormalities bilateral breasts. No nipple retraction bilateral breasts. No nipple discharge bilateral breasts. No lymphadenopathy. No lumps palpated bilateral breasts. Complaints of left outer and lower tenderness on exam. Complaints of entire right breast being sore on exam. [facility] for [facility]. Appointment scheduled for Thursday, January 15, 2016 at 1250.        Pelvic/Bimanual No Pap smear completed today since last Pap smear was 11/12/2015. Pap smear not indicated per BCCCP guidelines.   Smoking History: Patient is a current smoker. Smoking cessation discussed. Referred patient to the [facility] and gave resources to free classes offered at the [facility].  Patient Navigation: Patient education provided. Access to services provided for patient through [facility] program. Spanish interpreter provided.  Used Spanish interpreter [employer] [place] from [place].

## 2016-01-15 NOTE — Patient Instructions (Addendum)
Educational materials on self breast awareness given. Explained to Tracy Carson that she did not need a Pap smear today due to last Pap smear was 11/12/2015.  Let her know BCCCP will cover Pap smears every 3 years unless has a history of abnormal Pap smears. [facility] for [facility]. Appointment scheduled for Thursday, January 15, 2016 at 1250. Smoking cessation discussed. Referred [facility] to the [facility] and gave resources to free classes offered at the [facility]. Tracy Carson verbalized understanding.  [place], Kathaleen Maserhristine Poll, RN 1:39 PM

## 2016-01-19 ENCOUNTER — Encounter (HOSPITAL_COMMUNITY): Payer: Self-pay | Admitting: *Deleted

## 2016-02-26 ENCOUNTER — Ambulatory Visit: Payer: No Typology Code available for payment source | Attending: Internal Medicine

## 2016-03-17 ENCOUNTER — Telehealth: Payer: Self-pay | Admitting: Internal Medicine

## 2016-03-17 NOTE — Telephone Encounter (Signed)
PT came in to schedule an appointment, she stated she recently was [facility].  [facility]. I gave her the first available 30 minute slot, which is in October.  She requests call back.

## 2016-03-18 ENCOUNTER — Other Ambulatory Visit: Payer: Self-pay

## 2016-03-18 ENCOUNTER — Other Ambulatory Visit (INDEPENDENT_AMBULATORY_CARE_PROVIDER_SITE_OTHER): Payer: No Typology Code available for payment source

## 2016-03-18 DIAGNOSIS — E0500 Thyrotoxicosis with diffuse goiter without thyrotoxic crisis or storm: Secondary | ICD-10-CM

## 2016-03-18 LAB — TSH: TSH: 0.01 u[IU]/mL — ABNORMAL LOW (ref 0.35–4.50)

## 2016-03-18 LAB — T4, FREE: Free T4: 5.92 ng/dL — ABNORMAL HIGH (ref 0.60–1.60)

## 2016-03-18 LAB — T3, FREE

## 2016-03-18 NOTE — Telephone Encounter (Signed)
Patient is calling on the status of her refill, please advise

## 2016-03-18 NOTE — Telephone Encounter (Signed)
Need a TSH, free T4, free T3 as soon as possible before refilling methimazole.

## 2016-03-18 NOTE — Telephone Encounter (Signed)
Called and spoke with patient and daughter, put in lab orders, patient daughter stated that mother had no questions or concerns, and [facility], and then after if all was good we would order patient medication.

## 2016-03-19 ENCOUNTER — Other Ambulatory Visit: Payer: Self-pay

## 2016-03-19 DIAGNOSIS — E0500 Thyrotoxicosis with diffuse goiter without thyrotoxic crisis or storm: Secondary | ICD-10-CM

## 2016-03-19 MED ORDER — METHIMAZOLE 10 MG PO TABS: 20.0000 mg | ORAL_TABLET | Freq: Two times a day (BID) | ORAL | 1 refills | Status: DC

## 2016-03-19 NOTE — Telephone Encounter (Signed)
Called patient through interpreter line, she did not answer; I advised to leave message and told patient about lab work, and that we had sent in medication to start taking it immediatly, also advised patient we needed to see her 2 weeks before her scheduled appointment which would be Oct 10th that week. Will try again if I do not hear from patient.

## 2016-03-19 NOTE — Telephone Encounter (Signed)
Called by [facility]. They state that [facility]. Reviewed notes on needing labs. Was unable to refill notes- which appears was communicated to her that she needed labs. Unclear what issue is- nurse from [facility] call patient and instruct to call [facility] office on Monday to get set up with labs    Darene LamerJulie Thompson, LPN      0/98/118/24/17 1:25 PM  Note    Called and spoke with patient and daughter, put in lab orders, patient daughter stated that mother had no questions or concerns, and [facility], and then after if all was good we would order patient medication.

## 2016-03-22 ENCOUNTER — Telehealth: Payer: Self-pay | Admitting: Internal Medicine

## 2016-03-22 ENCOUNTER — Other Ambulatory Visit: Payer: Self-pay

## 2016-03-22 DIAGNOSIS — E0500 Thyrotoxicosis with diffuse goiter without thyrotoxic crisis or storm: Secondary | ICD-10-CM

## 2016-03-22 MED ORDER — METHIMAZOLE 10 MG PO TABS: 20.0000 mg | ORAL_TABLET | Freq: Two times a day (BID) | ORAL | 1 refills | Status: DC

## 2016-03-22 NOTE — Telephone Encounter (Signed)
Patient stated pharmacy [facility] haven't received the prescription for thyroid medication, please advise.

## 2016-03-22 NOTE — Telephone Encounter (Signed)
The Rx was sent and was received by pharmacy:  methimazole (TAPAZOLE) 10 MG tablet 180 tablet 1 03/19/2016    Sig - Route: Take 2 tablets (20 mg total) by mouth 2 (two) times daily. - Oral   E-Prescribing Status: Receipt confirmed by pharmacy (03/19/2016 1:55 PM EDT)    Raynelle FanningJulie, can you please clarify with the pt if she got the medication? Thank you, C

## 2016-03-22 NOTE — Telephone Encounter (Signed)
[facility], attempted to contact patient to notify her we had submitted it again, no answer and no voicemail will try again later to make sure patient picks up medications.

## 2016-03-23 ENCOUNTER — Other Ambulatory Visit: Payer: Self-pay

## 2016-03-23 NOTE — Telephone Encounter (Signed)
Called pharmacy and asked if medication was at pharmacy, it was there. Patient needs to go pick up medication [facility]. I tried to call back to notify her, again no answer and no voicemail.

## 2016-03-23 NOTE — Telephone Encounter (Signed)
Patient have not receive the methimazole (TAPAZOLE) 10 MG tablet medication, please resend

## 2016-05-05 ENCOUNTER — Ambulatory Visit: Payer: No Typology Code available for payment source | Admitting: Internal Medicine

## 2016-05-18 ENCOUNTER — Ambulatory Visit: Payer: No Typology Code available for payment source | Admitting: Internal Medicine

## 2016-06-11 ENCOUNTER — Encounter: Payer: Self-pay | Admitting: Internal Medicine

## 2016-06-11 ENCOUNTER — Ambulatory Visit: Payer: Self-pay | Admitting: Internal Medicine

## 2016-06-11 DIAGNOSIS — Z0289 Encounter for other administrative examinations: Secondary | ICD-10-CM

## 2016-11-08 ENCOUNTER — Inpatient Hospital Stay (HOSPITAL_COMMUNITY)
Admission: EM | Admit: 2016-11-08 | Discharge: 2016-11-10 | DRG: 645 | Disposition: A | Discharge status: Home / Self Care | Payer: Self-pay | Attending: Internal Medicine | Admitting: Internal Medicine

## 2016-11-08 ENCOUNTER — Encounter (HOSPITAL_COMMUNITY): Payer: Self-pay | Admitting: Emergency Medicine

## 2016-11-08 ENCOUNTER — Emergency Department (HOSPITAL_COMMUNITY): Payer: Self-pay

## 2016-11-08 DIAGNOSIS — R000 Tachycardia, unspecified: Secondary | ICD-10-CM | POA: Diagnosis present

## 2016-11-08 DIAGNOSIS — R002 Palpitations: Secondary | ICD-10-CM | POA: Diagnosis present

## 2016-11-08 DIAGNOSIS — F17210 Nicotine dependence, cigarettes, uncomplicated: Secondary | ICD-10-CM | POA: Diagnosis present

## 2016-11-08 DIAGNOSIS — Z79899 Other long term (current) drug therapy: Secondary | ICD-10-CM

## 2016-11-08 DIAGNOSIS — E0590 Thyrotoxicosis, unspecified without thyrotoxic crisis or storm: Secondary | ICD-10-CM | POA: Diagnosis present

## 2016-11-08 DIAGNOSIS — I10 Essential (primary) hypertension: Secondary | ICD-10-CM | POA: Diagnosis present

## 2016-11-08 DIAGNOSIS — E0500 Thyrotoxicosis with diffuse goiter without thyrotoxic crisis or storm: Principal | ICD-10-CM | POA: Diagnosis present

## 2016-11-08 DIAGNOSIS — R0789 Other chest pain: Secondary | ICD-10-CM

## 2016-11-08 MED ORDER — ADENOSINE 6 MG/2ML IV SOLN
6.0000 mg | Freq: Once | INTRAVENOUS | Status: AC
  Administered 2016-11-08: 6 mg via INTRAVENOUS

## 2016-11-08 MED ORDER — SODIUM CHLORIDE 0.9 % IV BOLUS (SEPSIS)
1000.0000 mL | Freq: Once | INTRAVENOUS | Status: AC
  Administered 2016-11-08: 1000 mL via INTRAVENOUS

## 2016-11-08 MED ORDER — PROPRANOLOL HCL 1 MG/ML IV SOLN
1.0000 mg | INTRAVENOUS | Status: AC
  Administered 2016-11-08 (×2): 1 mg via INTRAVENOUS
  Filled 2016-11-08 (×3): qty 1

## 2016-11-08 NOTE — ED Provider Notes (Addendum)
TIME SEEN: 11:16 PM   11/08/2016  CHIEF COMPLAINT: Tachycardia  HPI: This is a 36 year old Spanish speaking female with history of Grave's disease and hypertension who presents to the ED with tachycardia. According to the patient, she has had 1 week of intermittent L sided chest pain and dyspnea. In the last few hours her symptoms worsened and she presented to triage. There her pulse rate was maximally at 220 bpm. Brought to a trauma bay. At interview she is reporting 3/10 chest pain that has resolved. Still having shortness of breath. When asked, she is reporting sweating and weight for "5 years" since her thyroid disease diagnosis. Patient feeling flushed, hot, sweaty intermittently for a week with palpitations.  Denies history of PE/DVT. No leg swelling. No recent fever, cough, vomiting, diarrhea, bloody stools, melena.  PCP - [facility]?  ROS: See HPI Constitutional: no fever +weight loss, sweating, hypersensitivity to warmth Eyes: no drainage  ENT: no runny nose   Cardiovascular:  + chest pain  Resp: + SOB  GI: no vomiting GU: no dysuria Integumentary: no rash  Allergy: no hives  Musculoskeletal: no leg swelling  Neurological: no slurred speech ROS otherwise negative  PAST MEDICAL HISTORY/PAST SURGICAL HISTORY:  Past Medical History:  Diagnosis Date  . Hypertension   . Thyroid disease   . Tuberculosis    as a child    MEDICATIONS:  Prior to Admission medications   Medication Sig Start Date End Date Taking? Authorizing Provider  albuterol (PROVENTIL HFA;VENTOLIN HFA) 108 (90 BASE) MCG/ACT inhaler Inhale 1-2 puffs into the lungs every 4 (four) hours as needed for wheezing or shortness of breath. Patient not taking: Reported on 01/15/2016 08/29/14   Marisa Severin, MD  atenolol (TENORMIN) 50 MG tablet Take 1 tablet (50 mg total) by mouth 2 (two) times daily. Patient not taking: Reported on 01/15/2016 03/22/14   Carlus Pavlov, MD  benzonatate (TESSALON) 200 MG capsule  Take 1 capsule (200 mg total) by mouth 3 (three) times daily as needed for cough. Patient not taking: Reported on 01/15/2016 08/29/14   Marisa Severin, MD  dextromethorphan-guaiFENesin [facility] DM) 30-600 MG per 12 hr tablet Take 1 tablet by mouth 2 (two) times daily as needed for cough. Patient not taking: Reported on 01/15/2016 08/29/14   Marisa Severin, MD  hydrocortisone (ANUSOL-HC) 25 MG suppository Place 1 suppository (25 mg total) rectally 2 (two) times daily as needed for hemorrhoids. Patient not taking: Reported on 08/29/2014 11/04/12   Sorin Luanne Bras, MD  iron polysaccharides (NIFEREX) 150 MG capsule Take 1 capsule (150 mg total) by mouth 2 (two) times daily. Patient not taking: Reported on 08/29/2014 11/04/12   Sorin Luanne Bras, MD  methimazole (TAPAZOLE) 10 MG tablet Take 2 tablets (20 mg total) by mouth 2 (two) times daily. 03/22/16   Carlus Pavlov, MD  polyethylene glycol (MIRALAX / GLYCOLAX) packet Take 17 g by mouth daily. Patient not taking: Reported on 08/29/2014 11/04/12   Sorin Luanne Bras, MD    ALLERGIES:  No Known Allergies  SOCIAL HISTORY:  Social History  Substance Use Topics  . Smoking status: Current Some Day Smoker    Packs/day: 0.25    Years: 1.00    Types: Cigarettes  . Smokeless tobacco: Never Used  . Alcohol use No    FAMILY HISTORY: Family History  Problem Relation Age of Onset  . Diabetes Mother   . Thyroid disease Paternal Grandmother     EXAM: Pulse (!) 182   Temp  98.3 F (36.8 C) (Oral)   Resp (!) 25   SpO2 100%  CONSTITUTIONAL: Alert and oriented and responds appropriately to questions. Appears anxious. HEAD: Normocephalic EYES: Conjunctivae clear, pupils appear equal, EOMI ENT: normal nose; moist mucous membranes NECK: Supple, no meningismus, no nuchal rigidity, no LAD  CARD: Tachycardic with regular rhythm. No murmurs. No gallops or rubs. RESP: Normal chest excursion without splinting or tachypnea; breath sounds clear and equal bilaterally; no wheezes, no  rhonchi, no rales, no hypoxia or respiratory distress, speaking full sentences ABD/GI: Normal bowel sounds; non-distended; soft, non-tender, no rebound, no guarding, no peritoneal signs, no hepatosplenomegaly BACK:  The back appears normal and is non-tender to palpation, there is no CVA tenderness EXT: Normal ROM in all joints; non-tender to palpation; no edema; normal capillary refill; no cyanosis, no calf tenderness or swelling    SKIN: Normal color for age and race; warm; no rash NEURO: Moves all extremities equally PSYCH: The patient's mood and manner are appropriate. Grooming and personal hygiene are appropriate.  MEDICAL DECISION MAKING: Patient here with tachycardia. Appears to be sinus. Adenosine and vagal maneuvers attempted without any change in her rhythm. Given history of hyperthyroidism, patient given propanolol 1 mg IV 2 doses and heart rate improved into the 90s. Concern for thyroid storm, hyperthyroidism. Less likely ACS given age and no risk factors. PE also on the differential. No recent infectious symptoms. Does not appear dehydrated. We'll obtain labs, urine, chest x-ray. We'll continue IV hydration. Anticipate admission.  ED PROGRESS: Patient's potassium is 3.5, magnesium 1.5. We will replace these electrolytes to optimize. Pregnancy test negative. Hemoglobin normal. D-dimer and troponin negative. Chest x-ray clear. Heart rate is still in the 90s. TSH is undetectable, T4 elevated. Will admit.  Will give methimazole 20 mg orally.   2:13 AM Discussed patient's case with hospitalist, Dr. Julian Reil.  I have recommended admission and patient (and family if present) agree with this plan. Admitting physician will place admission orders.   I reviewed all nursing notes, vitals, pertinent previous records, EKGs, lab and urine results, imaging (as available).     EKG Interpretation  Date/Time:  Monday November 08 2016 23:04:46 EDT Ventricular Rate:  194 PR Interval:  98 QRS  Duration: 80 QT Interval:  238 QTC Calculation: 427 R Axis:   88 Text Interpretation:  Sinus tachycardia with short PR ST changes in inferior leads likely rate related Abnormal ECG Confirmed by Kiyonna Tortorelli,  DO, Gyan Cambre [phone]) on 11/08/2016 11:31:14 PM       EKG Interpretation  Date/Time:  Tuesday November 09 2016 00:00:56 EDT Ventricular Rate:  98 PR Interval:  98 QRS Duration: 87 QT Interval:  380 QTC Calculation: 486 R Axis:   82 Text Interpretation:  Sinus rhythm Borderline short PR interval Right atrial enlargement Consider left ventricular hypertrophy Borderline prolonged QT interval Confirmed by Maddon Horton,  DO, Inga Noller ([id number]) on 11/09/2016 12:05:06 AM        CRITICAL CARE Performed by: Raelyn Number   Total critical care time: 45 minutes  Critical care time was exclusive of separately billable procedures and treating other patients.  Critical care was necessary to treat or prevent imminent or life-threatening deterioration.  Critical care was time spent personally by me on the following activities: development of treatment plan with patient and/or surrogate as well as nursing, discussions with consultants, evaluation of patient's response to treatment, examination of patient, obtaining history from patient or surrogate, ordering and performing treatments and interventions, ordering and review of laboratory studies,  ordering and review of radiographic studies, pulse oximetry and re-evaluation of patient's condition.   I personally performed the services described in this documentation, which was scribed in my presence. The recorded information has been reviewed and is accurate.    Layla Maw Azelyn Batie, DO 11/09/16 1610    Layla Maw Kiandre Spagnolo, DO 11/09/16 [id number]

## 2016-11-08 NOTE — ED Triage Notes (Signed)
Pt started having cp in and out for a week getting worse today, some nausea. Pt HR on the 200 on arrival and transferred to trauma room.

## 2016-11-09 ENCOUNTER — Telehealth: Payer: Self-pay

## 2016-11-09 DIAGNOSIS — E0500 Thyrotoxicosis with diffuse goiter without thyrotoxic crisis or storm: Principal | ICD-10-CM

## 2016-11-09 DIAGNOSIS — E0580 Other thyrotoxicosis without thyrotoxic crisis or storm: Secondary | ICD-10-CM

## 2016-11-09 DIAGNOSIS — E0581 Other thyrotoxicosis with thyrotoxic crisis or storm: Secondary | ICD-10-CM

## 2016-11-09 DIAGNOSIS — E0590 Thyrotoxicosis, unspecified without thyrotoxic crisis or storm: Secondary | ICD-10-CM | POA: Diagnosis present

## 2016-11-09 LAB — RAPID URINE DRUG SCREEN, HOSP PERFORMED
AMPHETAMINES: NOT DETECTED
BARBITURATES: NOT DETECTED
BENZODIAZEPINES: NOT DETECTED
COCAINE: NOT DETECTED
Opiates: NOT DETECTED
TETRAHYDROCANNABINOL: NOT DETECTED

## 2016-11-09 LAB — COMPREHENSIVE METABOLIC PANEL
ALT: 30 U/L (ref 14–54)
AST: 29 U/L (ref 15–41)
Albumin: 3.3 g/dL — ABNORMAL LOW (ref 3.5–5.0)
Alkaline Phosphatase: 223 U/L — ABNORMAL HIGH (ref 38–126)
Anion gap: 8 (ref 5–15)
BUN: 9 mg/dL (ref 6–20)
CHLORIDE: 111 mmol/L (ref 101–111)
CO2: 20 mmol/L — ABNORMAL LOW (ref 22–32)
CREATININE: 0.44 mg/dL (ref 0.44–1.00)
Calcium: 8.6 mg/dL — ABNORMAL LOW (ref 8.9–10.3)
Glucose, Bld: 135 mg/dL — ABNORMAL HIGH (ref 65–99)
Potassium: 3.5 mmol/L (ref 3.5–5.1)
Sodium: 139 mmol/L (ref 135–145)
Total Bilirubin: 0.6 mg/dL (ref 0.3–1.2)
Total Protein: 5.9 g/dL — ABNORMAL LOW (ref 6.5–8.1)

## 2016-11-09 LAB — CBC WITH DIFFERENTIAL/PLATELET
Basophils Absolute: 0 10*3/uL (ref 0.0–0.1)
Basophils Relative: 0 %
Eosinophils Absolute: 0.2 10*3/uL (ref 0.0–0.7)
Eosinophils Relative: 3 %
HCT: 36.9 % (ref 36.0–46.0)
Hemoglobin: 12.7 g/dL (ref 12.0–15.0)
Lymphocytes Relative: 53 %
Lymphs Abs: 3.3 10*3/uL (ref 0.7–4.0)
MCH: 28 pg (ref 26.0–34.0)
MCHC: 34.4 g/dL (ref 30.0–36.0)
MCV: 81.5 fL (ref 78.0–100.0)
MONOS PCT: 8 %
Monocytes Absolute: 0.5 10*3/uL (ref 0.1–1.0)
Neutro Abs: 2.2 10*3/uL (ref 1.7–7.7)
Neutrophils Relative %: 36 %
PLATELETS: 126 10*3/uL — AB (ref 150–400)
RBC: 4.53 MIL/uL (ref 3.87–5.11)
RDW: 13.2 % (ref 11.5–15.5)
WBC: 6.1 10*3/uL (ref 4.0–10.5)

## 2016-11-09 LAB — TSH: TSH: <0.01 u[IU]/mL — ABNORMAL LOW (ref 0.350–4.500)

## 2016-11-09 LAB — I-STAT CG4 LACTIC ACID, ED: LACTIC ACID, VENOUS: 1.19 mmol/L (ref 0.5–1.9)

## 2016-11-09 LAB — HIV ANTIBODY (ROUTINE TESTING W REFLEX): HIV Screen 4th Generation wRfx: NONREACTIVE

## 2016-11-09 LAB — I-STAT CHEM 8, ED
BUN: 8 mg/dL (ref 6–20)
Calcium, Ion: 1.15 mmol/L (ref 1.15–1.40)
Chloride: 108 mmol/L (ref 101–111)
Creatinine, Ser: 0.4 mg/dL — ABNORMAL LOW (ref 0.44–1.00)
Glucose, Bld: 136 mg/dL — ABNORMAL HIGH (ref 65–99)
HCT: 36 % (ref 36.0–46.0)
HEMOGLOBIN: 12.2 g/dL (ref 12.0–15.0)
POTASSIUM: 3.4 mmol/L — AB (ref 3.5–5.1)
SODIUM: 141 mmol/L (ref 135–145)
TCO2: 19 mmol/L (ref 0–100)

## 2016-11-09 LAB — URINALYSIS, ROUTINE W REFLEX MICROSCOPIC
Bilirubin Urine: NEGATIVE
GLUCOSE, UA: NEGATIVE mg/dL
Hgb urine dipstick: NEGATIVE
Ketones, ur: NEGATIVE mg/dL
Leukocytes, UA: NEGATIVE
Nitrite: NEGATIVE
PH: 6 (ref 5.0–8.0)
Protein, ur: NEGATIVE mg/dL
SPECIFIC GRAVITY, URINE: 1.006 (ref 1.005–1.030)

## 2016-11-09 LAB — T4, FREE: Free T4: >5.5 ng/dL — ABNORMAL HIGH (ref 0.61–1.12)

## 2016-11-09 LAB — MAGNESIUM: Magnesium: 1.5 mg/dL — ABNORMAL LOW (ref 1.7–2.4)

## 2016-11-09 LAB — I-STAT TROPONIN, ED: TROPONIN I, POC: 0.01 ng/mL (ref 0.00–0.08)

## 2016-11-09 LAB — I-STAT BETA HCG BLOOD, ED (MC, WL, AP ONLY)

## 2016-11-09 LAB — D-DIMER, QUANTITATIVE (NOT AT ARMC): D DIMER QUANT: 0.28 ug{FEU}/mL (ref 0.00–0.50)

## 2016-11-09 MED ORDER — POTASSIUM CHLORIDE CRYS ER 20 MEQ PO TBCR
40.0000 meq | EXTENDED_RELEASE_TABLET | Freq: Once | ORAL | Status: AC
  Administered 2016-11-09: 40 meq via ORAL
  Filled 2016-11-09: qty 2

## 2016-11-09 MED ORDER — MAGNESIUM SULFATE 2 GM/50ML IV SOLN
2.0000 g | Freq: Once | INTRAVENOUS | Status: AC
  Administered 2016-11-09: 2 g via INTRAVENOUS
  Filled 2016-11-09: qty 50

## 2016-11-09 MED ORDER — METHIMAZOLE 10 MG PO TABS: 20.0000 mg | ORAL_TABLET | Freq: Two times a day (BID) | ORAL | Status: DC

## 2016-11-09 MED ORDER — PROPRANOLOL HCL 10 MG PO TABS
10.0000 mg | ORAL_TABLET | Freq: Four times a day (QID) | ORAL | Status: DC
  Administered 2016-11-09 – 2016-11-10 (×6): 10 mg via ORAL
  Filled 2016-11-09 (×7): qty 1

## 2016-11-09 MED ORDER — METHIMAZOLE 10 MG PO TABS
20.0000 mg | ORAL_TABLET | Freq: Once | ORAL | Status: AC
  Administered 2016-11-09: 20 mg via ORAL
  Filled 2016-11-09: qty 2

## 2016-11-09 MED ORDER — SODIUM CHLORIDE 0.9% FLUSH
3.0000 mL | Freq: Two times a day (BID) | INTRAVENOUS | Status: DC
  Administered 2016-11-09 (×2): 3 mL via INTRAVENOUS

## 2016-11-09 MED ORDER — ENOXAPARIN SODIUM 40 MG/0.4ML ~~LOC~~ SOLN: 40.0000 mg | SUBCUTANEOUS | Status: DC

## 2016-11-09 MED ORDER — METOPROLOL TARTRATE 25 MG PO TABS: 25.0000 mg | ORAL_TABLET | Freq: Two times a day (BID) | ORAL | Status: DC

## 2016-11-09 MED ORDER — METHIMAZOLE 10 MG PO TABS
20.0000 mg | ORAL_TABLET | Freq: Three times a day (TID) | ORAL | Status: DC
Start: 2016-11-09 — End: 2016-11-10
  Administered 2016-11-09 – 2016-11-10 (×4): 20 mg via ORAL
  Filled 2016-11-09 (×5): qty 2

## 2016-11-09 NOTE — Progress Notes (Signed)
Patient was admitted after midnight, please see H&P.  Patient has not had any insurance and therefore has not followed up with any providers.  Mother at bedside and patient seen with interpreter.  Mother says patient has not been taking as directed but has been spacing out meds to make the prescription last longer.  Have increased to TID for now.  Needs the orange cards, medications compliance and strict follow up.  Watch on tele overnight, suspect possible d/c in AM.  Marlin Canary DO

## 2016-11-09 NOTE — Progress Notes (Signed)
   11/09/16 0354  Vitals  Temp 98.3 F (36.8 C)  Temp Source Oral  BP 126/71  MAP (mmHg) 85  BP Location Right Arm  BP Method Automatic  Patient Position (if appropriate) Lying  Pulse Rate 87  Oxygen Therapy  SpO2 99 %  O2 Device Room Air  Admitted pt to rm [phone] from ED, pt alert and oriented, denied pain at this time, oriented to room, call bell placed within reach.

## 2016-11-09 NOTE — H&P (Signed)
History and Physical    Tracy Carson NWG:[id number] DOB: 23-Jul-1981 DOA: 11/08/2016  PCP: Default, Provider, MD  Patient coming from: Home  I have personally briefly reviewed patient's old medical records in [facility]  Chief Complaint: Palpitations  HPI: Tracy Carson is a 36 y.o. female with medical history significant of Graves disease with severe hyperthyroidism.  Thyrotoxicosis despite treatment with tapazole.  Last saw Dr. Wyonia Hough back in 2015 secondary to no insurance.  Dr. Wyonia Hough seemed to suggest RAI or thyroidectomy being considered at that time but not an option due to patient lack of insurance.  She states she has been compliant with tapazole, missing at most 1-2 doses a week.  She states she is taking as directed (2 pills in AM, 2 pills at night).  She states that she doesn't feel like it is having any effect.  Patient presents to the ED with 1 week history of intermittent palpitations, feeling flushed, hot, sweaty.  Nothing makes symptoms better or worse.   ED Course: Initially tachycardia to 180s, no response to adenosine, apparently S.Tach with slight variation in rate.  Improved with 2 rounds of 5mg  IV propanolol.  TSH is undetectable, and T4 is off the scale high.   Review of Systems: As per HPI otherwise 10 point review of systems negative.   Past Medical History:  Diagnosis Date  . Hypertension   . Thyroid disease   . Tuberculosis    as a child    Past Surgical History:  Procedure Laterality Date  . CESAREAN SECTION  07/02/2011   Procedure: CESAREAN SECTION;  Surgeon: Roseanna Rainbow, MD;  Location: [facility];  Service: Gynecology;  Laterality: N/A;  . CHOLECYSTECTOMY       reports that she has been smoking Cigarettes.  She has a 0.25 pack-year smoking history. She has never used smokeless tobacco. She reports that she does not drink alcohol or use drugs.  No Known Allergies  Family History  Problem Relation Age of Onset  . Diabetes  Mother   . Thyroid disease Paternal Grandmother      Prior to Admission medications   Medication Sig Start Date End Date Taking? Authorizing Provider  albuterol (PROVENTIL HFA;VENTOLIN HFA) 108 (90 BASE) MCG/ACT inhaler Inhale 1-2 puffs into the lungs every 4 (four) hours as needed for wheezing or shortness of breath. 08/29/14  Yes Marisa Severin, MD  methimazole (TAPAZOLE) 10 MG tablet Take 2 tablets (20 mg total) by mouth 2 (two) times daily. 03/22/16  Yes Carlus Pavlov, MD    Physical Exam: Vitals:   11/09/16 0115 11/09/16 0130 11/09/16 0145 11/09/16 0200  BP: 116/80 124/63 127/78 128/74  Pulse: 95 94 95 93  Resp: (!) 22 (!) 22  19  Temp:      TempSrc:      SpO2: 100% 100% 100% 100%  Weight:      Height:        Constitutional: NAD, calm, comfortable Eyes: PERRL, lids and conjunctivae normal ENMT: Mucous membranes are moist. Posterior pharynx clear of any exudate or lesions.Normal dentition.  Neck: normal, supple, no masses, no thyromegaly Respiratory: clear to auscultation bilaterally, no wheezing, no crackles. Normal respiratory effort. No accessory muscle use.  Cardiovascular: Regular rate and rhythm, no murmurs / rubs / gallops. No extremity edema. 2+ pedal pulses. No carotid bruits.  Abdomen: no tenderness, no masses palpated. No hepatosplenomegaly. Bowel sounds positive.  Musculoskeletal: no clubbing / cyanosis. No joint deformity upper and lower extremities. Good ROM, no  contractures. Normal muscle tone.  Skin: no rashes, lesions, ulcers. No induration Neurologic: CN 2-12 grossly intact. Sensation intact, DTR normal. Strength 5/5 in all 4.  Psychiatric: Normal judgment and insight. Alert and oriented x 3. Normal mood.    Labs on Admission: I have personally reviewed following labs and imaging studies  CBC:  Recent Labs Lab 11/08/16 2353 11/09/16 0013  WBC 6.1  --   NEUTROABS 2.2  --   HGB 12.7 12.2  HCT 36.9 36.0  MCV 81.5  --   PLT 126*  --    Basic Metabolic  Panel:  Recent Labs Lab 11/08/16 2353 11/09/16 0013  NA 139 141  K 3.5 3.4*  CL 111 108  CO2 20*  --   GLUCOSE 135* 136*  BUN 9 8  CREATININE 0.44 0.40*  CALCIUM 8.6*  --   MG 1.5*  --    GFR: Estimated Creatinine Clearance: 77.6 mL/min (A) (by C-G formula based on SCr of 0.4 mg/dL (L)). Liver Function Tests:  Recent Labs Lab 11/08/16 2353  AST 29  ALT 30  ALKPHOS 223*  BILITOT 0.6  PROT 5.9*  ALBUMIN 3.3*   No results for input(s): LIPASE, AMYLASE in the last 168 hours. No results for input(s): AMMONIA in the last 168 hours. Coagulation Profile: No results for input(s): INR, PROTIME in the last 168 hours. Cardiac Enzymes: No results for input(s): CKTOTAL, CKMB, CKMBINDEX, TROPONINI in the last 168 hours. BNP (last 3 results) No results for input(s): PROBNP in the last 8760 hours. HbA1C: No results for input(s): HGBA1C in the last 72 hours. CBG: No results for input(s): GLUCAP in the last 168 hours. Lipid Profile: No results for input(s): CHOL, HDL, LDLCALC, TRIG, CHOLHDL, LDLDIRECT in the last 72 hours. Thyroid Function Tests:  Recent Labs  11/08/16 2353  TSH <0.010*  FREET4 >5.50*   Anemia Panel: No results for input(s): VITAMINB12, FOLATE, FERRITIN, TIBC, IRON, RETICCTPCT in the last 72 hours. Urine analysis:    Component Value Date/Time   COLORURINE YELLOW 11/09/2016 0001   APPEARANCEUR CLEAR 11/09/2016 0001   LABSPEC 1.006 11/09/2016 0001   PHURINE 6.0 11/09/2016 0001   GLUCOSEU NEGATIVE 11/09/2016 0001   HGBUR NEGATIVE 11/09/2016 0001   BILIRUBINUR NEGATIVE 11/09/2016 0001   KETONESUR NEGATIVE 11/09/2016 0001   PROTEINUR NEGATIVE 11/09/2016 0001   UROBILINOGEN 1.0 11/02/2012 1916   NITRITE NEGATIVE 11/09/2016 0001   LEUKOCYTESUR NEGATIVE 11/09/2016 0001    Radiological Exams on Admission: Dg Chest Port 1 View  Result Date: 11/08/2016 CLINICAL DATA:  Chest pain with shortness of breath EXAM: PORTABLE CHEST 1 VIEW COMPARISON:  08/29/2014  FINDINGS: Temporary pacer patch is obscure the chest. No acute infiltrate or effusion. Normal cardiomediastinal silhouette. No pneumothorax. IMPRESSION: No active disease. Electronically Signed   By: Jasmine Pang M.D.   On: 11/08/2016 23:45    EKG: Independently reviewed.  Assessment/Plan Principal Problem:   Thyrotoxicosis Active Problems:   Tachycardia   Graves disease    1. Thyrotoxicosis from graves disease - 1. Continue home max dose tapazole at 20mg  PO BID 2. Starting propanolol 10mg  Q6H PO, likely will need to increase 3. Call Endocrine in AM, patient probably really needs to be considered for RAI at this point since tapazole isnt working. 4. Social work consult due to no insurance.  DVT prophylaxis: Lovenox Code Status: Full Family Communication: Family at bedside Disposition Plan: Home after admit Consults called: None Admission status: Place in obs   Suad Autrey M. DO [facility]  Pager [phone]  If 7AM-7PM, please contact day team taking care of patient [email or web address] Password [facility]  11/09/2016, 2:53 AM

## 2016-11-09 NOTE — ED Notes (Signed)
repaged [facility]- [facility]

## 2016-11-09 NOTE — Care Management Note (Addendum)
Case Management Note  Patient Details  Name: Tracy Carson MRN: [id number] Date of Birth: 18-Apr-1981  Subjective/Objective:  Pt presented for Thyrotoxicosis-Hx of Graves Disease. Pt is without PCP and Insurance. Pt is from home with husband and small children. Pt is not working at this time. Interpreter [name] was called @ time of visit.                  Action/Plan: CM did reach out to the [employer] with TCC. Pt will not be appropriate for TCC- however Liaison is working to establish a scheduled appointment at the [facility]. Once patient has an appointment, pt will be able to utilize the pharmacy with medications ranging from $4.00-$10.00. Pt is aware that an appointment has been scheduled and that she will meet with the Financial Counselor at the Clinic for further assistance.  Expected Discharge Date:                  Expected Discharge Plan:  Home/Self Care  In-House Referral:  [name], Interpreting Services  Discharge planning Services  CM Consult, Follow-up appt scheduled, Indigent Health Clinic, Medication Assistance  Post Acute Care Choice:  NA Choice offered to:  NA  DME Arranged:  N/A DME Agency:  NA  HH Arranged:  NA HH Agency:  NA  Status of Service:  Completed, signed off  If discussed at Long Length of Stay Meetings, dates discussed:    Additional Comments: 1205 11-10-16 Tomi Bamberger, RN,BSN [phone] CM did speak with TCC Liaison and Tapazole/Propranolol can be picked up @ [facility]. CM did make MD aware and pt knows that with increased number of pills cost may be between $10.00-$30.00 for Tapazole. Per pt she has family support with medications. CM did call over to [facility] Endocrinologist and Dr. Lucianne Muss will accept patient under terms and pt is agreeable to terms that were stated. Appointment to be placed in EPIC for f/u. No further needs from CM at this time.    1218 11-09-16 Tomi Bamberger, RN,BSN [phone] CM did call  Endocrinologist Office at [facility] and a message has been sent to MD Lucianne Muss to see if will accept the patient. CM will call back to see if will be able to see. No further needs @ this time.    1155 11-09-16 Tomi Bamberger, RN,BSN [phone] CM did speak with pt in regards to missed appointments- (no call-no show) at [facility] and she stated she lost the number and was not able to call. Per Office staff they do have a Software engineer for payment due to office does not take Medicaid or [employer]. Pt states family assists with Tapazole cost at 149.00 at [facility]. Nedra Hai at [facility] would be $104.60. CM will continue to monitor for additional needs.  [name], RN 11/09/2016, 11:26 AM

## 2016-11-09 NOTE — Telephone Encounter (Signed)
Message received from Roselyn Bering- Mitzie Na, RN CM requesting a hospital follow up appointment for the patient as well as  an appointment with Financial Counseling to apply for the [facility]. The patient does not meet TCC criteria; but  was scheduled an appointment for hospital follow up on 11/15/16 @ 1000 and a financial counseling appointment the same day @ 1100 at [facility].  The information was placed on the AVS. Update provided to B. Elbert Ewings, RN CM

## 2016-11-09 NOTE — ED Notes (Signed)
propanolol received from main pharmacy given via hand off to ER Pharmacist.

## 2016-11-10 DIAGNOSIS — E0590 Thyrotoxicosis, unspecified without thyrotoxic crisis or storm: Secondary | ICD-10-CM

## 2016-11-10 DIAGNOSIS — R000 Tachycardia, unspecified: Secondary | ICD-10-CM

## 2016-11-10 LAB — CBC
HEMATOCRIT: 33.9 % — AB (ref 36.0–46.0)
HEMOGLOBIN: 11.5 g/dL — AB (ref 12.0–15.0)
MCH: 27.8 pg (ref 26.0–34.0)
MCHC: 33.9 g/dL (ref 30.0–36.0)
MCV: 82.1 fL (ref 78.0–100.0)
Platelets: 134 10*3/uL — ABNORMAL LOW (ref 150–400)
RBC: 4.13 MIL/uL (ref 3.87–5.11)
RDW: 13.2 % (ref 11.5–15.5)
WBC: 5.5 10*3/uL (ref 4.0–10.5)

## 2016-11-10 LAB — BASIC METABOLIC PANEL
Anion gap: 5 (ref 5–15)
BUN: 6 mg/dL (ref 6–20)
CHLORIDE: 110 mmol/L (ref 101–111)
CO2: 22 mmol/L (ref 22–32)
Calcium: 8.9 mg/dL (ref 8.9–10.3)
Creatinine, Ser: 0.38 mg/dL — ABNORMAL LOW (ref 0.44–1.00)
GFR calc Af Amer: >60 mL/min (ref >60)
GFR calc non Af Amer: >60 mL/min (ref >60)
GLUCOSE: 123 mg/dL — AB (ref 65–99)
POTASSIUM: 4 mmol/L (ref 3.5–5.1)
SODIUM: 137 mmol/L (ref 135–145)

## 2016-11-10 LAB — T3, FREE: T3, Free: 28.6 pg/mL — ABNORMAL HIGH (ref 2.0–4.4)

## 2016-11-10 MED ORDER — ACETAMINOPHEN 325 MG PO TABS
650.0000 mg | ORAL_TABLET | ORAL | Status: DC | PRN
  Administered 2016-11-10: 650 mg via ORAL
  Filled 2016-11-10: qty 2

## 2016-11-10 MED ORDER — PROPRANOLOL HCL 10 MG PO TABS: 10.0000 mg | ORAL_TABLET | Freq: Four times a day (QID) | ORAL | 1 refills | Status: DC

## 2016-11-10 MED ORDER — METHIMAZOLE 10 MG PO TABS
20.0000 mg | ORAL_TABLET | Freq: Two times a day (BID) | ORAL | 2 refills | Status: DC
Start: 2016-11-10 — End: 2016-11-15

## 2016-11-10 MED FILL — methIMAzole 10 MG TABS: 10 | 5 days supply | Qty: 20 | Fill #0

## 2016-11-10 MED FILL — PROPRANOLOL 10 MG TABLET: 10 | 30 days supply | Qty: 120 | Fill #0

## 2016-11-10 NOTE — Discharge Summary (Signed)
Physician Discharge Summary  Tracy Carson TDV:[id number] DOB: Feb 15, 1981 DOA: 11/08/2016  PCP: Default, Provider, MD  Admit date: 11/08/2016 Discharge date: 11/10/2016   Recommendations for Outpatient Follow-Up:   1. TSH, FT4, T3 with follow up at endocrine   Discharge Diagnosis:   Principal Problem:   Thyrotoxicosis Active Problems:   Tachycardia   Graves disease   Discharge disposition:  Home.  Discharge Condition: Improved.  Diet recommendation: Regular.  Wound care: None.   History of Present Illness:   Tracy Carson is a 36 y.o. female with medical history significant of Graves disease with severe hyperthyroidism.  Thyrotoxicosis despite treatment with tapazole.  Last saw Dr. Wyonia Carson back in 2015 secondary to no insurance.  Dr. Wyonia Carson seemed to suggest RAI or thyroidectomy being considered at that time but not an option due to patient lack of insurance.  She states she has been compliant with tapazole, missing at most 1-2 doses a week.  She states she is taking as directed (2 pills in AM, 2 pills at night).  She states that she doesn't feel like it is having any effect.  Patient presents to the ED with 1 week history of intermittent palpitations, feeling flushed, hot, sweaty.  Nothing makes symptoms better or worse.   Hospital Course by Problem:   Hyperthyroidism -patient admitted to not taking medication as prescribed as she was running out of pills -new prescription given -follow up arranged with PCP And new endocrinology -continue propanolol for now -discussed with patient that may ultimately need RAI and information given to patient in Spanish    Medical Consultants:    None.   Discharge Exam:   Vitals:   11/09/16 2314 11/10/16 0551  BP: (!) 117/53 (!) 114/42  Pulse: 92 91  Resp:    Temp: 98 F (36.7 C) 98.3 F (36.8 C)   Vitals:   11/09/16 0354 11/09/16 1407 11/09/16 2314 11/10/16 0551  BP: 126/71 (!) 133/57 (!) 117/53 (!)  114/42  Pulse: 87 95 92 91  Resp:  20    Temp: 98.3 F (36.8 C) 98.4 F (36.9 C) 98 F (36.7 C) 98.3 F (36.8 C)  TempSrc: Oral Oral Oral Oral  SpO2: 99%  100% 100%  Weight:    61.6 kg (135 lb 14.4 oz)  Height:        Gen:  NAD    The results of significant diagnostics from this hospitalization (including imaging, microbiology, ancillary and laboratory) are listed below for reference.     Procedures and Diagnostic Studies:   Dg Chest Port 1 View  Result Date: 11/08/2016 CLINICAL DATA:  Chest pain with shortness of breath EXAM: PORTABLE CHEST 1 VIEW COMPARISON:  08/29/2014 FINDINGS: Temporary pacer patch is obscure the chest. No acute infiltrate or effusion. Normal cardiomediastinal silhouette. No pneumothorax. IMPRESSION: No active disease. Electronically Signed   By: Jasmine Pang M.D.   On: 11/08/2016 23:45     Labs:   Basic Metabolic Panel:  Recent Labs Lab 11/08/16 2353 11/09/16 0013 11/10/16 0410  NA 139 141 137  K 3.5 3.4* 4.0  CL 111 108 110  CO2 20*  --  22  GLUCOSE 135* 136* 123*  BUN 9 8 6   CREATININE 0.44 0.40* 0.38*  CALCIUM 8.6*  --  8.9  MG 1.5*  --   --    GFR Estimated Creatinine Clearance: 82.6 mL/min (A) (by C-G formula based on SCr of 0.38 mg/dL (L)). Liver Function Tests:  Recent Labs Lab 11/08/16 2353  AST 29  ALT 30  ALKPHOS 223*  BILITOT 0.6  PROT 5.9*  ALBUMIN 3.3*   No results for input(s): LIPASE, AMYLASE in the last 168 hours. No results for input(s): AMMONIA in the last 168 hours. Coagulation profile No results for input(s): INR, PROTIME in the last 168 hours.  CBC:  Recent Labs Lab 11/08/16 2353 11/09/16 0013 11/10/16 0410  WBC 6.1  --  5.5  NEUTROABS 2.2  --   --   HGB 12.7 12.2 11.5*  HCT 36.9 36.0 33.9*  MCV 81.5  --  82.1  PLT 126*  --  134*   Cardiac Enzymes: No results for input(s): CKTOTAL, CKMB, CKMBINDEX, TROPONINI in the last 168 hours. BNP: Invalid input(s): POCBNP CBG: No results for  input(s): GLUCAP in the last 168 hours. D-Dimer  Recent Labs  11/08/16 2353  DDIMER 0.28   Hgb A1c No results for input(s): HGBA1C in the last 72 hours. Lipid Profile No results for input(s): CHOL, HDL, LDLCALC, TRIG, CHOLHDL, LDLDIRECT in the last 72 hours. Thyroid function studies  Recent Labs  11/08/16 2353  TSH <0.010*  T3FREE 28.6*   Anemia work up No results for input(s): VITAMINB12, FOLATE, FERRITIN, TIBC, IRON, RETICCTPCT in the last 72 hours. Microbiology No results found for this or any previous visit (from the past 240 hour(s)).   Discharge Instructions:   Discharge Instructions    Diet - low sodium heart healthy    Complete by:  As directed    Discharge instructions    Complete by:  As directed    VERY important to follow up with PCP and endocrinology and to take medications as prescribed   Increase activity slowly    Complete by:  As directed      Allergies as of 11/10/2016   No Known Allergies     Medication List    TAKE these medications   albuterol 108 (90 Base) MCG/ACT inhaler Commonly known as:  PROVENTIL HFA;VENTOLIN HFA Inhale 1-2 puffs into the lungs every 4 (four) hours as needed for wheezing or shortness of breath.   methimazole 10 MG tablet Commonly known as:  TAPAZOLE Take 2 tablets (20 mg total) by mouth 2 (two) times daily.   propranolol 10 MG tablet Commonly known as:  INDERAL Take 1 tablet (10 mg total) by mouth every 6 (six) hours.      Follow-up Information    [facility]. Go on 11/15/2016.   Why:  at 10:00am for a hospital follow up appointment with Dr Tracy Carson. Then at 11:00am you have an appointment with the Financial Counselor at the same clinic.  Contact information: [address] [place] [address] [phone]           Time coordinating discharge: 35 min  Signed:  JESSICA Juanetta Carson   [facility] 11/10/2016, 12:20 PM

## 2016-11-15 ENCOUNTER — Ambulatory Visit: Payer: Self-pay | Attending: Internal Medicine | Admitting: Internal Medicine

## 2016-11-15 ENCOUNTER — Ambulatory Visit: Payer: Self-pay

## 2016-11-15 ENCOUNTER — Encounter: Payer: Self-pay | Admitting: Internal Medicine

## 2016-11-15 VITALS — BP 139/54 | HR 80 | Temp 98.3°F | Resp 18 | Ht 62.0 in | Wt 137.0 lb

## 2016-11-15 DIAGNOSIS — Z1321 Encounter for screening for nutritional disorder: Secondary | ICD-10-CM

## 2016-11-15 DIAGNOSIS — E0500 Thyrotoxicosis with diffuse goiter without thyrotoxic crisis or storm: Secondary | ICD-10-CM | POA: Insufficient documentation

## 2016-11-15 DIAGNOSIS — I159 Secondary hypertension, unspecified: Secondary | ICD-10-CM

## 2016-11-15 DIAGNOSIS — Z13220 Encounter for screening for lipoid disorders: Secondary | ICD-10-CM

## 2016-11-15 DIAGNOSIS — D649 Anemia, unspecified: Secondary | ICD-10-CM | POA: Insufficient documentation

## 2016-11-15 DIAGNOSIS — E0580 Other thyrotoxicosis without thyrotoxic crisis or storm: Secondary | ICD-10-CM

## 2016-11-15 DIAGNOSIS — I10 Essential (primary) hypertension: Secondary | ICD-10-CM | POA: Insufficient documentation

## 2016-11-15 DIAGNOSIS — Z131 Encounter for screening for diabetes mellitus: Secondary | ICD-10-CM

## 2016-11-15 DIAGNOSIS — F17200 Nicotine dependence, unspecified, uncomplicated: Secondary | ICD-10-CM | POA: Insufficient documentation

## 2016-11-15 DIAGNOSIS — R062 Wheezing: Secondary | ICD-10-CM | POA: Insufficient documentation

## 2016-11-15 LAB — POCT GLYCOSYLATED HEMOGLOBIN (HGB A1C): HEMOGLOBIN A1C: 5.2

## 2016-11-15 MED ORDER — PROPRANOLOL HCL 10 MG PO TABS: 10.0000 mg | ORAL_TABLET | Freq: Four times a day (QID) | ORAL | 1 refills | Status: DC

## 2016-11-15 MED ORDER — METHIMAZOLE 10 MG PO TABS: 20.0000 mg | ORAL_TABLET | Freq: Two times a day (BID) | ORAL | 2 refills | Status: DC

## 2016-11-15 NOTE — Patient Instructions (Addendum)
Hipertiroidismo (Hyperthyroidism) El hipertiroidismo ocurre cuando la tiroides est demasiado activa (hiperactiva). La tiroides es una glndula grande ubicada en el cuello que ayuda a Chief Operating Officer la forma en que el organismo [place] los alimentos (metabolismo). Cuando la tiroides est hiperactiva, produce una cantidad excesiva de una hormona llamada tiroxina. CAUSAS Las causas del hipertiroidismo pueden incluir lo siguiente:  Enfermedad de Graves-[place]. Se produce cuando el sistema inmunitario ataca la tiroides. sta es la causa ms frecuente.  Inflamacin de la tiroides.  Tumor en la tiroides o Chief of Staff.  Uso excesivo de medicamentos para la tiroides, entre ellos:  Suplementos recetados para la tiroides.  Suplementos a base de hierbas que imitan a las hormonas tiroideas.  Bultos slidos o llenos de lquido en la tiroides (ndulos tiroideos).  Ingesta excesiva de yodo. FACTORES DE RIESGO  Ser mujer.  Tener antecedentes familiares de enfermedades tiroideas. SIGNOS Y SNTOMAS Los signos y los sntomas de hipertiroidismo pueden ser los siguientes:  Nerviosismo.  Incapacidad para Patent examiner.  Prdida de peso sin causa aparente.  Diarrea.  Cambios en la textura del pelo o la piel.  Falta de latidos cardacos o ms latidos cardacos.  Frecuencia cardaca acelerada.  Ausencia de la [employer].  Temblores en las manos.  Fatiga.  Agitacin.  Aumento del apetito.  Problemas para dormir.  Aumento del tamao de la tiroides o ndulos. DIAGNSTICO El diagnstico de hipertiroidismo puede incluir lo siguiente:  Examen fsico e historia clnica.  Anlisis de [place].  Ecografas. TRATAMIENTO El tratamiento puede incluir lo siguiente:  Medicamentos para Scientist, physiological tiroides.  Ciruga para extirpar la tiroides.  Radioterapia. INSTRUCCIONES PARA EL CUIDADO EN EL HOGAR  Tome los medicamentos solamente como se lo haya indicado el mdico.  No consuma  ningn producto que contenga tabaco, lo que incluye cigarrillos, tabaco de Theatre manager o Administrator, Civil Service. Si necesita ayuda para dejar de fumar, consulte al mdico.  No reanude la actividad fsica ni los ejercicios hasta que el mdico lo autorice.  Concurra a todas las visitas de control, segn le indique su mdico. Esto es importante. SOLICITE ATENCIN MDICA SI:  Los sntomas no mejoran con Scientist, research (medical).  Tiene fiebre.  Est tomando medicamentos sustitutivos de la tiroides y:  Est deprimido.  Se siente mental y fsicamente lento.  Aumenta de [place]. SOLICITE ATENCIN MDICA DE INMEDIATO SI:  Disminuy su estado de alerta o hay cambios en la conciencia.  Siente dolor abdominal.  Siente mareos.  Tiene una frecuencia cardaca acelerada.  Tiene latidos cardacos irregulares. Esta informacin no tiene Theme park manager el consejo del mdico. Asegrese de hacerle al mdico cualquier pregunta que tenga. Document Released: 07/12/2005 Document Revised: 08/02/2014 Document Reviewed: 11/27/2013 Elsevier Interactive Patient Education  2017 [employer].   -  Pasos para dejar de fumar (Steps to Quit Smoking) Fumar tabaco es malo para su salud. Puede afectar a casi cualquier rgano del cuerpo. Fumar lo pone a usted y a Magazine features editor a su alrededor en riesgo de Runner, broadcasting/film/video enfermedades graves de [place] plazo (crnicas). Dejar de fumar es difcil, pero es una de las mejores cosas que puede hacer por su salud. Nunca es muy tarde para dejar de fumar. CULES SON LOS BENEFICIOS QUE SE OBTIENEN AL DEJAR DE FUMAR? Al dejar de fumar, se reduce el riesgo de contraer enfermedades y afecciones graves. Estas pueden incluir los siguientes:  Enfermedad o cncer de pulmn.  Cardiopata coronaria.  Ictus.  Infarto de miocardio.  Imposibilidad de tener hijos (infertilidad).  Huesos dbiles (osteoporosis) y  huesos rotos (fracturas). La tos, las sibilancias y la falta de aire son  sntomas que mejoran cuando deja de fumar. Es posible tambin que se enferme con Scientist, research (physical sciences). Si est embarazada, dejar de fumar la ayudar a reducir las probabilidades de Warehouse manager un beb de bajo peso al nacer. QU PUEDO HACER PARA ABANDONAR EL HBITO DE FUMAR? Pregntele al [employer] cosas que pueden ayudarlo a dejar el hbito. Algunos cosas que puede hacer (estrategias) incluyen:  Dejar de fumar de forma definitiva en lugar de ir reduciendo gradualmente la cantidad de cigarrillos durante un perodo.  Recibir asesoramiento psicolgico individual. Es ms probable que tenga xito si asiste a diversas sesiones de Optometrist.  Usar recursos y sistemas de soporte, como, por ejemplo:  Charlas en lnea con un consejero.  Lneas telefnicas para dejar de fumar.  Materiales impresos de [place].  Grupos de apoyo o asesoramiento psicolgico grupal.  Programas de mensajes de texto.  Aplicaciones para telfonos celulares.  Tomar medicamentos. Algunos de estos medicamentos pueden contener nicotina. Si est embarazada o amamantando, no tome ningn medicamento para dejar de fumar, excepto que el mdico lo autorice. Hable con el mdico sobre el asesoramiento psicolgico o sobre otras cosas que [place]. Hable con el mdico sobre usar ms de una estrategia al [employer], [place], por ejemplo, tomar medicamentos y tambin recibir asesoramiento psicolgico. Esto puede facilitarle el proceso para dejar de fumar. QU PUEDO HACER PARA QUE DEJAR DE FUMAR SEA MS FCIL? Al principio, dejar de fumar puede parecer abrumador, pero hay muchas opciones que facilitan el [place]. Tome estas medidas:  Converse con su familia o sus amigos. Busque su apoyo y [place].  Llame a las lneas telefnicas que ayudan a dejar de fumar, pngase en contacto con grupos de apoyo o reciba asesoramiento de un consejero.  Pdale a la gente que fuma que no lo haga a su alrededor.  Evite los lugares  que pueden despertar el deseo de fumar (disparadores), como, por ejemplo:  Bares.  Fiestas.  reas para fumar en el trabajo.  Pase tiempo con personas que no fuman.  Disminuya todo tipo de estrs de la vida diaria. El estrs puede hacer que usted desee fumar. Pruebe estas cosas para disminuir el estrs:  Education administrator actividad fsica con regularidad.  Practicar ejercicios de respiracin profunda.  Practicar yoga.  Medite.  Realizar una visualizacin corporal. Para ello, cierre los ojos, concntrese en una zona del cuerpo a la vez desde la cabeza [employer] dedos de los pies y fjese qu partes del cuerpo estn tensas. Relaje los msculos de esas reas.  Descargue o compre aplicaciones para telfonos mviles o tabletas que le ayuden a respetar el plan para dejar de fumar. Hay muchas aplicaciones gratuitas, como QuitGuide de los Centros para el Control y la Prevencin de Event organiser (CDC, Marine scientist for [employer] and Prevention). Puede hallar otros recursos de [employer].gov y en otros sitios web. Esta informacin no tiene Theme park manager el consejo del mdico. Asegrese de hacerle al mdico cualquier pregunta que tenga. Document Released: 08/14/2010 Document Revised: 10/04/2011 Document Reviewed: 11/26/2014 Elsevier Interactive Patient Education  2017 [employer].

## 2016-11-15 NOTE — Progress Notes (Signed)
Tracy Carson, is a 36 y.o. female  EAV:[id number]  [id number]:[id number]  DOB - May 23, 1981  Chief Complaint  Patient presents with  . Hospitalization Follow-up        Subjective:   Tracy Carson is a 36 y.o. female here today for a follow up visit from recent hospitalization 11/08/16 - 11/10/16 for severe hyperthyroidism due to Graves disease. Of note, last saw endo Dr Wyonia Hough in 2015, and suggested RAI or thyroidectomy at time, but stopped seeing him due to no insurance.  Patient has No headache, No chest pain, No abdominal pain - No Nausea, No new weakness tingling or numbness, No Cough - SOB.  No problems updated.  ALLERGIES: No Known Allergies  PAST MEDICAL HISTORY: Past Medical History:  Diagnosis Date  . Hypertension   . Thyroid disease   . 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 Authorizing Provider  albuterol (PROVENTIL HFA;VENTOLIN HFA) 108 (90 BASE) MCG/ACT inhaler Inhale 1-2 puffs into the lungs every 4 (four) hours as needed for wheezing or shortness of breath. 08/29/14  Yes Marisa Severin, MD  methimazole (TAPAZOLE) 10 MG tablet Take 2 tablets (20 mg total) by mouth 2 (two) times daily. 11/15/16  Yes Pete Glatter, MD  propranolol (INDERAL) 10 MG tablet Take 1 tablet (10 mg total) by mouth every 6 (six) hours. 11/15/16  Yes Pete Glatter, MD     Objective:   Vitals:   11/15/16 1005  BP: (!) 139/54  Pulse: 80  Resp: 18  Temp: 98.3 F (36.8 C)  TempSrc: Oral  SpO2: 96%  Weight: 137 lb (62.1 kg)  Height: 5\' 2"  (1.575 m)    Exam General appearance : Awake, alert, not in any distress. Speech Clear. Not toxic looking, not toxic.  Appropriate. HEENT: Atraumatic and Normocephalic, pupils equally reactive to light. Neck: supple, no JVD. No cervical lymphadenopathy.   Diffuse goiter noted, no bruits bilat, nttp. Chest:Good air entry bilaterally, no added sounds. CVS: S1 S2  regular, no murmurs/gallups or rubs. Abdomen: Bowel sounds active, Non tender and not distended with no gaurding, rigidity or rebound. Extremities: B/L Lower Ext shows no edema, both legs are warm to touch Neurology: Awake alert, and oriented X 3, CN II-XII grossly intact, Non focal Skin:No Rash  Data Review No results found for: HGBA1C  Depression screen [facility] 2/9 11/15/2016 06/27/2014 05/16/2014  Decreased Interest 1 0 0  Down, Depressed, Hopeless 0 0 0  PHQ - 2 Score 1 0 0  Altered sleeping 1 - -  Tired, decreased energy 1 - -  Change in appetite 0 - -  Feeling bad or failure about yourself  0 - -  Trouble concentrating 0 - -  Moving slowly or fidgety/restless 0 - -  Suicidal thoughts 0 - -  PHQ-9 Score 3 - -      Assessment & Plan   1. Other thyrotoxicosis without thyrotoxic crisis or storm Taking methimazole and propranolol currently, recd continue - has appt w/ financial aid today, once get, refer to endo for further trx eval.  2. Graves disease See #1 - methimazole (TAPAZOLE) 10 MG tablet; Take 2 tablets (20 mg total) by mouth 2 (two) times daily.  Dispense: 120 tablet; Refill: 2 - T4, Free - TSH - T3, Free  3. htn - suspect due to severe thyrotoxicosis - not tachycardic currently - cont propranolol and methimazole for now.  4. Anemia, unspecified type - suspect due to Graves disease. - Ferritin - Iron - Iron and TIBC - CBC with Differential  5. Smoking Total cessation recd, tips provided  6. Language barrier Interpreter was used to communicate directly with patient for the entire encounter including providing detailed patient instructions.   7. Diabetes mellitus screening - HgB A1c  8. Encounter for vitamin deficiency screening - VITAMIN D 25 Hydroxy (Vit-D Deficiency, Fractures)  9. Lipid screening - Lipid Panel     Patient have been counseled extensively about nutrition and exercise  Return in about 6 weeks (around 12/27/2016) for  HYPERTHYROIDISM.  The patient was given clear instructions to go to ER or return to medical center if symptoms don't improve, worsen or new problems develop. The patient verbalized understanding. The patient was told to call to get lab results if they haven't heard anything in the next week.   This note has been created with Education officer, environmental. Any transcriptional errors are unintentional.   Pete Glatter, MD, MBA/MHA [facility] and [facility] [place], [place] [phone]   11/15/2016, 10:30 AM

## 2016-11-15 NOTE — Progress Notes (Signed)
Patient is here for HFU  Patient denies pain at this time.  Patient has taken medication today. Patient has not eaten today.  

## 2016-11-16 ENCOUNTER — Other Ambulatory Visit: Payer: Self-pay | Admitting: Internal Medicine

## 2016-11-16 LAB — CBC WITH DIFFERENTIAL/PLATELET
BASOS ABS: 0 10*3/uL (ref 0.0–0.2)
BASOS: 0 %
EOS (ABSOLUTE): 0.2 10*3/uL (ref 0.0–0.4)
Eos: 2 %
Hematocrit: 38.1 % (ref 34.0–46.6)
Hemoglobin: 13.3 g/dL (ref 11.1–15.9)
Immature Grans (Abs): 0 10*3/uL (ref 0.0–0.1)
Immature Granulocytes: 0 %
LYMPHS ABS: 2.7 10*3/uL (ref 0.7–3.1)
LYMPHS: 36 %
MCH: 28.4 pg (ref 26.6–33.0)
MCHC: 34.9 g/dL (ref 31.5–35.7)
MCV: 81 fL (ref 79–97)
MONOS ABS: 0.5 10*3/uL (ref 0.1–0.9)
Monocytes: 7 %
NEUTROS ABS: 4 10*3/uL (ref 1.4–7.0)
Neutrophils: 55 %
PLATELETS: 185 10*3/uL (ref 150–379)
RBC: 4.68 x10E6/uL (ref 3.77–5.28)
RDW: 13.5 % (ref 12.3–15.4)
WBC: 7.4 10*3/uL (ref 3.4–10.8)

## 2016-11-16 LAB — LIPID PANEL
CHOL/HDL RATIO: 4.7 ratio — AB (ref 0.0–4.4)
Cholesterol, Total: 112 mg/dL (ref 100–199)
HDL: 24 mg/dL — AB (ref >39)
LDL Calculated: 56 mg/dL (ref 0–99)
Triglycerides: 162 mg/dL — ABNORMAL HIGH (ref 0–149)
VLDL Cholesterol Cal: 32 mg/dL (ref 5–40)

## 2016-11-16 LAB — IRON AND TIBC
IRON SATURATION: 44 % (ref 15–55)
IRON: 121 ug/dL (ref 27–159)
TIBC: 272 ug/dL (ref 250–450)
UIBC: 151 ug/dL (ref 131–425)

## 2016-11-16 LAB — VITAMIN D 25 HYDROXY (VIT D DEFICIENCY, FRACTURES): Vit D, 25-Hydroxy: 18.1 ng/mL — ABNORMAL LOW (ref 30.0–100.0)

## 2016-11-16 LAB — TSH: TSH: <0.006 u[IU]/mL — ABNORMAL LOW (ref 0.450–4.500)

## 2016-11-16 LAB — FERRITIN: FERRITIN: 262 ng/mL — AB (ref 15–150)

## 2016-11-16 LAB — T4, FREE: Free T4: 6.71 ng/dL — ABNORMAL HIGH (ref 0.82–1.77)

## 2016-11-16 LAB — T3, FREE: T3 FREE: 20.8 pg/mL — AB (ref 2.0–4.4)

## 2016-11-16 MED ORDER — VITAMIN D (ERGOCALCIFEROL) 1.25 MG (50000 UNIT) PO CAPS: 50000.0000 [IU] | ORAL_CAPSULE | ORAL | 0 refills | Status: DC

## 2016-11-18 ENCOUNTER — Other Ambulatory Visit: Payer: Self-pay | Admitting: Internal Medicine

## 2016-11-18 ENCOUNTER — Telehealth: Payer: Self-pay

## 2016-11-18 DIAGNOSIS — S025XXA Fracture of tooth (traumatic), initial encounter for closed fracture: Secondary | ICD-10-CM

## 2016-11-18 NOTE — Telephone Encounter (Signed)
Pt. Called stating that three of her teeth are broken and she is in pain. Pt. Request to be referred to the dentist. Please f/u.

## 2016-11-18 NOTE — Telephone Encounter (Signed)
I placed referral to dentist. Does she have financial aid? thanks

## 2016-11-18 NOTE — Telephone Encounter (Signed)
Will forward to last provider who seen her

## 2016-12-01 ENCOUNTER — Encounter: Payer: Self-pay | Admitting: Endocrinology

## 2016-12-01 ENCOUNTER — Ambulatory Visit (INDEPENDENT_AMBULATORY_CARE_PROVIDER_SITE_OTHER): Payer: Self-pay | Admitting: Endocrinology

## 2016-12-01 VITALS — BP 138/80 | HR 75 | Ht 62.0 in | Wt 140.0 lb

## 2016-12-01 DIAGNOSIS — E0500 Thyrotoxicosis with diffuse goiter without thyrotoxic crisis or storm: Secondary | ICD-10-CM

## 2016-12-01 LAB — T4, FREE: FREE T4: 3.04 ng/dL — AB (ref 0.60–1.60)

## 2016-12-01 NOTE — Patient Instructions (Signed)
Take 2 Propranolol 2 times daily  Take Methimazole 3 tabs 2x daily, will call you to confirm the dose when the test result is available  For the radioactive iodine treatment you will first go for a test dose which takes 2 days Stop the methimazole at least 5 days before this appointment..  After this you will be scheduled for the radioactive and treatment   You will restart the methimazole 3 days after you get the actual treatment  Tome 2 Propranolol 2 veces al da  Tome 3 tabletas de Metimezol 2 veces al da, lo llamar para confirmar la dosis cuando el resultado de la prueba est disponible  Para el tratamiento con yodo radiactivo, primero deber tomar una dosis de prueba que demorar 2 das. Detenga el metimazol al menos 5 das antes de esta cita.  Despus de esto, se te programar para la radioactividad y Software engineerel tratamiento  Reiniciar el metimazol 3 das despus de recibir el tratamiento real

## 2016-12-01 NOTE — Progress Notes (Addendum)
Patient ID: Tracy Carson, female   DOB: 1981/03/28, 36 y.o.   MRN: [id number]                                                                                                               Reason for Appointment:  Hyperthyroidism, new consultation  Referring physician: Hospitalist   Chief complaint: Overactive thyroid   History of Present Illness:   Her hyperthyroidism was diagnosed in 2012 before a pregnancy At that time she had symptoms of palpitations, shakiness, feeling excessively warm, difficulty sleeping, increased appetite and hair loss She was treated with methimazole However subsequently was irregular with taking her methimazole She was recommended I-131 treatment in 2014 but even though she had an uptake test done she did not go for the treatment  Subsequently has been very irregular with her follow-up program and medications She says she was taking her methimazole 2 tablets twice a day every third day or so because of cost She also did not follow-up with her endocrinologist  Because of worsening symptoms of palpitations, feeling excessively hot and losing weight she presented to the emergency room on 11/08/16 and was admitted for 2 days Her usual weight is about 149 pounds  She has started feeling better since she went back to her medication and is taking 20 mg twice a day, has been taking this regularly since her discharge about 3 weeks ago Also taking propranolol 4 times a day with usually good control of palpitations She does feel a little tired and weak and is still concerned about her hair loss and feeling hot and sometimes shaky Also complaining of periodic anxiety and panic  Wt Readings from Last 3 Encounters:  12/01/16 140 lb (63.5 kg)  11/15/16 137 lb (62.1 kg)  11/10/16 135 lb 14.4 oz (61.6 kg)    Treatments so LFY:BOFBPZWCHEN 2 tablets twice a day  Thyroid function tests as follows:     Lab Results  Component Value Date   FREET4 6.71 (H)  11/15/2016   FREET4 >5.50 (H) 11/08/2016   FREET4 5.92 Repeated and verified X2. (H) 03/18/2016   T3FREE 20.8 (HH) 11/15/2016   T3FREE 28.6 (H) 11/08/2016   T3FREE >30.0 Repeated and verified X2. (H) 03/18/2016   TSH <0.006 (L) 11/15/2016   TSH <0.010 (L) 11/08/2016   TSH 0.01 Repeated and verified X2. (L) 03/18/2016    No results found for: THYROTRECAB   Allergies as of 12/01/2016   No Known Allergies     Medication List       Accurate as of 12/01/16 11:48 AM. Always use your most recent med list.          albuterol 108 (90 Base) MCG/ACT inhaler Commonly known as:  PROVENTIL HFA;VENTOLIN HFA Inhale 1-2 puffs into the lungs every 4 (four) hours as needed for wheezing or shortness of breath.   methimazole 10 MG tablet Commonly known as:  TAPAZOLE Take 2 tablets (20 mg total) by mouth 2 (two) times daily.   propranolol 10 MG tablet Commonly known  as:  INDERAL Take 1 tablet (10 mg total) by mouth every 6 (six) hours.   Vitamin D (Ergocalciferol) 50000 units Caps capsule Commonly known as:  DRISDOL Take 1 capsule (50,000 Units total) by mouth every 7 (seven) days.           Past Medical History:  Diagnosis Date  . Hypertension   . Thyroid disease   . Tuberculosis    as a child    Past Surgical History:  Procedure Laterality Date  . CESAREAN SECTION  07/02/2011   Procedure: CESAREAN SECTION;  Surgeon: Roseanna Rainbow, MD;  Location: [facility];  Service: Gynecology;  Laterality: N/A;  . CHOLECYSTECTOMY      Family History  Problem Relation Age of Onset  . Diabetes Mother   . Thyroid disease Paternal Grandmother     Social History:  reports that she has been smoking Cigarettes.  She has a 0.25 pack-year smoking history. She uses smokeless tobacco. She reports that she does not drink alcohol or use drugs.  Allergies: No Known Allergies   Review of Systems  Constitutional: Positive for weight loss.  HENT:       Sometimes as choking on swallowing  Eyes:  Positive for visual disturbance.       Only occasionally will see double, otherwise has a feeling of film over the eyes and some tearing, using allergy drops  Cardiovascular: Positive for leg swelling. Negative for palpitations.  Gastrointestinal: Negative for diarrhea.  Endocrine: Positive for heat intolerance. Negative for menstrual changes.  Musculoskeletal: Negative for muscle cramps.  Allergic/Immunologic:       Hair loss  Neurological: Positive for weakness and tremors.  Psychiatric/Behavioral: Positive for nervousness.       Examination:   BP 138/80   Pulse 75   Ht 5\' 2"  (1.575 m)   Wt 140 lb (63.5 kg)   SpO2 98%   BMI 25.61 kg/m    General Appearance:  well-built and nourished, pleasant, not anxious or hyperkinetic.        Eyes: Has mild prominence with exophthalmometry measuring about 20 mm bilaterally, slightly more on the right No lid lag or stare present. No swelling of the eyelids or chemosis   Neck: The thyroid is enlarged 3-3 . 5 times normal, smooth, non-tender and diffuse, relatively more on the right.  No thrill or bruit present There is no lymphadenopathy in the neck .           Heart: normal S1 and S2, no murmurs .          Lungs: breath sounds are clear bilaterally Abdomen: no hepatosplenomegaly or other palpable abnormality  Extremities: hands are warm. No ankle edema.  Neurological:  No tremors are present. Deep tendon reflexes at biceps are brisk.  Skin: No rash, abnormal thickening of the skin on the lower legs seen     Assessment/Plan:   Hyperthyroidism, long-standing Graves' disease   She has had persistent hyperthyroidism for 6 years now She needs to affective treatment Although she is generally agreeable to I-131 treatment she is concerned about being able to take care of her children after the treatment as she does not have any family where she can leave her daughter However discussed that surgery would also be involving her being in the  hospital couple of days and not being in contact with her daughter consistently Also discussed potential risks of surgery  Patient finally agrees to do that I-131 treatment but she wants to wait till school  is closed for summer  Meanwhile since she is still appears to be symptomatic from hypothyroidism will increase her Tapazole to 6 tablets a day and she can take extra propranolol as needed for palpitations or shakiness Free T4 to be rechecked today  Patient handout on  radioactive iodine treatment given Patient understands the above discussion and treatment options. All questions were answered satisfactorily  Patient Instructions  Take 2 Propranolol 2 times daily  Take Methimazole 3 tabs 2x daily, will call you to confirm the dose when the test result is available  For the radioactive iodine treatment you will first go for a test dose which takes 2 days Stop the methimazole at least 5 days before this appointment..  After this you will be scheduled for the radioactive and treatment   You will restart the methimazole 3 days after you get the actual treatment  Tome 2 Propranolol 2 veces al da  Tome 3 tabletas de Metimezol 2 veces al da, lo llamar para confirmar la dosis cuando el resultado de la prueba est disponible  Para el tratamiento con yodo radiactivo, primero deber tomar una dosis de prueba que demorar 2 das. Detenga el metimazol al menos 5 das antes de esta cita.  Despus de esto, se te programar para la radioactividad y el tratamiento  Reiniciar el metimazol 3 das despus de recibir el tratamiento real     [facility] 12/01/2016, 11:48 AM    Note: This office note was prepared with Insurance underwriter. Any transcriptional errors that result from this process are unintentional.

## 2016-12-03 NOTE — Progress Notes (Signed)
Please call to let patient know that the thyroid level is still high, increase methimazole to 6 tablets a day as discussed.  Also needs to schedule follow-up appointment in about 4 weeks to repeat labs and see me back for follow-up Patient will need Spanish interpreter

## 2016-12-29 ENCOUNTER — Encounter: Payer: Self-pay | Admitting: Family Medicine

## 2016-12-29 ENCOUNTER — Ambulatory Visit: Payer: Self-pay | Attending: Family Medicine | Admitting: Family Medicine

## 2016-12-29 VITALS — BP 130/73 | HR 77 | Temp 98.5°F | Wt 150.6 lb

## 2016-12-29 DIAGNOSIS — M62838 Other muscle spasm: Secondary | ICD-10-CM

## 2016-12-29 DIAGNOSIS — E0500 Thyrotoxicosis with diffuse goiter without thyrotoxic crisis or storm: Secondary | ICD-10-CM

## 2016-12-29 DIAGNOSIS — I10 Essential (primary) hypertension: Secondary | ICD-10-CM | POA: Insufficient documentation

## 2016-12-29 DIAGNOSIS — R635 Abnormal weight gain: Secondary | ICD-10-CM

## 2016-12-29 DIAGNOSIS — Z9049 Acquired absence of other specified parts of digestive tract: Secondary | ICD-10-CM | POA: Insufficient documentation

## 2016-12-29 DIAGNOSIS — Z79899 Other long term (current) drug therapy: Secondary | ICD-10-CM | POA: Insufficient documentation

## 2016-12-29 DIAGNOSIS — H578 Other specified disorders of eye and adnexa: Secondary | ICD-10-CM | POA: Insufficient documentation

## 2016-12-29 DIAGNOSIS — L659 Nonscarring hair loss, unspecified: Secondary | ICD-10-CM

## 2016-12-29 DIAGNOSIS — H5789 Other specified disorders of eye and adnexa: Secondary | ICD-10-CM

## 2016-12-29 MED ORDER — OLOPATADINE HCL 0.1 % OP SOLN: 1.0000 [drp] | Freq: Two times a day (BID) | OPHTHALMIC | 3 refills | Status: DC

## 2016-12-29 NOTE — Progress Notes (Signed)
Subjective:  Patient ID: Tracy Carson, female    DOB: September 04, 1980  Age: 36 y.o. MRN: [id number]  CC: Hyperthyroidism   HPI Isaiah Laporta is a 36 year old female with a history of Graves' disease who was lost to endocrine follow-up due to lack of medical coverage. She was previously followed by Dr Julien Nordmann and comes into the clinic to establish care with me.  She endorses compliance with all her medications but is concerned about 15 pound weight gain over the last 2 month and alopecia. She has also noticed chronic tearing of both eyes but denies diminished vision. States that other times discharge have seemed like whitish threads from her lower eyelid.  She has an upcoming appointment with endocrine to discuss possible radioactive iodine treatment.  Does have intermittent muscle spasms in her legs and hands which she has noticed over the last 2 weeks  Past Medical History:  Diagnosis Date  . Hypertension   . Thyroid disease   . Tuberculosis    as a child    Past Surgical History:  Procedure Laterality Date  . CESAREAN SECTION  07/02/2011   Procedure: CESAREAN SECTION;  Surgeon: Roseanna Rainbow, MD;  Location: [facility];  Service: Gynecology;  Laterality: N/A;  . CHOLECYSTECTOMY      No Known Allergies   Outpatient Medications Prior to Visit  Medication Sig Dispense Refill  . methimazole (TAPAZOLE) 10 MG tablet Take 2 tablets (20 mg total) by mouth 2 (two) times daily. 120 tablet 2  . propranolol (INDERAL) 10 MG tablet Take 1 tablet (10 mg total) by mouth every 6 (six) hours. 120 tablet 1  . Vitamin D, Ergocalciferol, (DRISDOL) 50000 units CAPS capsule Take 1 capsule (50,000 Units total) by mouth every 7 (seven) days. 12 capsule 0  . albuterol (PROVENTIL HFA;VENTOLIN HFA) 108 (90 BASE) MCG/ACT inhaler Inhale 1-2 puffs into the lungs every 4 (four) hours as needed for wheezing or shortness of breath. (Patient not taking: Reported on 12/01/2016) 1 Inhaler 0   No  facility-administered medications prior to visit.     ROS Review of Systems  Constitutional: Positive for unexpected weight change. Negative for activity change, appetite change and fatigue.  HENT: Negative for congestion, sinus pressure and sore throat.   Eyes: Positive for discharge. Negative for visual disturbance.  Respiratory: Negative for cough, chest tightness, shortness of breath and wheezing.   Cardiovascular: Negative for chest pain and palpitations.  Gastrointestinal: Negative for abdominal distention, abdominal pain and constipation.  Endocrine: Negative for polydipsia.  Genitourinary: Negative for dysuria and frequency.  Musculoskeletal: Positive for myalgias. Negative for arthralgias and back pain.  Skin: Negative for rash.  Neurological: Negative for tremors, light-headedness and numbness.  Hematological: Does not bruise/bleed easily.  Psychiatric/Behavioral: Negative for agitation and behavioral problems.    Objective:  BP 130/73   Pulse 77   Temp 98.5 F (36.9 C) (Oral)   Wt 150 lb 9.6 oz (68.3 kg)   SpO2 98%   BMI 27.55 kg/m   BP/Weight 12/29/2016 12/01/2016 11/15/2016  Systolic BP 130 138 139  Diastolic BP 73 80 54  Wt. (Lbs) 150.6 140 137  BMI 27.55 25.61 25.06      Physical Exam  Constitutional: She is oriented to person, place, and time. She appears well-developed and well-nourished.  Eyes: Right eye exhibits no discharge. Left eye exhibits no discharge.  Bilateral exophthalmus  Neck: Thyromegaly present.  Cardiovascular: Normal rate, normal heart sounds and intact distal pulses.   No murmur heard.  Pulmonary/Chest: Effort normal and breath sounds normal. She has no wheezes. She has no rales. She exhibits no tenderness.  Abdominal: Soft. Bowel sounds are normal. She exhibits no distension and no mass. There is no tenderness.  Musculoskeletal: Normal range of motion.  Neurological: She is alert and oriented to person, place, and time.  Skin: Skin is  warm and dry.  Psychiatric: She has a normal mood and affect.    Lab Results  Component Value Date   TSH <0.006 (L) 11/15/2016    Assessment & Plan:   1. Graves disease Continue current regimen Keep appointment with endocrine  2. Eye discharge She will need to be evaluated by ophthalmology for ophthalmic manifestations of Graves' disease - olopatadine (PATANOL) 0.1 % ophthalmic solution; Place 1 drop into both eyes 2 (two) times daily.  Dispense: 5 mL; Refill: 3  3. Alopecia Likely secondary to thyroid disease  4. Weight gain Explained to her that previously with untreated Graves' disease she could have been losing weight and now compensating. Too soon to recheck thyroid labs - will need some next month  5. Muscle spasm Advised to stay hydrated Refuses initiation of muscle relaxants   Meds ordered this encounter  Medications  . olopatadine (PATANOL) 0.1 % ophthalmic solution    Sig: Place 1 drop into both eyes 2 (two) times daily.    Dispense:  5 mL    Refill:  3    Follow-up: Return in about 3 months (around 03/31/2017) for coordination of care.   This note has been created with Education officer, environmental. Any transcriptional errors are unintentional.     Jaclyn Shaggy MD

## 2016-12-29 NOTE — Patient Instructions (Signed)
Hyperthyroidism Hyperthyroidism is when the thyroid is too active (overactive). Your thyroid is a large gland that is located in your neck. The thyroid helps to control how your body uses food (metabolism). When your thyroid is overactive, it produces too much of a hormone called thyroxine. What are the causes? Causes of hyperthyroidism may include:  Graves disease. This is when your immune system attacks the thyroid gland. This is the most common cause.  Inflammation of the thyroid gland.  Tumor in the thyroid gland or somewhere else.  Excessive use of thyroid medicines, including: ? Prescription thyroid supplement. ? Herbal supplements that mimic thyroid hormones.  Solid or fluid-filled lumps within your thyroid gland (thyroid nodules).  Excessive ingestion of iodine.  What increases the risk?  Being female.  Having a family history of thyroid conditions. What are the signs or symptoms? Signs and symptoms of hyperthyroidism may include:  Nervousness.  Inability to tolerate heat.  Unexplained weight loss.  Diarrhea.  Change in the texture of hair or skin.  Heart skipping beats or making extra beats.  Rapid heart rate.  Loss of menstruation.  Shaky hands.  Fatigue.  Restlessness.  Increased appetite.  Sleep problems.  Enlarged thyroid gland or nodules.  How is this diagnosed? Diagnosis of hyperthyroidism may include:  Medical history and physical exam.  Blood tests.  Ultrasound tests.  How is this treated? Treatment may include:  Medicines to control your thyroid.  Surgery to remove your thyroid.  Radiation therapy.  Follow these instructions at home:  Take medicines only as directed by your health care provider.  Do not use any tobacco products, including cigarettes, chewing tobacco, or electronic cigarettes. If you need help quitting, ask your health care provider.  Do not exercise or do physical activity until your health care provider  approves.  Keep all follow-up appointments as directed by your health care provider. This is important. Contact a health care provider if:  Your symptoms do not get better with treatment.  You have fever.  You are taking thyroid replacement medicine and you: ? Have depression. ? Feel mentally and physically slow. ? Have weight gain. Get help right away if:  You have decreased alertness or a change in your awareness.  You have abdominal pain.  You feel dizzy.  You have a rapid heartbeat.  You have an irregular heartbeat. This information is not intended to replace advice given to you by your health care provider. Make sure you discuss any questions you have with your health care provider. Document Released: 07/12/2005 Document Revised: 12/11/2015 Document Reviewed: 11/27/2013 Elsevier Interactive Patient Education  2017 [employer]  

## 2017-01-03 ENCOUNTER — Ambulatory Visit: Payer: Self-pay

## 2017-01-05 ENCOUNTER — Ambulatory Visit: Payer: Self-pay | Attending: Family Medicine

## 2017-01-11 ENCOUNTER — Other Ambulatory Visit (INDEPENDENT_AMBULATORY_CARE_PROVIDER_SITE_OTHER): Payer: Self-pay

## 2017-01-11 DIAGNOSIS — E0500 Thyrotoxicosis with diffuse goiter without thyrotoxic crisis or storm: Secondary | ICD-10-CM

## 2017-01-11 LAB — T4, FREE: FREE T4: 0.51 ng/dL — AB (ref 0.60–1.60)

## 2017-01-17 NOTE — Progress Notes (Signed)
Patient ID: Tracy Carson, female   DOB: Aug 10, 1980, 36 y.o.   MRN: [id number]                                                                                                               Reason for Appointment:  Hyperthyroidism, follow-up visit  Referring physician: Hospitalist   Chief complaint: Overactive thyroid   History of Present Illness:   Her hyperthyroidism was diagnosed in 2012 before a pregnancy At that time she had symptoms of palpitations, shakiness, feeling excessively warm, difficulty sleeping, increased appetite and hair loss She was treated with methimazole She was recommended I-131 treatment in 2014 but even though she had an uptake test done she did not go for the treatment Subsequently has been very irregular with her follow-up program and medications She says she was taking her methimazole 2 tablets twice a day every third day or so because of cost  Because of worsening symptoms of palpitations, feeling excessively hot and losing weight she presented to the emergency room on 11/08/16 and was admitted for 2 days Her usual weight is about 149 pounds  Recent history: She has started feeling better since after her hospitalization when she went back to  taking 20 mg methimazole twice a day However on her initial consultation in her free T4 was still very high at 3.04 and she was having problems with fatigue, weakness and heat intolerance along with tachycardia  Her methimazole was increased to 3 tablets twice a day She feels better with her energy level and has gained back some weight Only occasionally will feel hot She is asking about some cramping in her muscles especially the neck area   Wt Readings from Last 3 Encounters:  01/18/17 154 lb (69.9 kg)  12/29/16 150 lb 9.6 oz (68.3 kg)  12/01/16 140 lb (63.5 kg)     Thyroid function tests as follows:     Lab Results  Component Value Date   FREET4 0.51 (L) 01/11/2017   FREET4 3.04 (H) 12/01/2016   FREET4 6.71 (H) 11/15/2016   T3FREE 20.8 (HH) 11/15/2016   T3FREE 28.6 (H) 11/08/2016   T3FREE >30.0 Repeated and verified X2. (H) 03/18/2016   TSH <0.006 (L) 11/15/2016   TSH <0.010 (L) 11/08/2016   TSH 0.01 Repeated and verified X2. (L) 03/18/2016    No results found for: THYROTRECAB   Allergies as of 01/18/2017   No Known Allergies     Medication List       Accurate as of 01/18/17  1:51 PM. Always use your most recent med list.          albuterol 108 (90 Base) MCG/ACT inhaler Commonly known as:  PROVENTIL HFA;VENTOLIN HFA Inhale 1-2 puffs into the lungs every 4 (four) hours as needed for wheezing or shortness of breath.   methimazole 10 MG tablet Commonly known as:  TAPAZOLE Take 2 tablets (20 mg total) by mouth 2 (two) times daily.   olopatadine 0.1 % ophthalmic solution Commonly known as:  PATANOL Place 1 drop  into both eyes 2 (two) times daily.   propranolol 10 MG tablet Commonly known as:  INDERAL Take 1 tablet (10 mg total) by mouth every 6 (six) hours.   Vitamin D (Ergocalciferol) 50000 units Caps capsule Commonly known as:  DRISDOL Take 1 capsule (50,000 Units total) by mouth every 7 (seven) days.           Past Medical History:  Diagnosis Date  . Hypertension   . Thyroid disease   . Tuberculosis    as a child    Past Surgical History:  Procedure Laterality Date  . CESAREAN SECTION  07/02/2011   Procedure: CESAREAN SECTION;  Surgeon: Roseanna Rainbow, MD;  Location: [facility];  Service: Gynecology;  Laterality: N/A;  . CHOLECYSTECTOMY      Family History  Problem Relation Age of Onset  . Diabetes Mother   . Thyroid disease Paternal Grandmother   . Thyroid disease Sister   . Thyroid disease Brother     Social History:  reports that she has been smoking Cigarettes.  She has a 0.25 pack-year smoking history. She uses smokeless tobacco. She reports that she does not drink alcohol or use drugs.  Allergies: No Known Allergies   Review of  Systems     Examination:   BP 130/78   Pulse 72   Ht 5\' 2"  (1.575 m)   Wt 154 lb (69.9 kg)   SpO2 98%   BMI 28.17 kg/m    Eyes: Has mild prominence     Neck: The thyroid is enlarged at least 3 times normal, smooth, non-tender and diffuse, relatively more on the right.    No thrill or bruit present . Deep tendon reflexes at biceps are normal   Assessment/Plan:   Hyperthyroidism, secondary to long-standing Graves' disease   She has had persistent hyperthyroidism for 6 years now She has responded to antithyroid drugs with taking 60 mg a methimazole now with significant reduction of her thyroid levels since her last visit Also likely having better affect now with taking her methimazole regularly compared to previously  Although she is not showing any signs or symptoms of hypothyroidism she does have a mildly decreased free T4 now  She is now saying that I-131 treatment is acceptable and is ready to schedule this Previous uptake in 2014 was about 78%   She will stop her methimazole before the I-131 uptake and restart after the treatment with 3 tablets daily Meanwhile she can cut back methimazole down to 2 tablets twice a day again  Patient Instructions   Methimazole: Now start taking only 2 tablets twice a day  I will be ordering the radioactive iodine uptake test at the hospital nuclear medicine Department  5 days before you are scheduled to do the radioactive iodine test dose you will stop the methimazole  2 days after you have the actual treatment which is the third visit you will restart the methimazole and take 2 tablets in the morning and 1 in the evening  You will be needing to see me about 4 weeks after the radioactive and treatment with labs  Metimazol: ahora comienza a tomar solo 2 tabletas [employer] al da  Ordenar la prueba de captacin de yodo radiactivo en el departamento de medicina nuclear del hospital  5 das antes de la fecha programada para la  dosis de la prueba de yodo radiactivo, suspender el metimazol  2 das despus de que tenga el tratamiento real, que es la tercera visita, Games developer  el metimazol y tomar 2 tabletas por la maana y 1 por la noche  Necesitar verme aproximadamente 4 semanas despus de la radioactividad y el tratamiento con laboratorios       Aquilla Shambley 01/18/2017, 1:51 PM    Note: This office note was prepared with Insurance underwriter. Any transcriptional errors that result from this process are unintentional.

## 2017-01-18 ENCOUNTER — Ambulatory Visit (INDEPENDENT_AMBULATORY_CARE_PROVIDER_SITE_OTHER): Payer: Self-pay | Admitting: Endocrinology

## 2017-01-18 ENCOUNTER — Encounter: Payer: Self-pay | Admitting: Endocrinology

## 2017-01-18 VITALS — BP 130/78 | HR 72 | Ht 62.0 in | Wt 154.0 lb

## 2017-01-18 DIAGNOSIS — E0590 Thyrotoxicosis, unspecified without thyrotoxic crisis or storm: Secondary | ICD-10-CM

## 2017-01-18 NOTE — Patient Instructions (Addendum)
  Methimazole: Now start taking only 2 tablets twice a day  I will be ordering the radioactive iodine uptake test at the hospital nuclear medicine Department  5 days before you are scheduled to do the radioactive iodine test dose you will stop the methimazole  2 days after you have the actual treatment which is the third visit you will restart the methimazole and take 2 tablets in the morning and 1 in the evening  You will be needing to see me about 4 weeks after the radioactive and treatment with labs  Metimazol: ahora comienza a tomar solo 2 tabletas [employer] veces al da  Ordenar la prueba de captacin de yodo radiactivo en el departamento de medicina nuclear del hospital  5 das antes de la fecha programada para la dosis de la prueba de yodo radiactivo, suspender el metimazol  2 das despus de que tenga el tratamiento real, que es la tercera visita, reiniciar el metimazol y tomar 2 tabletas por la maana y 1 por la noche  Pension scheme managerecesitar verme aproximadamente 4 semanas despus de la radioactividad y Scientist, research (medical)el tratamiento con laboratorios

## 2017-02-03 ENCOUNTER — Encounter (HOSPITAL_COMMUNITY)
Admission: RE | Admit: 2017-02-03 | Discharge: 2017-02-03 | Disposition: A | Discharge status: Home / Self Care | Payer: Self-pay | Source: Ambulatory Visit | Attending: Endocrinology | Admitting: Endocrinology

## 2017-02-03 DIAGNOSIS — E0590 Thyrotoxicosis, unspecified without thyrotoxic crisis or storm: Secondary | ICD-10-CM | POA: Insufficient documentation

## 2017-02-03 MED ORDER — SODIUM IODIDE I 131 CAPSULE
11.8000 | Freq: Once | INTRAVENOUS | Status: AC
  Administered 2017-02-03: 11.8 via ORAL

## 2017-02-04 ENCOUNTER — Encounter (HOSPITAL_COMMUNITY)
Admission: RE | Admit: 2017-02-04 | Discharge: 2017-02-04 | Disposition: A | Discharge status: Home / Self Care | Payer: Self-pay | Source: Ambulatory Visit | Attending: Endocrinology | Admitting: Endocrinology

## 2017-02-07 ENCOUNTER — Telehealth: Payer: Self-pay | Admitting: Endocrinology

## 2017-02-07 ENCOUNTER — Other Ambulatory Visit: Payer: Self-pay | Admitting: Endocrinology

## 2017-02-07 DIAGNOSIS — E0590 Thyrotoxicosis, unspecified without thyrotoxic crisis or storm: Secondary | ICD-10-CM

## 2017-02-07 NOTE — Telephone Encounter (Signed)
Patient called asking when her next radioactive iodine test will be? She said she may need a 3rd test and was wondering if she needed this and when she would need this test. Please advise.

## 2017-02-07 NOTE — Telephone Encounter (Signed)
I have just ordered the radioactive iodine treatment and she will be scheduled.  As instructed she will resume methimazole 3 days after the treatment and see me about 3 weeks later

## 2017-02-07 NOTE — Telephone Encounter (Signed)
Called patient to let her know that this test has been ordered and someone will call her soon to make this appointment for her. Also to resume her methimazole as instructed.  Please schedule patient for 3 week follow up after radioactive iodine treatment. Thank you!

## 2017-02-07 NOTE — Telephone Encounter (Signed)
Patient had questions about medication and treatments she had been receiving. Asked for return phone call when possible

## 2017-02-16 ENCOUNTER — Encounter (HOSPITAL_COMMUNITY)
Admission: RE | Admit: 2017-02-16 | Discharge: 2017-02-16 | Disposition: A | Discharge status: Home / Self Care | Payer: Self-pay | Source: Ambulatory Visit | Attending: Endocrinology | Admitting: Endocrinology

## 2017-02-16 DIAGNOSIS — E0590 Thyrotoxicosis, unspecified without thyrotoxic crisis or storm: Secondary | ICD-10-CM | POA: Insufficient documentation

## 2017-02-16 LAB — HCG, SERUM, QUALITATIVE: PREG SERUM: NEGATIVE

## 2017-02-16 MED ORDER — SODIUM IODIDE I 131 CAPSULE
19.5000 | Freq: Once | INTRAVENOUS | Status: AC | PRN
  Administered 2017-02-16: 19.5 via ORAL

## 2017-03-04 ENCOUNTER — Other Ambulatory Visit (INDEPENDENT_AMBULATORY_CARE_PROVIDER_SITE_OTHER): Payer: Self-pay

## 2017-03-04 DIAGNOSIS — E0590 Thyrotoxicosis, unspecified without thyrotoxic crisis or storm: Secondary | ICD-10-CM

## 2017-03-04 LAB — T4, FREE: FREE T4: 1.03 ng/dL (ref 0.60–1.60)

## 2017-03-04 LAB — TSH

## 2017-03-08 ENCOUNTER — Encounter: Payer: Self-pay | Admitting: Endocrinology

## 2017-03-08 ENCOUNTER — Ambulatory Visit (INDEPENDENT_AMBULATORY_CARE_PROVIDER_SITE_OTHER): Payer: Self-pay | Admitting: Endocrinology

## 2017-03-08 VITALS — BP 120/72 | HR 67 | Ht 62.0 in | Wt 157.0 lb

## 2017-03-08 DIAGNOSIS — E0590 Thyrotoxicosis, unspecified without thyrotoxic crisis or storm: Secondary | ICD-10-CM

## 2017-03-08 NOTE — Progress Notes (Signed)
Patient ID: Tracy Carson, female   DOB: [birth year], 36 y.o.   MRN: [id number]                                                                                                               Reason for Appointment:  Hyperthyroidism, follow-up visit  Referring physician: Hospitalist   Chief complaint: Overactive thyroid   History of Present Illness:   Prior history: Her hyperthyroidism was diagnosed in 2012 before a pregnancy At that time she had symptoms of palpitations, shakiness, feeling excessively warm, difficulty sleeping, increased appetite and hair loss She was treated with methimazole She was recommended I-131 treatment in 2014 but even though she had an uptake test done she did not go for the treatment Subsequently has been very irregular with her follow-up program and medications She says she was taking her methimazole 2 tablets twice a day every third day or so because of cost Because of worsening symptoms of palpitations, feeling excessively hot and losing weight she presented to the emergency room on 11/08/16 and was admitted for 2 days  Recent history: However on her initial consultation in her free T4 was still very high at 3.04 and she was having problems with fatigue, weakness and heat intolerance along with tachycardia Her methimazole was increased to 3 tablets twice a day initially and subsequently this was reduced down to 20 mg twice a day  She finally agreed to do that I-131 treatment when she was seen in June and this was done with 19.5 mCi on 02/16/17  She feels better with her energy level and weight is improving No heat intolerance, now feels slightly cold at times No palpitations or tremor  Currently is taking methimazole 30 mg daily since her I-131 treatment as directed   Wt Readings from Last 3 Encounters:  03/08/17 157 lb (71.2 kg)  01/18/17 154 lb (69.9 kg)  12/29/16 150 lb 9.6 oz (68.3 kg)     Thyroid function tests as follows:     Lab  Results  Component Value Date   FREET4 1.03 03/04/2017   FREET4 0.51 (L) 01/11/2017   FREET4 3.04 (H) 12/01/2016   T3FREE 20.8 (HH) 11/15/2016   T3FREE 28.6 (H) 11/08/2016   T3FREE >30.0 Repeated and verified X2. (H) 03/18/2016   TSH <0.01 (L) 03/04/2017   TSH <0.006 (L) 11/15/2016   TSH <0.010 (L) 11/08/2016    No results found for: THYROTRECAB   Allergies as of 03/08/2017   No Known Allergies     Medication List       Accurate as of 03/08/17 12:27 PM. Always use your most recent med list.          methimazole 10 MG tablet Commonly known as:  TAPAZOLE Take 2 tablets (20 mg total) by mouth 2 (two) times daily.   olopatadine 0.1 % ophthalmic solution Commonly known as:  PATANOL Place 1 drop into both eyes 2 (two) times daily.   propranolol 10 MG tablet Commonly known as:  INDERAL Take 1 tablet (10  mg total) by mouth every 6 (six) hours.   Vitamin D (Ergocalciferol) 50000 units Caps capsule Commonly known as:  DRISDOL Take 1 capsule (50,000 Units total) by mouth every 7 (seven) days.           Past Medical History:  Diagnosis Date  . Hypertension   . Thyroid disease   . Tuberculosis    as a child    Past Surgical History:  Procedure Laterality Date  . CESAREAN SECTION  07/02/2011   Procedure: CESAREAN SECTION;  Surgeon: Roseanna Rainbow, MD;  Location: [facility];  Service: Gynecology;  Laterality: N/A;  . CHOLECYSTECTOMY      Family History  Problem Relation Age of Onset  . Diabetes Mother   . Thyroid disease Paternal Grandmother   . Thyroid disease Sister   . Thyroid disease Brother     Social History:  reports that she has been smoking Cigarettes.  She has a 0.25 pack-year smoking history. She uses smokeless tobacco. She reports that she does not drink alcohol or use drugs.  Allergies: No Known Allergies   Review of Systems     Examination:   BP 120/72   Pulse 67   Ht 5\' 2"  (1.575 m)   Wt 157 lb (71.2 kg)   SpO2 98%   BMI 28.72 kg/m     Eyes: Has mild prominence   Neck: The thyroid is enlarged about 2-2.5 times normal on the right side and firm, about twice normal of the left  Deep tendon reflexes at biceps are normal   Assessment/Plan:   Hyperthyroidism, secondary to long-standing Graves' disease   She appears to be responding to I-131 treatment. Her thyroid swelling is significantly reduced Symptomatically she is doing well Also her dose of methimazole is now only 30 mg compared to as much as 60 mg  Recent free T4 is quite normal, TSH still low as expected  She will reduce her methimazole 1 tablet daily for the next week along with 1 tablet Inderal and stopped both medications and a week Discussed possibility of hypothyroidism and will reassess her at the end of September  Patient Instructions  Take 1 pill daily for 1 week then stop of both pills      Derek Huneycutt 03/08/2017, 12:27 PM    Note: This office note was prepared with Insurance underwriter. Any transcriptional errors that result from this process are unintentional.

## 2017-03-08 NOTE — Patient Instructions (Addendum)
Take 1 pill daily for 1 week then stop of both pills

## 2017-04-18 ENCOUNTER — Other Ambulatory Visit (INDEPENDENT_AMBULATORY_CARE_PROVIDER_SITE_OTHER): Payer: No Typology Code available for payment source

## 2017-04-18 DIAGNOSIS — E0590 Thyrotoxicosis, unspecified without thyrotoxic crisis or storm: Secondary | ICD-10-CM

## 2017-04-18 LAB — TSH: TSH: <0.01 u[IU]/mL — ABNORMAL LOW (ref 0.35–4.50)

## 2017-04-18 LAB — T4, FREE: Free T4: 0.99 ng/dL (ref 0.60–1.60)

## 2017-04-21 ENCOUNTER — Ambulatory Visit (INDEPENDENT_AMBULATORY_CARE_PROVIDER_SITE_OTHER): Payer: No Typology Code available for payment source | Admitting: Endocrinology

## 2017-04-21 ENCOUNTER — Encounter: Payer: Self-pay | Admitting: Endocrinology

## 2017-04-21 VITALS — BP 126/68 | HR 86 | Ht 62.0 in | Wt 160.8 lb

## 2017-04-21 DIAGNOSIS — E0590 Thyrotoxicosis, unspecified without thyrotoxic crisis or storm: Secondary | ICD-10-CM

## 2017-04-21 NOTE — Progress Notes (Signed)
Patient ID: Tracy Carson, female   DOB: [birth year], 36 y.o.   MRN: [id number]                                                                                                               Reason for Appointment:  Hyperthyroidism, follow-up visit  Referring physician: Hospitalist   Chief complaint: Overactive thyroid   History of Present Illness:   Prior history: Her hyperthyroidism was diagnosed in 2012 before a pregnancy At that time she had symptoms of palpitations, shakiness, feeling excessively warm, difficulty sleeping, increased appetite and hair loss She was treated with methimazole She was recommended I-131 treatment in 2014 but even though she had an uptake test done she did not go for the treatment Subsequently has been very irregular with her follow-up program and medications She says she was taking her methimazole 2 tablets twice a day every third day or so because of cost Because of worsening symptoms of palpitations, feeling excessively hot and losing weight she presented to the emergency room on 11/08/16 and was admitted for 2 days  Recent history: On her initial consultation in her free T4 was still very high at 3.04 and she was having problems with fatigue, weakness and heat intolerance along with tachycardia. Her methimazole was increased to 3 tablets twice a day initially   She finally agreed to do that I-131 treatment when she was seen in June and this was done with 19.5 mCi on 02/16/17  Methimazole was stopped in the third week of August She feels fairly good without any fatigue Does have mild cold intolerance She is noticing some hair loss Also asking about gradual weight gain   Wt Readings from Last 3 Encounters:  04/21/17 160 lb 12.8 oz (72.9 kg)  03/08/17 157 lb (71.2 kg)  01/18/17 154 lb (69.9 kg)     Thyroid function tests as follows:     Lab Results  Component Value Date   FREET4 0.99 04/18/2017   FREET4 1.03 03/04/2017   FREET4 0.51 (L)  01/11/2017   T3FREE 20.8 (HH) 11/15/2016   T3FREE 28.6 (H) 11/08/2016   T3FREE >30.0 Repeated and verified X2. (H) 03/18/2016   TSH <0.01 (L) 04/18/2017   TSH <0.01 (L) 03/04/2017   TSH <0.006 (L) 11/15/2016    No results found for: THYROTRECAB   Allergies as of 04/21/2017   No Known Allergies     Medication List       Accurate as of 04/21/17 10:23 AM. Always use your most recent med list.          methimazole 10 MG tablet Commonly known as:  TAPAZOLE Take 2 tablets (20 mg total) by mouth 2 (two) times daily.   olopatadine 0.1 % ophthalmic solution Commonly known as:  PATANOL Place 1 drop into both eyes 2 (two) times daily.   propranolol 10 MG tablet Commonly known as:  INDERAL Take 1 tablet (10 mg total) by mouth every 6 (six) hours.   Vitamin D (Ergocalciferol) 50000 units Caps capsule Commonly  known as:  DRISDOL Take 1 capsule (50,000 Units total) by mouth every 7 (seven) days.           Past Medical History:  Diagnosis Date  . Hypertension   . Thyroid disease   . Tuberculosis    as a child    Past Surgical History:  Procedure Laterality Date  . CESAREAN SECTION  07/02/2011   Procedure: CESAREAN SECTION;  Surgeon: Roseanna Rainbow, MD;  Location: [facility];  Service: Gynecology;  Laterality: N/A;  . CHOLECYSTECTOMY      Family History  Problem Relation Age of Onset  . Diabetes Mother   . Thyroid disease Paternal Grandmother   . Thyroid disease Sister   . Thyroid disease Brother     Social History:  reports that she has been smoking Cigarettes.  She has a 0.25 pack-year smoking history. She has quit using smokeless tobacco. She reports that she does not drink alcohol or use drugs.  Allergies: No Known Allergies   Review of Systems     Examination:   BP 126/68   Pulse 86   Ht 5\' 2"  (1.575 m)   Wt 160 lb 12.8 oz (72.9 kg)   SpO2 98%   BMI 29.41 kg/m    Eyes: Has mild prominence   Neck: The thyroid is Barely palpable on the right  side on swallowing and not palpable on the left  Deep tendon reflexes at biceps are normal to slightly brisk   Assessment/Plan:   Hyperthyroidism, secondary to long-standing Graves' disease   She has been responding to I-131 treatment and is able to stop her methimazole Has only minimal palpable right lobe of the thyroid compared to significant goiter previously  Discussed that her weight gain and some hair loss may be related to relatively change in her thyroid level and not from I-131  Recent free T4 is again about the same and normal, TSH still low as expected  We'll continue to watch her without any medication and discussed that she may be hypothyroid in the next 1 or 2 months She will follow-up in one month  There are no Patient Instructions on file for this visit.   Kasper Mudrick 04/21/2017, 10:23 AM    Note: This office note was prepared with Dragon voice recognition system technology. Any transcriptional errors that result from this process are unintentional.

## 2017-04-22 ENCOUNTER — Encounter: Payer: Self-pay | Admitting: Family Medicine

## 2017-04-22 ENCOUNTER — Ambulatory Visit: Payer: No Typology Code available for payment source | Attending: Family Medicine | Admitting: Family Medicine

## 2017-04-22 VITALS — BP 147/90 | HR 92 | Temp 97.9°F | Ht 62.0 in | Wt 161.2 lb

## 2017-04-22 DIAGNOSIS — Z79899 Other long term (current) drug therapy: Secondary | ICD-10-CM | POA: Insufficient documentation

## 2017-04-22 DIAGNOSIS — R030 Elevated blood-pressure reading, without diagnosis of hypertension: Secondary | ICD-10-CM | POA: Insufficient documentation

## 2017-04-22 DIAGNOSIS — J039 Acute tonsillitis, unspecified: Secondary | ICD-10-CM | POA: Insufficient documentation

## 2017-04-22 DIAGNOSIS — E0500 Thyrotoxicosis with diffuse goiter without thyrotoxic crisis or storm: Secondary | ICD-10-CM | POA: Insufficient documentation

## 2017-04-22 MED ORDER — CETIRIZINE HCL 10 MG PO TABS: 10.0000 mg | ORAL_TABLET | Freq: Every day | ORAL | 1 refills | Status: DC

## 2017-04-22 MED ORDER — AMOXICILLIN 500 MG PO CAPS
500.0000 mg | ORAL_CAPSULE | Freq: Three times a day (TID) | ORAL | 0 refills | Status: DC
Start: 2017-04-22 — End: 2017-05-17

## 2017-04-22 NOTE — Patient Instructions (Signed)
Amigdalitis (Tonsillitis) La amigdalitis es una infeccin de la garganta. Esta infeccin hace que las amgdalas se vuelvan sensibles, rojas e inflamadas (hinchadas). Las amgdalas son grupos de tejido que se encuentran en la zona posterior de la garganta. Si la infeccin fue causada por una infeccin, le indicarn que tome antibiticos. A veces, los sntomas pueden aliviarse con el uso de corticoides. Si la amigdalitis es grave y ocurre con frecuencia, puede ser necesario extirpar las amgdalas (amigdalectoma). CUIDADOS EN EL HOGAR  Haga reposo y duerma con frecuencia.  Beba suficiente lquido para mantener el pis (orina) claro o de color amarillo plido.  Mientras le duela la garganta, consuma alimentos blandos o lquidos como: ? Sopa. ? Helados. ? Desayuno instantneo.  Tome helados de agua.  Puede hacerse grgaras con lquidos tibios o fros para suavizar la garganta. Hgase grgaras con una mezcla de agua con sal. Mezcle 1/4 de cucharadita de sal y 1/4 de cucharadita de bicarbonato de sodio en 1 taza de agua.  Solo tome los medicamentos que le haya indicado su mdico.  Si le han recetado medicamentos (antibiticos), tmelos segn las indicaciones. Tmelos todos, aunque se sienta mejor.  SOLICITE AYUDA SI:  Tiene bultos grandes y dolorosos en el cuello.  Tiene una erupcin cutnea.  Tiene un catarro verde, amarillo amarronado o con sangre.  No puede tragar lquidos o alimentos durante 24 horas.  Nota que solo una de las amgdalas est hinchada.  SOLICITE AYUDA DE INMEDIATO SI:  Devuelve (vomita).  Siente un dolor de cabeza muy intenso.  Presenta rigidez en el cuello.  Siente dolor en el pecho.  Tiene problemas para respirar o tragar.  Tiene dolor de garganta intenso, babeo o cambios en la voz.  Siente un dolor intenso y no lo alivian los medicamentos.  No puede abrir completamente la boca.  Tiene enrojecimiento, hinchazn o dolor fuerte en el cuello.  Tiene  fiebre.  ASEGRESE DE QUE:  Comprende estas instrucciones.  Controlar su afeccin.  Recibir ayuda de inmediato si no mejora o si empeora.  Esta informacin no tiene como fin reemplazar el consejo del mdico. Asegrese de hacerle al mdico cualquier pregunta que tenga. Document Released: 07/01/2011 Document Revised: 07/17/2013 Document Reviewed: 12/29/2012 Elsevier Interactive Patient Education  2017 Elsevier Inc.  

## 2017-04-22 NOTE — Progress Notes (Signed)
Subjective:  Patient ID: Tracy Carson, female    DOB: [birth year]  Age: 36 y.o. MRN: [id number]  CC: Cough and Back Pain   HPI Tracy Carson is a 36 year old female with hyperthyroidism secondary to Graves' disease (treated with radioactive iodine in 01/2017, previously on methimazole which was stopped 2 months ago) who presents today with a 3 day history of cough productive of whitish sputum, nasal congestion, headaches and initially had fever on the first day but has no fever now. She also complains of bilateral neck pain, shoulder pain, back pain and sore throat. Denies shortness of breath or wheezing. She has not used any over-the-counter medications.  Her last visit to endocrine was yesterday. She has gained 10 pounds since her last visit 3 months ago and last TSH was suppressed. No plans for medication at this time as per endocrine notes and she will be followed in one month.  Past Medical History:  Diagnosis Date  . Hypertension   . Thyroid disease   . Tuberculosis    as a child    Past Surgical History:  Procedure Laterality Date  . CESAREAN SECTION  07/02/2011   Procedure: CESAREAN SECTION;  Surgeon: Roseanna Rainbow, MD;  Location: [facility];  Service: Gynecology;  Laterality: N/A;  . CHOLECYSTECTOMY      No Known Allergies   Outpatient Medications Prior to Visit  Medication Sig Dispense Refill  . olopatadine (PATANOL) 0.1 % ophthalmic solution Place 1 drop into both eyes 2 (two) times daily. (Patient not taking: Reported on 04/21/2017) 5 mL 3  . methimazole (TAPAZOLE) 10 MG tablet Take 2 tablets (20 mg total) by mouth 2 (two) times daily. (Patient not taking: Reported on 04/21/2017) 120 tablet 2  . propranolol (INDERAL) 10 MG tablet Take 1 tablet (10 mg total) by mouth every 6 (six) hours. (Patient not taking: Reported on 04/21/2017) 120 tablet 1  . Vitamin D, Ergocalciferol, (DRISDOL) 50000 units CAPS capsule Take 1 capsule (50,000 Units total) by mouth every  7 (seven) days. (Patient not taking: Reported on 04/21/2017) 12 capsule 0   No facility-administered medications prior to visit.     ROS Review of Systems  Constitutional: Negative for activity change, appetite change and fatigue.  HENT: Positive for congestion, postnasal drip and sore throat. Negative for sinus pressure.   Eyes: Negative for visual disturbance.  Respiratory: Positive for cough. Negative for chest tightness, shortness of breath and wheezing.   Cardiovascular: Negative for chest pain and palpitations.  Gastrointestinal: Negative for abdominal distention, abdominal pain and constipation.  Endocrine: Negative for polydipsia.  Genitourinary: Negative for dysuria and frequency.  Musculoskeletal: Positive for back pain. Negative for arthralgias.  Skin: Negative for rash.  Neurological: Positive for headaches. Negative for tremors, light-headedness and numbness.  Hematological: Does not bruise/bleed easily.  Psychiatric/Behavioral: Negative for agitation and behavioral problems.    Objective:  BP (!) 147/90   Pulse 92   Temp 97.9 F (36.6 C) (Oral)   Ht 5\' 2"  (1.575 m)   Wt 161 lb 3.2 oz (73.1 kg)   SpO2 98%   BMI 29.48 kg/m   BP/Weight 04/22/2017 04/21/2017 03/08/2017  Systolic BP 147 126 120  Diastolic BP 90 68 72  Wt. (Lbs) 161.2 160.8 157  BMI 29.48 29.41 28.72      Physical Exam  Constitutional: She is oriented to person, place, and time. She appears well-developed and well-nourished.  Cardiovascular: Normal rate, normal heart sounds and intact distal pulses.   No murmur  heard. Pulmonary/Chest: Effort normal and breath sounds normal. She has no wheezes. She has no rales. She exhibits no tenderness.  Abdominal: Soft. Bowel sounds are normal. She exhibits no distension and no mass. There is no tenderness.  Musculoskeletal: Normal range of motion.  Neurological: She is alert and oriented to person, place, and time.   Lab Results  Component Value Date   TSH  <0.01 (L) 04/18/2017     Assessment & Plan:   1. Tonsillitis - amoxicillin (AMOXIL) 500 MG capsule; Take 1 capsule (500 mg total) by mouth 3 (three) times daily.  Dispense: 30 capsule; Refill: 0 - cetirizine (ZYRTEC) 10 MG tablet; Take 1 tablet (10 mg total) by mouth daily.  Dispense: 30 tablet; Refill: 1  2. Elevated blood pressure reading Blood pressure was previously controlled with last visit Lifestyle modifications, low-sodium diet  3. Graves disease With hyperthyroidism status post treatment with radioactive iodine Not currently on medications Follow-up with endocrine   Meds ordered this encounter  Medications  . amoxicillin (AMOXIL) 500 MG capsule    Sig: Take 1 capsule (500 mg total) by mouth 3 (three) times daily.    Dispense:  30 capsule    Refill:  0  . cetirizine (ZYRTEC) 10 MG tablet    Sig: Take 1 tablet (10 mg total) by mouth daily.    Dispense:  30 tablet    Refill:  1    Follow-up: Return in about 1 month (around 05/22/2017) for Follow-up on elevated blood pressure.   Jaclyn Shaggy MD

## 2017-05-13 ENCOUNTER — Other Ambulatory Visit (INDEPENDENT_AMBULATORY_CARE_PROVIDER_SITE_OTHER): Payer: Self-pay

## 2017-05-13 DIAGNOSIS — E0590 Thyrotoxicosis, unspecified without thyrotoxic crisis or storm: Secondary | ICD-10-CM

## 2017-05-13 LAB — T4, FREE: Free T4: 0.77 ng/dL (ref 0.60–1.60)

## 2017-05-13 LAB — TSH

## 2017-05-17 ENCOUNTER — Ambulatory Visit (INDEPENDENT_AMBULATORY_CARE_PROVIDER_SITE_OTHER): Payer: No Typology Code available for payment source | Admitting: Endocrinology

## 2017-05-17 ENCOUNTER — Encounter: Payer: Self-pay | Admitting: Endocrinology

## 2017-05-17 VITALS — BP 140/82 | HR 82 | Ht 62.0 in | Wt 162.4 lb

## 2017-05-17 DIAGNOSIS — E0500 Thyrotoxicosis with diffuse goiter without thyrotoxic crisis or storm: Secondary | ICD-10-CM

## 2017-05-17 NOTE — Progress Notes (Signed)
Patient ID: Tracy Carson, female   DOB: [birth year], 36 y.o.   MRN: [id number]                                                                                                               Reason for Appointment:  Hyperthyroidism, follow-up visit   Chief complaint: Overactive thyroid   History of Present Illness:   Prior history: Her hyperthyroidism was diagnosed in 2012 before a pregnancy At that time she had symptoms of palpitations, shakiness, feeling excessively warm, difficulty sleeping, increased appetite and hair loss She was treated with methimazole She was recommended I-131 treatment in 2014 but even though she had an uptake test done she did not go for the treatment Subsequently has been very irregular with her follow-up program and medications She says she was taking her methimazole 2 tablets twice a day every third day or so because of cost Because of worsening symptoms of palpitations, feeling excessively hot and losing weight she presented to the emergency room on 11/08/16 and was admitted for 2 days  Recent history: On her initial consultation in her free T4 was still very high at 3.04 and she was having problems with fatigue, weakness and heat intolerance along with tachycardia. Her methimazole was increased to 3 tablets twice a day initially   She finally agreed to do that I-131 treatment when she was seen in June and this was done with 19.5 mCi on 02/16/17 Methimazole was stopped in the third week of August   She is now here for her 3 month follow-up; her free T4 level has been normal  She  again feels fairly good without any fatigue Has not had any recent weight change, skin or hair changes No unusual cold intolerance   Wt Readings from Last 3 Encounters:  05/17/17 162 lb 6.4 oz (73.7 kg)  04/22/17 161 lb 3.2 oz (73.1 kg)  04/21/17 160 lb 12.8 oz (72.9 kg)     Thyroid function tests as follows:     Lab Results  Component Value Date   FREET4 0.77  05/13/2017   FREET4 0.99 04/18/2017   FREET4 1.03 03/04/2017   T3FREE 20.8 (HH) 11/15/2016   T3FREE 28.6 (H) 11/08/2016   T3FREE >30.0 Repeated and verified X2. (H) 03/18/2016   TSH <0.01 (L) 05/13/2017   TSH <0.01 (L) 04/18/2017   TSH <0.01 (L) 03/04/2017    No results found for: THYROTRECAB   Allergies as of 05/17/2017   No Known Allergies     Medication List       Accurate as of 05/17/17 11:47 AM. Always use your most recent med list.          amoxicillin 500 MG capsule Commonly known as:  AMOXIL Take 1 capsule (500 mg total) by mouth 3 (three) times daily.   cetirizine 10 MG tablet Commonly known as:  ZYRTEC Take 1 tablet (10 mg total) by mouth daily.   olopatadine 0.1 % ophthalmic solution Commonly known as:  PATANOL Place 1 drop into both eyes  2 (two) times daily.           Past Medical History:  Diagnosis Date  . Hypertension   . Thyroid disease   . Tuberculosis    as a child    Past Surgical History:  Procedure Laterality Date  . CESAREAN SECTION  07/02/2011   Procedure: CESAREAN SECTION;  Surgeon: Roseanna Rainbow, MD;  Location: [facility];  Service: Gynecology;  Laterality: N/A;  . CHOLECYSTECTOMY      Family History  Problem Relation Age of Onset  . Diabetes Mother   . Thyroid disease Paternal Grandmother   . Thyroid disease Sister   . Thyroid disease Brother     Social History:  reports that she has been smoking Cigarettes.  She has a 0.25 pack-year smoking history. She has quit using smokeless tobacco. She reports that she does not drink alcohol or use drugs.  Allergies: No Known Allergies   Review of Systems     Examination:   BP 140/82   Pulse 82   Ht 5\' 2"  (1.575 m)   Wt 162 lb 6.4 oz (73.7 kg)   SpO2 98%   BMI 29.70 kg/m    Eyes: Has mild prominence   Neck: The thyroid is  not palpable  Deep tendon reflexes at biceps are normal   Assessment/Plan:   Graves' disease, treated successfully with radioactive iodine  in July Her goiter has resolved However although her free T4 has come down slightly it is still in the normal range at 0.77 and her TSH is still suppressed Not clear if her thyroid levels will decline further but also possibly may have a relapse of her hyperthyroidism she has not had complete ablation  Will follow-up in 2 months again but she will call if she has any symptoms previous to that  There are no Patient Instructions on file for this visit.   Jamall Strohmeier 05/17/2017, 11:47 AM    Note: This office note was prepared with Dragon voice recognition system technology. Any transcriptional errors that result from this process are unintentional.

## 2017-06-22 ENCOUNTER — Ambulatory Visit: Payer: Self-pay | Admitting: Family Medicine

## 2017-08-05 ENCOUNTER — Other Ambulatory Visit: Payer: No Typology Code available for payment source

## 2017-08-10 ENCOUNTER — Ambulatory Visit: Payer: No Typology Code available for payment source | Admitting: Endocrinology

## 2017-08-10 DIAGNOSIS — Z0289 Encounter for other administrative examinations: Secondary | ICD-10-CM

## 2017-10-18 ENCOUNTER — Ambulatory Visit: Payer: No Typology Code available for payment source

## 2018-04-16 IMAGING — MG 2D DIGITAL DIAGNOSTIC BILATERAL MAMMOGRAM WITH CAD AND ADJUNCT T
8 of 16 series · 8 of 40 positions shown · non-contrast
Comparison: None.

CLINICAL DATA: 34-year-old female presenting for evaluation of
bilateral diffuse breast pain.

EXAM:
2D DIGITAL DIAGNOSTIC BILATERAL MAMMOGRAM WITH CAD AND ADJUNCT TOMO

[L MLO]
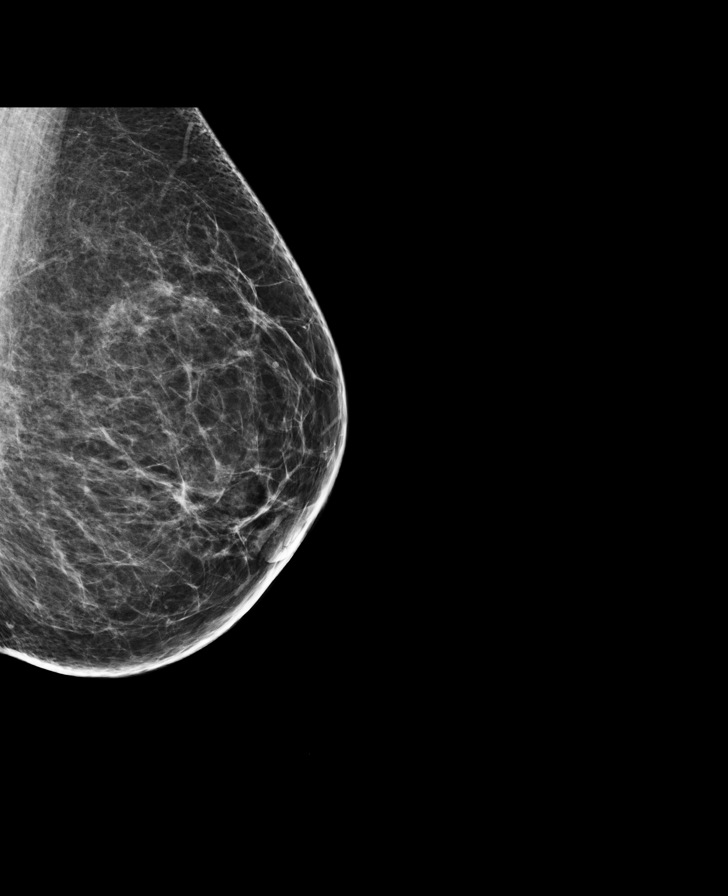

[L CC]
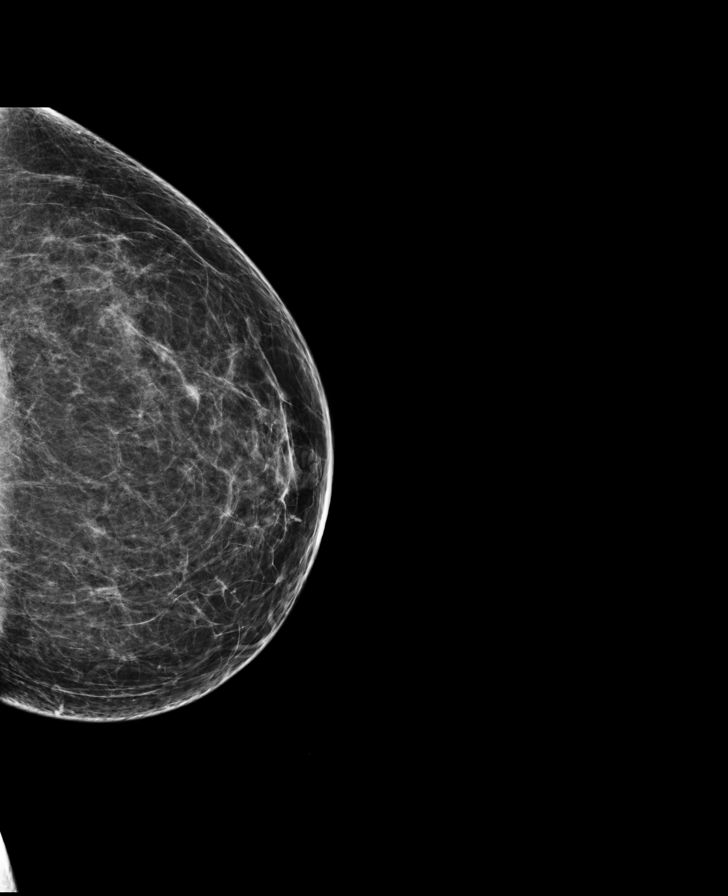

[R MLO (1 of 2)]
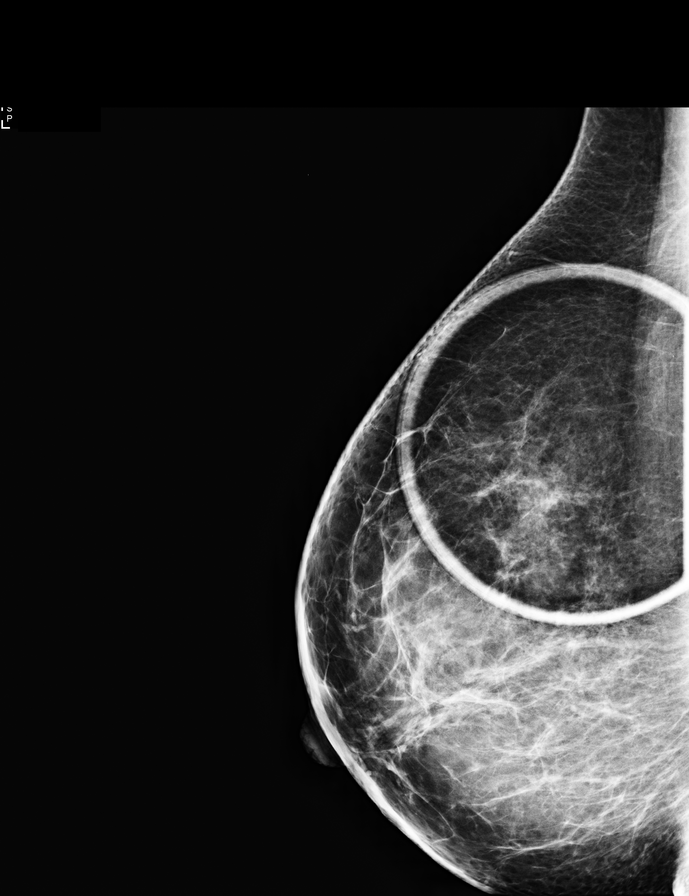

[R CC synth-2D]
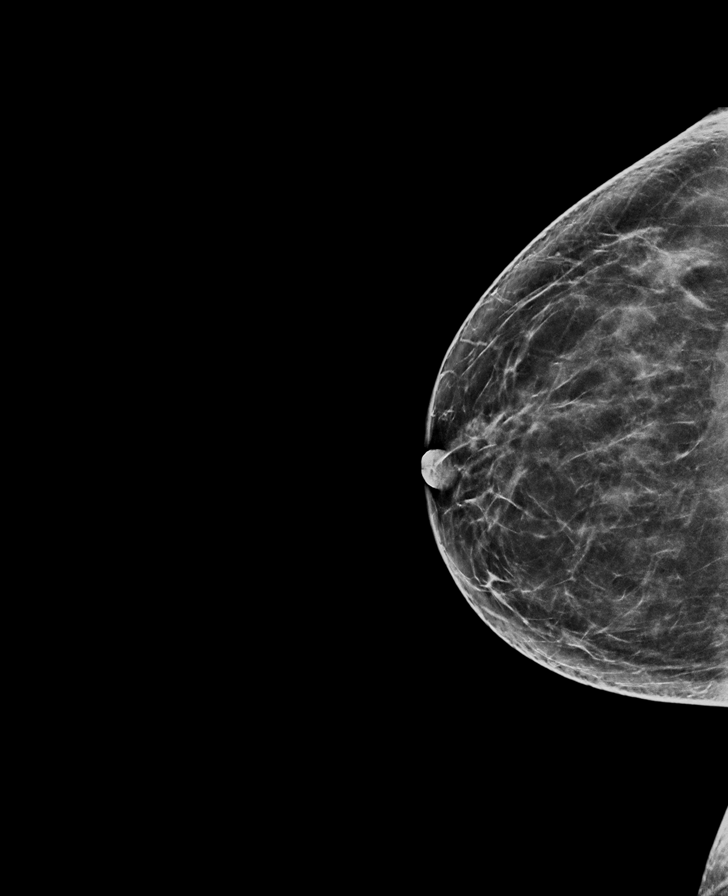

[R CC]
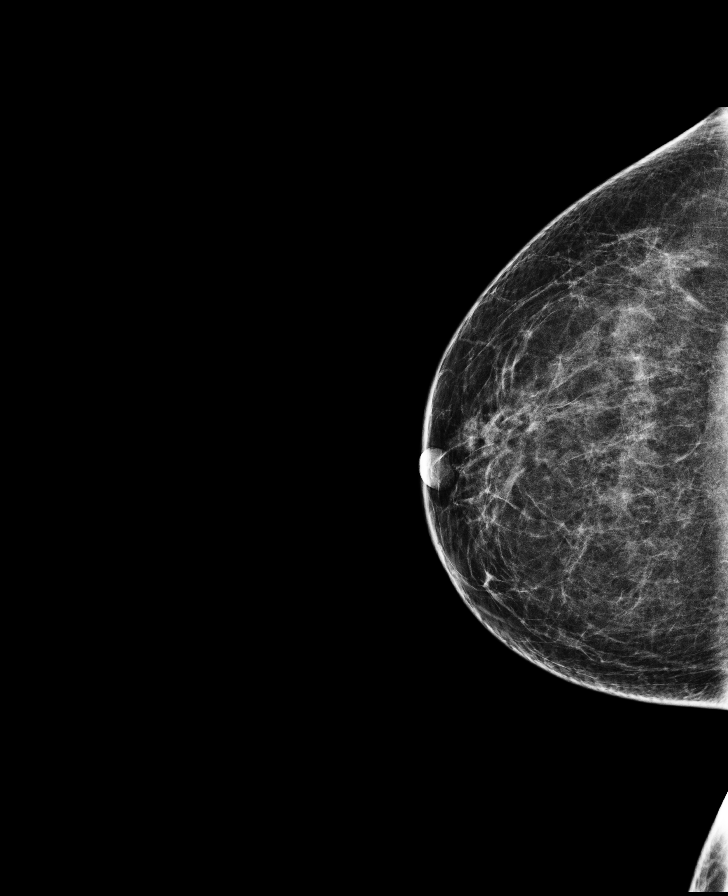

[L MLO synth-2D]
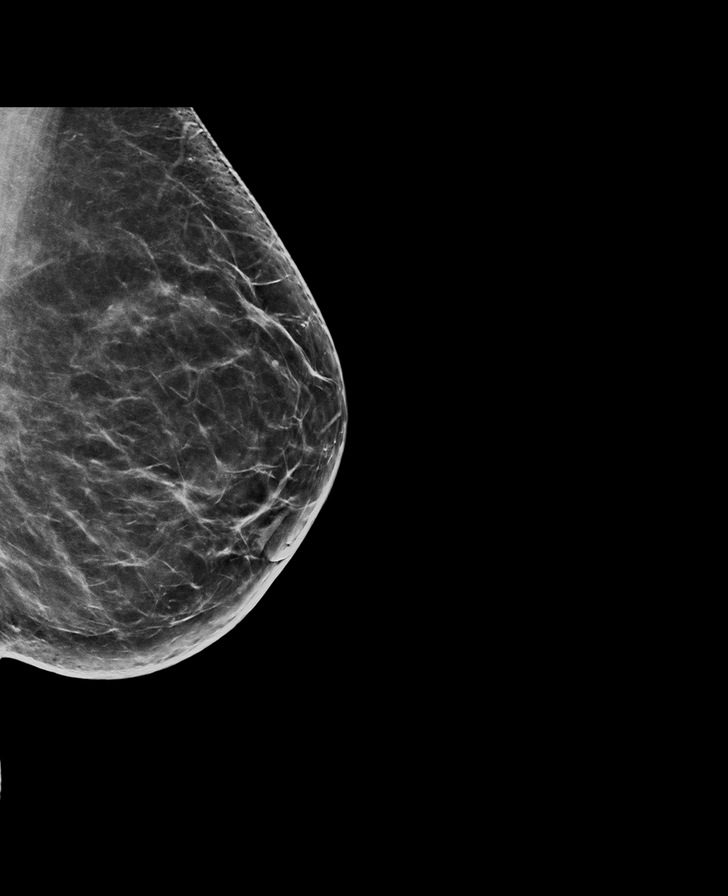

[R MLO synth-2D]
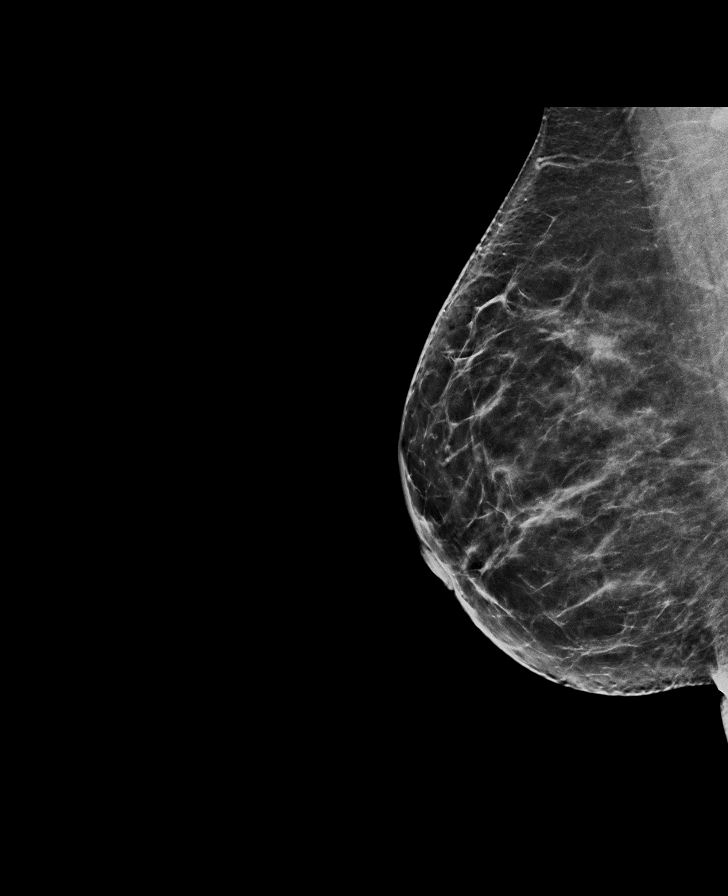

[R MLO (2 of 2)]
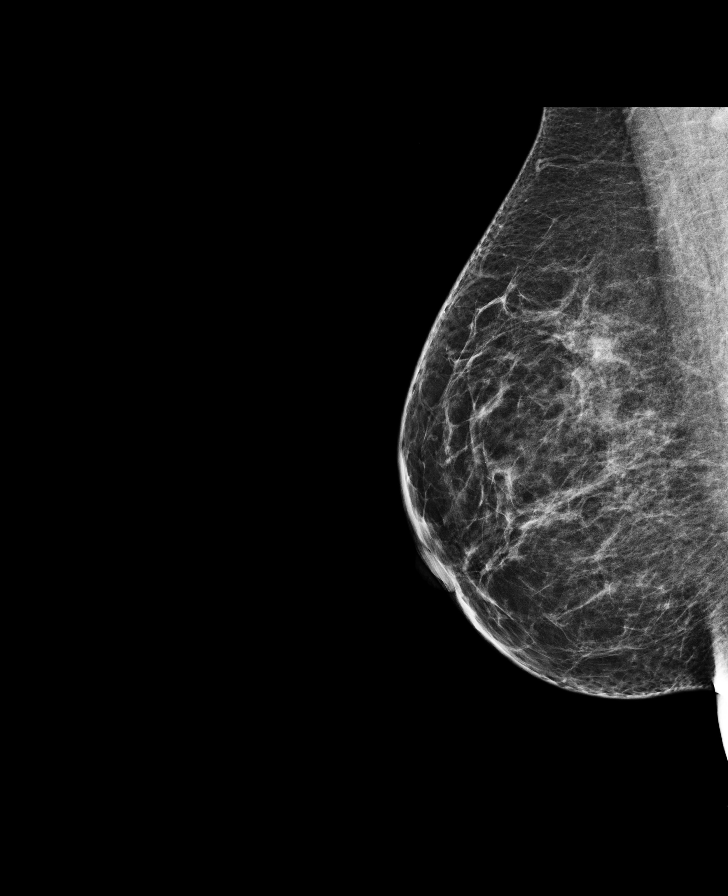

[8 of 40 positions shown; findings below may reference images not displayed]

ACR Breast Density Category b: There are scattered areas of
fibroglandular density.
FINDINGS: There is an asymmetry in the superior right breast on the MLO view
which resolves with additional spot compression tomosynthesis
imaging consistent with normal fibroglandular tissue. No suspicious
calcifications, masses or areas of distortion are seen in the
bilateral breasts.

Mammographic images were processed with CAD.
IMPRESSION: 1. No mammographic findings to explain the patient's bilateral
breast pain.

2.  No mammographic evidence of malignancy in the bilateral breasts.

RECOMMENDATION:
1. Clinical follow-up recommended for the bilateral breast pain. Any
further workup should be based on clinical grounds.

2. Screening mammogram at age 40 unless there are persistent or
intervening clinical concerns. (Code:CB-[id number])

I have discussed the findings and recommendations with the patient.
Results were also provided in writing at the conclusion of the
visit. If applicable, a reminder letter will be sent to the patient
regarding the next appointment.

BI-RADS CATEGORY  1: Negative.

## 2018-12-28 ENCOUNTER — Emergency Department (HOSPITAL_COMMUNITY)
Admission: EM | Admit: 2018-12-28 | Discharge: 2018-12-29 | Disposition: A | Discharge status: Home / Self Care | Payer: No Typology Code available for payment source | Attending: Emergency Medicine | Admitting: Emergency Medicine

## 2018-12-28 ENCOUNTER — Other Ambulatory Visit: Payer: Self-pay

## 2018-12-28 DIAGNOSIS — K1120 Sialoadenitis, unspecified: Secondary | ICD-10-CM

## 2018-12-28 DIAGNOSIS — I10 Essential (primary) hypertension: Secondary | ICD-10-CM | POA: Insufficient documentation

## 2018-12-28 DIAGNOSIS — R220 Localized swelling, mass and lump, head: Secondary | ICD-10-CM | POA: Insufficient documentation

## 2018-12-28 DIAGNOSIS — E079 Disorder of thyroid, unspecified: Secondary | ICD-10-CM | POA: Insufficient documentation

## 2018-12-28 DIAGNOSIS — F17210 Nicotine dependence, cigarettes, uncomplicated: Secondary | ICD-10-CM | POA: Insufficient documentation

## 2018-12-28 NOTE — ED Triage Notes (Signed)
Per pt she noticed some swelling in her gums in the right cheek and now is in her neck area. Pt says it does not hurt to chew or eat. More p[aoinful in her neck area.

## 2018-12-29 ENCOUNTER — Encounter (HOSPITAL_COMMUNITY): Payer: Self-pay | Admitting: Radiology

## 2018-12-29 ENCOUNTER — Emergency Department (HOSPITAL_COMMUNITY): Payer: Self-pay

## 2018-12-29 LAB — CBC WITH DIFFERENTIAL/PLATELET
Abs Immature Granulocytes: 0.02 10*3/uL (ref 0.00–0.07)
Basophils Absolute: 0 10*3/uL (ref 0.0–0.1)
Basophils Relative: 0 %
Eosinophils Absolute: 0.2 10*3/uL (ref 0.0–0.5)
Eosinophils Relative: 3 %
HCT: 42 % (ref 36.0–46.0)
Hemoglobin: 15.1 g/dL — ABNORMAL HIGH (ref 12.0–15.0)
Immature Granulocytes: 0 %
Lymphocytes Relative: 32 %
Lymphs Abs: 2.5 10*3/uL (ref 0.7–4.0)
MCH: 30.9 pg (ref 26.0–34.0)
MCHC: 36 g/dL (ref 30.0–36.0)
MCV: 85.9 fL (ref 80.0–100.0)
Monocytes Absolute: 0.7 10*3/uL (ref 0.1–1.0)
Monocytes Relative: 9 %
Neutro Abs: 4.3 10*3/uL (ref 1.7–7.7)
Neutrophils Relative %: 56 %
Platelets: 186 10*3/uL (ref 150–400)
RBC: 4.89 MIL/uL (ref 3.87–5.11)
RDW: 11.8 % (ref 11.5–15.5)
WBC: 7.7 10*3/uL (ref 4.0–10.5)
nRBC: 0 % (ref 0.0–0.2)

## 2018-12-29 LAB — BASIC METABOLIC PANEL
Anion gap: 8 (ref 5–15)
BUN: 9 mg/dL (ref 6–20)
CO2: 21 mmol/L — ABNORMAL LOW (ref 22–32)
Calcium: 9 mg/dL (ref 8.9–10.3)
Chloride: 109 mmol/L (ref 98–111)
Creatinine, Ser: 0.56 mg/dL (ref 0.44–1.00)
GFR calc Af Amer: >60 mL/min (ref >60)
GFR calc non Af Amer: >60 mL/min (ref >60)
Glucose, Bld: 125 mg/dL — ABNORMAL HIGH (ref 70–99)
Potassium: 4.1 mmol/L (ref 3.5–5.1)
Sodium: 138 mmol/L (ref 135–145)

## 2018-12-29 LAB — I-STAT BETA HCG BLOOD, ED (MC, WL, AP ONLY): I-stat hCG, quantitative: <5 m[IU]/mL (ref <5)

## 2018-12-29 MED ORDER — IBUPROFEN 800 MG PO TABS: 800.0000 mg | ORAL_TABLET | Freq: Three times a day (TID) | ORAL | 0 refills | Status: DC

## 2018-12-29 MED ORDER — IOHEXOL 300 MG/ML  SOLN
75.0000 mL | Freq: Once | INTRAMUSCULAR | Status: AC | PRN
  Administered 2018-12-29: 75 mL via INTRAVENOUS

## 2018-12-29 NOTE — ED Notes (Signed)
Taken to CT.

## 2018-12-29 NOTE — ED Provider Notes (Signed)
[facility] EMERGENCY DEPARTMENT Provider Note   CSN: [id number] Arrival date & time: 12/28/18  2308    History   Chief Complaint Chief Complaint  Patient presents with  . Oral Swelling  . Neck Pain    HPI Elnoria Schleisman is a 38 y.o. female.     Patient with a history of hyperthyroidism to ED for evaluation of sudden onset right sided neck pain and swelling this evening. She denies dental pain or issue, sore throat, fever. She applied an ice pack to the area with improvement but not resolution in swelling. No injury, discoloration or previous similar history. Nothing makes it better and palpation makes the pain worse. No difficulty breathing or swallowing.   The history is provided by the patient. No language interpreter was used.  Neck Pain  Associated symptoms: no fever and no headaches     Past Medical History:  Diagnosis Date  . Hypertension   . Thyroid disease   . Tuberculosis    as a child    Patient Active Problem List   Diagnosis Date Noted  . 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  . History of blurry vision 05/20/2014  . History of itching of eye 05/20/2014  . Other fatigue 05/20/2014  . Tobacco dependence 05/20/2014  . Palpitations 05/20/2014  . Abdominal pain 11/03/2012  . 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 Tachycardia 11/03/2012  . Hematochezia 11/03/2012  . Graves disease 11/03/2012  . Anemia 11/03/2012  . Breech presentation 07/02/2011  . Cesarean delivery, without mention of indication, delivered, with or without mention of antepartum condition 07/02/2011    Past Surgical History:  Procedure Laterality Date  . CESAREAN SECTION  07/02/2011   Procedure: CESAREAN SECTION;  Surgeon: Roseanna Rainbow, MD;  Location: [facility];  Service: Gynecology;  Laterality: N/A;  . CHOLECYSTECTOMY       OB History    Gravida  4   Para  4   Term  4   Preterm      AB      Living  4     SAB      TAB      Ectopic      Multiple      Live Births   3            Home Medications    Prior to Admission medications   Medication Sig Start Date End Date Taking? Authorizing Provider  cetirizine (ZYRTEC) 10 MG tablet Take 1 tablet (10 mg total) by mouth daily. 04/22/17   [name], MD  olopatadine (PATANOL) 0.1 % ophthalmic solution Place 1 drop into both eyes 2 (two) times daily. 12/29/16   [name], MD    Family History Family History  Problem Relation Age of Onset  . Diabetes Mother   . Thyroid disease Paternal Grandmother   . Thyroid disease Sister   . Thyroid disease Brother     Social History Social History   Tobacco Use  . Smoking status: Current Some Day Smoker    Packs/day: 0.25    Years: 1.00    Pack years: 0.25    Types: Cigarettes  . Smokeless tobacco: Former Engineer, water Use Topics  . Alcohol use: No  . Drug use: No     Allergies   Patient has no known allergies.   Review of Systems Review of Systems  Constitutional: Negative for fever.  HENT: Negative for congestion, dental problem, mouth sores and sore throat.   Respiratory: Negative for shortness of  breath and stridor.   Gastrointestinal: Negative for nausea.  Musculoskeletal: Positive for neck pain.  Neurological: Negative for headaches.     Physical Exam Updated Vital Signs BP (!) 146/86 (BP Location: Right Arm)   Pulse 97   Temp 98.6 F (37 C) (Oral)   Resp 16   SpO2 98%   Physical Exam Constitutional:      Appearance: She is well-developed.  HENT:     Head: Normocephalic.     Right Ear: Tympanic membrane normal.     Left Ear: Tympanic membrane normal.     Nose: Nose normal.     Mouth/Throat:     Mouth: Mucous membranes are moist.     Comments: Generally good dentition. No dental tenderness, swelling of gums or buccal tissue and no visualized abscess.  Neck:     Musculoskeletal: Normal range of motion and neck supple.     Comments: There is mild swelling of the right anterolateral neck without induration, mass  or discoloration. Tenderness extends from right submental to anterior cervical neck and postauricular area. No left sided swelling. Cardiovascular:     Rate and Rhythm: Normal rate and regular rhythm.  Pulmonary:     Effort: Pulmonary effort is normal.     Breath sounds: Normal breath sounds. No stridor.  Abdominal:     General: Bowel sounds are normal.     Palpations: Abdomen is soft.     Tenderness: There is no abdominal tenderness. There is no guarding or rebound.  Musculoskeletal: Normal range of motion.  Skin:    General: Skin is warm and dry.     Findings: No rash.  Neurological:     General: No focal deficit present.     Mental Status: She is alert and oriented to person, place, and time.      ED Treatments / Results  Labs (all labs ordered are listed, but only abnormal results are displayed) Labs Reviewed  BASIC METABOLIC PANEL  CBC WITH DIFFERENTIAL/PLATELET  I-STAT BETA HCG BLOOD, ED ([facility], [facility], [facility] ONLY)    EKG None  Radiology No results found.  Procedures Procedures (including critical care time)  Medications Ordered in ED Medications - No data to display   Initial Impression / Assessment and Plan / ED Course  I have reviewed the triage vital signs and the nursing notes.  Pertinent labs & imaging results that were available during my care of the patient were reviewed by me and considered in my medical decision making (see chart for details).        Patient here for sudden onset of right sided neck swelling and pain. No SOB, restricted breathing or painful swallowing.  Unknown cause of swelling in a very well appearing patient. Consider soft tissue infection, apical abscess of molar, salivary gland stone. CT soft tissue neck w/CM ordered. Basic labs obtained.   Patient care signed out to Ivar Drapeob Browning, PA-C, for review of lab/imaging results and disposition.  Final Clinical Impressions(s) / ED Diagnoses   Final diagnoses:  None   1. Neck pain  ED  Discharge Orders    None       Elpidio AnisUpstill, Serine Kea, Cordelia Poche-C 12/29/18 [id number]    Mesner, Barbara CowerJason, MD 12/29/18 [phone]

## 2019-01-02 ENCOUNTER — Ambulatory Visit: Payer: No Typology Code available for payment source | Admitting: Internal Medicine

## 2019-01-05 ENCOUNTER — Telehealth: Payer: Self-pay | Admitting: Family Medicine

## 2019-01-05 NOTE — Telephone Encounter (Signed)
Patient called stating her face feels numb and hands and feels frustrated. Please follow up.

## 2019-01-05 NOTE — Telephone Encounter (Signed)
Patients call taken.  Patient identified by name and date of birth.  Patient statesshe has right side facial and arm numbness.  Patient seen at ED and stroke was ruled out.  Patient states that she is able to speak normally and can use her hand and arm normally, only that it is numb.  Patient denies chest pain, shortness of breath, sore throat, or any abdominal ailments.  Patient advised to take either ibuprofen or tylenol and call triage nurse on Monday morning.  Patient does have appointment next week.  Patient advised that if symptoms did not improve then they should go to the Emergency Department or Urgent Care.  Patient acknowledged understanding of advice.

## 2019-01-06 ENCOUNTER — Encounter (HOSPITAL_COMMUNITY): Payer: Self-pay

## 2019-01-06 ENCOUNTER — Emergency Department (HOSPITAL_COMMUNITY)
Admission: EM | Admit: 2019-01-06 | Discharge: 2019-01-06 | Disposition: A | Discharge status: Home / Self Care | Payer: No Typology Code available for payment source | Attending: Emergency Medicine | Admitting: Emergency Medicine

## 2019-01-06 ENCOUNTER — Other Ambulatory Visit: Payer: Self-pay

## 2019-01-06 DIAGNOSIS — R590 Localized enlarged lymph nodes: Secondary | ICD-10-CM | POA: Insufficient documentation

## 2019-01-06 DIAGNOSIS — F17210 Nicotine dependence, cigarettes, uncomplicated: Secondary | ICD-10-CM | POA: Insufficient documentation

## 2019-01-06 DIAGNOSIS — E0590 Thyrotoxicosis, unspecified without thyrotoxic crisis or storm: Secondary | ICD-10-CM | POA: Insufficient documentation

## 2019-01-06 DIAGNOSIS — M542 Cervicalgia: Secondary | ICD-10-CM | POA: Insufficient documentation

## 2019-01-06 DIAGNOSIS — F419 Anxiety disorder, unspecified: Secondary | ICD-10-CM | POA: Insufficient documentation

## 2019-01-06 LAB — COMPREHENSIVE METABOLIC PANEL
ALT: 27 U/L (ref 0–44)
AST: 25 U/L (ref 15–41)
Albumin: 3.8 g/dL (ref 3.5–5.0)
Alkaline Phosphatase: 122 U/L (ref 38–126)
Anion gap: 7 (ref 5–15)
BUN: 6 mg/dL (ref 6–20)
CO2: 21 mmol/L — ABNORMAL LOW (ref 22–32)
Calcium: 9.1 mg/dL (ref 8.9–10.3)
Chloride: 109 mmol/L (ref 98–111)
Creatinine, Ser: 0.49 mg/dL (ref 0.44–1.00)
GFR calc Af Amer: >60 mL/min (ref >60)
GFR calc non Af Amer: >60 mL/min (ref >60)
Glucose, Bld: 121 mg/dL — ABNORMAL HIGH (ref 70–99)
Potassium: 3.8 mmol/L (ref 3.5–5.1)
Sodium: 137 mmol/L (ref 135–145)
Total Bilirubin: 0.5 mg/dL (ref 0.3–1.2)
Total Protein: 7.2 g/dL (ref 6.5–8.1)

## 2019-01-06 LAB — CBC WITH DIFFERENTIAL/PLATELET
Abs Immature Granulocytes: 0.01 10*3/uL (ref 0.00–0.07)
Basophils Absolute: 0 10*3/uL (ref 0.0–0.1)
Basophils Relative: 1 %
Eosinophils Absolute: 0.1 10*3/uL (ref 0.0–0.5)
Eosinophils Relative: 1 %
HCT: 45.1 % (ref 36.0–46.0)
Hemoglobin: 15.8 g/dL — ABNORMAL HIGH (ref 12.0–15.0)
Immature Granulocytes: 0 %
Lymphocytes Relative: 30 %
Lymphs Abs: 1.8 10*3/uL (ref 0.7–4.0)
MCH: 30.1 pg (ref 26.0–34.0)
MCHC: 35 g/dL (ref 30.0–36.0)
MCV: 85.9 fL (ref 80.0–100.0)
Monocytes Absolute: 0.4 10*3/uL (ref 0.1–1.0)
Monocytes Relative: 6 %
Neutro Abs: 3.8 10*3/uL (ref 1.7–7.7)
Neutrophils Relative %: 62 %
Platelets: 203 10*3/uL (ref 150–400)
RBC: 5.25 MIL/uL — ABNORMAL HIGH (ref 3.87–5.11)
RDW: 11.5 % (ref 11.5–15.5)
WBC: 6 10*3/uL (ref 4.0–10.5)
nRBC: 0 % (ref 0.0–0.2)

## 2019-01-06 LAB — GROUP A STREP BY PCR: Group A Strep by PCR: NOT DETECTED

## 2019-01-06 LAB — T4, FREE: Free T4: 2.3 ng/dL — ABNORMAL HIGH (ref 0.61–1.12)

## 2019-01-06 MED ORDER — HYDROXYZINE HCL 25 MG PO TABS
25.0000 mg | ORAL_TABLET | Freq: Once | ORAL | Status: AC
  Administered 2019-01-06: 25 mg via ORAL
  Filled 2019-01-06: qty 1

## 2019-01-06 MED ORDER — METHIMAZOLE 10 MG PO TABS: 10.0000 mg | ORAL_TABLET | Freq: Two times a day (BID) | ORAL | 0 refills | Status: DC

## 2019-01-06 MED ORDER — HYDROXYZINE HCL 25 MG PO TABS: 25.0000 mg | ORAL_TABLET | Freq: Four times a day (QID) | ORAL | 0 refills | Status: DC | PRN
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
## 2019-01-06 NOTE — ED Notes (Signed)
Pt verbalized understanding of d/c instructions and has no further questions, VSS, NAD. Pt to follow up with ENT and endocrinology.

## 2019-01-06 NOTE — Discharge Instructions (Addendum)
Follow-up with your endocrinologist and with your primary physician.  With your persistent facial and neck swelling and pain follow-up closely with the ear nose and throat doctor.  Return immediately for any worsening of your symptoms or concerns.

## 2019-01-06 NOTE — ED Triage Notes (Signed)
Pt endorses right facial/neck/head pain x 1 week. Denies dental pain. VSS. No neuro defeficits

## 2019-01-07 LAB — TSH: TSH: <0.01 u[IU]/mL — ABNORMAL LOW (ref 0.350–4.500)

## 2019-01-10 ENCOUNTER — Telehealth: Payer: Self-pay | Admitting: Family Medicine

## 2019-01-10 ENCOUNTER — Ambulatory Visit: Payer: No Typology Code available for payment source

## 2019-01-10 NOTE — Telephone Encounter (Signed)
Patient called stating her head hurts and neck please follow up

## 2019-01-10 NOTE — Telephone Encounter (Signed)
route
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
## 2019-01-10 NOTE — Telephone Encounter (Signed)
Patients call returned.  Patient identified by name and date of birth.  Patient complaining of a headache and neck pain.  Paitent seemed very anxious on the phone.  Patient has been prescribed atarax and ibuprofen.  Patient has appointment in the morning.  Patient was advised that she could alternate tylenol and ibuprofen if ibuprofen did not work on its own.    Patient was told that if his condition worsened and/or change for the worse to go to the Emergency Department or Urgent care.  Patient acknowledged understanding of advice.

## 2019-01-11 ENCOUNTER — Ambulatory Visit: Payer: Self-pay | Attending: Internal Medicine | Admitting: Physician Assistant

## 2019-01-11 ENCOUNTER — Other Ambulatory Visit: Payer: Self-pay

## 2019-01-11 DIAGNOSIS — R030 Elevated blood-pressure reading, without diagnosis of hypertension: Secondary | ICD-10-CM

## 2019-01-11 DIAGNOSIS — Z789 Other specified health status: Secondary | ICD-10-CM

## 2019-01-11 DIAGNOSIS — E0500 Thyrotoxicosis with diffuse goiter without thyrotoxic crisis or storm: Secondary | ICD-10-CM

## 2019-01-11 DIAGNOSIS — M62838 Other muscle spasm: Secondary | ICD-10-CM

## 2019-01-11 DIAGNOSIS — F419 Anxiety disorder, unspecified: Secondary | ICD-10-CM

## 2019-01-11 DIAGNOSIS — Z09 Encounter for follow-up examination after completed treatment for conditions other than malignant neoplasm: Secondary | ICD-10-CM

## 2019-01-11 DIAGNOSIS — K111 Hypertrophy of salivary gland: Secondary | ICD-10-CM

## 2019-01-11 MED ORDER — HYDROXYZINE HCL 25 MG PO TABS: 25.0000 mg | ORAL_TABLET | Freq: Four times a day (QID) | ORAL | 0 refills | Status: DC | PRN

## 2019-01-11 MED ORDER — METHOCARBAMOL 500 MG PO TABS: 1000.0000 mg | ORAL_TABLET | Freq: Three times a day (TID) | ORAL | 0 refills | Status: DC | PRN

## 2019-01-11 MED ORDER — METHIMAZOLE 10 MG PO TABS: 10.0000 mg | ORAL_TABLET | Freq: Two times a day (BID) | ORAL | 0 refills | Status: DC

## 2019-01-11 MED ORDER — IBUPROFEN 800 MG PO TABS: 800.0000 mg | ORAL_TABLET | Freq: Three times a day (TID) | ORAL | 0 refills | Status: DC | PRN

## 2019-01-11 NOTE — Progress Notes (Signed)
Patient is following up on neck pain from her hospital visit.  Pt. Stated she have pain on her left and her temple.

## 2019-01-13 ENCOUNTER — Other Ambulatory Visit: Payer: Self-pay

## 2019-01-13 ENCOUNTER — Ambulatory Visit (HOSPITAL_COMMUNITY)
Admission: EM | Admit: 2019-01-13 | Discharge: 2019-01-13 | Disposition: A | Discharge status: Home / Self Care | Payer: No Typology Code available for payment source | Attending: Family Medicine | Admitting: Family Medicine

## 2019-01-13 DIAGNOSIS — M542 Cervicalgia: Secondary | ICD-10-CM

## 2019-01-13 MED ORDER — MELOXICAM 15 MG PO TABS: 15.0000 mg | ORAL_TABLET | Freq: Every day | ORAL | 1 refills | Status: DC

## 2019-01-13 MED ORDER — KETOROLAC TROMETHAMINE 30 MG/ML IJ SOLN
30.0000 mg | Freq: Once | INTRAMUSCULAR | Status: AC
  Administered 2019-01-13: 30 mg via INTRAMUSCULAR

## 2019-01-13 MED ORDER — KETOROLAC TROMETHAMINE 30 MG/ML IJ SOLN
INTRAMUSCULAR | Status: AC
  Filled 2019-01-13: qty 1

## 2019-01-13 NOTE — Discharge Instructions (Addendum)
Continue methocarbamol.  You were given a Toradol injection today to help reduce inflammation.  You will start meloxicam as needed once daily tomorrow.  If symptoms continue to be present without significant improvement please follow-up with primary care to be referred to an orthopedic specialist.

## 2019-01-13 NOTE — ED Provider Notes (Signed)
[facility]    CSN: [id number] Arrival date & time: 01/13/19  1600      History   Chief Complaint Chief Complaint  Patient presents with  . Neck Pain    HPI Tracy Carson is a 38 y.o. female.   HPI  Neck Pain: Paitent complains of neck pain. Patient was treated for neck pain by PCP 01/11/19 methocarbamol.  She reports some relief with the muscle relaxer however continues to have intermittent pain.  Reports a tightness around her neck causes anxiety.  She is concerned that her blood pressure rises when she gets anxious.  She was seen at the ER 12/28/18 and diagnosed with possible sialadenitis. Prescribed ibuprofen. Patient today reports that she is not taking any antiinflammatory. She has not seen an ortho specialist. Past Medical History:  Diagnosis Date  . Hypertension   . Thyroid disease   . Tuberculosis    as a child    Patient Active Problem List   Diagnosis Date Noted  . 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  . History of blurry vision 05/20/2014  . History of itching of eye 05/20/2014  . Other fatigue 05/20/2014  . Tobacco dependence 05/20/2014  . Palpitations 05/20/2014  . Abdominal pain 11/03/2012  . 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 Tachycardia 11/03/2012  . Hematochezia 11/03/2012  . Graves disease 11/03/2012  . Anemia 11/03/2012  . Breech presentation 07/02/2011  . Cesarean delivery, without mention of indication, delivered, with or without mention of antepartum condition 07/02/2011    Past Surgical History:  Procedure Laterality Date  . CESAREAN SECTION  07/02/2011   Procedure: CESAREAN SECTION;  Surgeon: Roseanna RainbowLisa A Jackson-Moore, MD;  Location: [facility];  Service: Gynecology;  Laterality: N/A;  . CHOLECYSTECTOMY      OB History    Gravida  4   Para  4   Term  4   Preterm      AB      Living  4     SAB      TAB      Ectopic      Multiple      Live Births  3            Home Medications    Prior to Admission medications   Medication Sig  Start Date End Date Taking? Authorizing Provider  cetirizine (ZYRTEC) 10 MG tablet Take 1 tablet (10 mg total) by mouth daily. Patient not taking: Reported on 01/11/2019 04/22/17   Hoy RegisterNewlin, Enobong, MD  hydrOXYzine (ATARAX/VISTARIL) 25 MG tablet Take 1 tablet (25 mg total) by mouth every 6 (six) hours as needed for anxiety. 01/11/19   Anders SimmondsMcClung, Angela M, PA-C  ibuprofen (ADVIL) 800 MG tablet Take 1 tablet (800 mg total) by mouth every 8 (eight) hours as needed. 01/11/19   Anders SimmondsMcClung, Angela M, PA-C  methimazole (TAPAZOLE) 10 MG tablet Take 1 tablet (10 mg total) by mouth 2 (two) times daily. 01/11/19   Anders SimmondsMcClung, Angela M, PA-C  methocarbamol (ROBAXIN) 500 MG tablet Take 2 tablets (1,000 mg total) by mouth every 8 (eight) hours as needed for muscle spasms. 01/11/19   Anders SimmondsMcClung, Angela M, PA-C  olopatadine (PATANOL) 0.1 % ophthalmic solution Place 1 drop into both eyes 2 (two) times daily. Patient not taking: Reported on 01/11/2019 12/29/16   Hoy RegisterNewlin, Enobong, MD    Family History Family History  Problem Relation Age of Onset  . Diabetes Mother   . Thyroid disease Paternal Grandmother   . Thyroid disease Sister   . Thyroid disease  Brother     Social History Social History   Tobacco Use  . Smoking status: Current Some Day Smoker    Packs/day: 0.25    Years: 1.00    Pack years: 0.25    Types: Cigarettes  . Smokeless tobacco: Former Network engineer Use Topics  . Alcohol use: No  . Drug use: No     Allergies   Patient has no known allergies.   Review of Systems Review of Systems Pertinent negatives listed in HPI Physical Exam Triage Vital Signs ED Triage Vitals  Enc Vitals Group     BP 01/13/19 1646 129/78     Pulse Rate 01/13/19 1646 89     Resp 01/13/19 1646 16     Temp 01/13/19 1646 99 F (37.2 C)     Temp src --      SpO2 01/13/19 1646 100 %     Weight 01/13/19 1645 145 lb (65.8 kg)     Height --      Head Circumference --      Peak Flow --      Pain Score 01/13/19 1645 6      Pain Loc --      Pain Edu? --      Excl. in [place]? --    No data found.  Updated Vital Signs BP 129/78 (BP Location: Right Arm)   Pulse 89   Temp 99 F (37.2 C)   Resp 16   Wt 145 lb (65.8 kg)   SpO2 100%   BMI 26.52 kg/m   Visual Acuity Right Eye Distance:   Left Eye Distance:   Bilateral Distance:    Right Eye Near:   Left Eye Near:    Bilateral Near:     Physical Exam General appearance: alert, well developed, well nourished, cooperative and in no distress Head: Normocephalic, without obvious abnormality, atraumatic Neck: No palpable tenderness. Normal ROM Respiratory: Respirations even and unlabored, normal respiratory rate Heart: rate and rhythm normal. No gallop or murmurs noted on exam  Extremities: No gross deformities Skin: Skin color, texture, turgor normal. No rashes seen  Psych: Anxious  Neurologic: Alert, oriented to person, place, and time, thought content appropriate.  UC Treatments / Results  Labs (all labs ordered are listed, but only abnormal results are displayed) Labs Reviewed - No data to display  EKG None  Radiology No results found.  Procedures Procedures (including critical care time)  Medications Ordered in UC Medications - No data to display  Initial Impression / Assessment and Plan / UC Course  I have reviewed the triage vital signs and the nursing notes.  Pertinent labs & imaging results that were available during my care of the patient were reviewed by me and considered in my medical decision making (see chart for details).   Patient presents with neck pain that has been ongoing since 12/28/18. She was seen by her PCP on 01/11/19 and prescribed Methocarbamol and has achieved some relief. Explained the benefit of adding an antiinflammatory and performing ROM exercises. If no approved, recommended follow-up with PCP. Patient verbalized understanding and agreement with plan. Final Clinical Impressions(s) / UC Diagnoses   Final  diagnoses:  Neck pain     Discharge Instructions     Continue methocarbamol.  You were given a Toradol injection today to help reduce inflammation.  You will start meloxicam as needed once daily tomorrow.  If symptoms continue to be present without significant improvement please follow-up with primary care to be  referred to an orthopedic specialist.    ED Prescriptions    Medication Sig Dispense Auth. Provider   meloxicam (MOBIC) 15 MG tablet Take 1 tablet (15 mg total) by mouth daily. 30 tablet Bing NeighborsHarris, Pedro Oldenburg S, FNP     Controlled Substance Prescriptions [place] Controlled Substance Registry consulted? Not Applicable   Bing NeighborsHarris, Prakriti Carignan S, FNP 01/13/19 2130

## 2019-01-13 NOTE — ED Triage Notes (Signed)
Pt states she has neck pain and she's taking a medicine for it and it's not working.

## 2019-01-18 ENCOUNTER — Encounter (HOSPITAL_COMMUNITY): Payer: Self-pay | Admitting: *Deleted

## 2019-01-18 ENCOUNTER — Emergency Department (HOSPITAL_COMMUNITY)
Admission: EM | Admit: 2019-01-18 | Discharge: 2019-01-19 | Disposition: A | Discharge status: Home / Self Care | Payer: Self-pay | Attending: Emergency Medicine | Admitting: Emergency Medicine

## 2019-01-18 ENCOUNTER — Other Ambulatory Visit: Payer: Self-pay

## 2019-01-18 DIAGNOSIS — F17210 Nicotine dependence, cigarettes, uncomplicated: Secondary | ICD-10-CM | POA: Insufficient documentation

## 2019-01-18 DIAGNOSIS — Z79899 Other long term (current) drug therapy: Secondary | ICD-10-CM | POA: Insufficient documentation

## 2019-01-18 DIAGNOSIS — M542 Cervicalgia: Secondary | ICD-10-CM | POA: Insufficient documentation

## 2019-01-18 DIAGNOSIS — R519 Headache, unspecified: Secondary | ICD-10-CM

## 2019-01-18 DIAGNOSIS — I10 Essential (primary) hypertension: Secondary | ICD-10-CM | POA: Insufficient documentation

## 2019-01-18 NOTE — ED Triage Notes (Signed)
Pt says she has been having neck pain (anterior and posterior) for a while.  She says she has also been having a headache and feels tired. Denies fevers. Pt has been seen several times without the same, says she has been taking her robaxin and ibuprofen without relief.

## 2019-01-19 ENCOUNTER — Emergency Department (HOSPITAL_COMMUNITY): Payer: Self-pay

## 2019-01-19 LAB — CBC
HCT: 41.9 % (ref 36.0–46.0)
Hemoglobin: 15.2 g/dL — ABNORMAL HIGH (ref 12.0–15.0)
MCH: 30.9 pg (ref 26.0–34.0)
MCHC: 36.3 g/dL — ABNORMAL HIGH (ref 30.0–36.0)
MCV: 85.2 fL (ref 80.0–100.0)
Platelets: 179 10*3/uL (ref 150–400)
RBC: 4.92 MIL/uL (ref 3.87–5.11)
RDW: 11.5 % (ref 11.5–15.5)
WBC: 8.4 10*3/uL (ref 4.0–10.5)
nRBC: 0 % (ref 0.0–0.2)

## 2019-01-19 LAB — BASIC METABOLIC PANEL
Anion gap: 6 (ref 5–15)
BUN: 8 mg/dL (ref 6–20)
CO2: 19 mmol/L — ABNORMAL LOW (ref 22–32)
Calcium: 8.8 mg/dL — ABNORMAL LOW (ref 8.9–10.3)
Chloride: 112 mmol/L — ABNORMAL HIGH (ref 98–111)
Creatinine, Ser: 0.47 mg/dL (ref 0.44–1.00)
GFR calc Af Amer: >60 mL/min (ref >60)
GFR calc non Af Amer: >60 mL/min (ref >60)
Glucose, Bld: 106 mg/dL — ABNORMAL HIGH (ref 70–99)
Potassium: 3.9 mmol/L (ref 3.5–5.1)
Sodium: 137 mmol/L (ref 135–145)

## 2019-01-19 MED ORDER — DEXAMETHASONE SODIUM PHOSPHATE 10 MG/ML IJ SOLN
10.0000 mg | Freq: Once | INTRAMUSCULAR | Status: AC
  Administered 2019-01-19: 02:00:00 10 mg via INTRAVENOUS
  Filled 2019-01-19: qty 1

## 2019-01-19 MED ORDER — METOCLOPRAMIDE HCL 5 MG/ML IJ SOLN
10.0000 mg | INTRAMUSCULAR | Status: AC
  Administered 2019-01-19: 02:00:00 10 mg via INTRAVENOUS
  Filled 2019-01-19: qty 2

## 2019-01-19 MED ORDER — KETOROLAC TROMETHAMINE 30 MG/ML IJ SOLN
15.0000 mg | Freq: Once | INTRAMUSCULAR | Status: AC
  Administered 2019-01-19: 15 mg via INTRAVENOUS
  Filled 2019-01-19: qty 1

## 2019-01-19 MED ORDER — IOHEXOL 350 MG/ML SOLN
100.0000 mL | Freq: Once | INTRAVENOUS | Status: AC | PRN
  Administered 2019-01-19: 03:00:00 100 mL via INTRAVENOUS

## 2019-01-19 MED ORDER — SODIUM CHLORIDE 0.9 % IV BOLUS
500.0000 mL | Freq: Once | INTRAVENOUS | Status: AC
  Administered 2019-01-19: 03:00:00 500 mL via INTRAVENOUS

## 2019-01-19 NOTE — ED Provider Notes (Signed)
Patient seen/examined in the Emergency Department in conjunction with Advanced Practice Provider [facility] Patient reports increasing neck pain that radiates into headache Exam : awake/alert, no arm/leg drift, no facial droop Plan: due to worsening symptoms, multiple health care evaluations and persistent neck pain/HA, will proceed with Ct imaging      Ripley Fraise, MD 01/19/19 [phone]

## 2019-01-19 NOTE — ED Notes (Signed)
Pt stating she has had a headache for about 3 weeks now. States she is seeing "flashes of light" occasionally.

## 2019-01-19 NOTE — Discharge Instructions (Addendum)
Your work-up in the emergency department today was reassuring.  The swelling seen on your previous neck CT was not noted on your imaging today.  We recommend that you continue your prescribed medications and follow-up with your doctor.  Inquire about your previous referrals.  You may return to the ED for any new or concerning symptoms.

## 2019-01-19 NOTE — ED Notes (Signed)
Patient verbalizes understanding of discharge instructions. Opportunity for questioning and answers were provided. Armband removed by staff, pt discharged from ED ambulatory with husband to pick up

## 2019-01-19 NOTE — ED Provider Notes (Signed)
[facility] EMERGENCY DEPARTMENT Provider Note   CSN: [id number] Arrival date & time: 01/18/19  2222    History   Chief Complaint Chief Complaint  Patient presents with   Headache    HPI Tracy Carson is a 38 y.o. female.     38 year old female presents to the emergency department for evaluation of neck pain.  States that neck pain is primarily along the right side, but will also be present to her anterior neck at times.  Pain radiates up to the right side of her head.  Denies any known modifying factors of her symptoms.  Pain is waxing and waning in severity.  Has been unrelieved with ibuprofen, sometimes mildly improved with Mobic.  She tried Robaxin on one occasion, but this made her nauseous so she discontinued it.  Will have tinnitus in her right ear at times with her pain.  Also endorsing some sporadic bilateral blurry vision. No fevers, hearing or vision loss, sore throat, neck stiffness, extremity numbness or paresthesias, extremity weakness.  Has been seen on 4 previous occasions for these complaints since the beginning of this month.  The history is provided by the patient. No language interpreter was used.  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   Tachycardia 11/03/2012   Hematochezia 11/03/2012   Graves disease 11/03/2012   Anemia 11/03/2012   Breech presentation 07/02/2011   Cesarean delivery, without mention of indication, delivered, with or without mention of antepartum condition 07/02/2011    Past Surgical History:  Procedure Laterality Date    CESAREAN SECTION  07/02/2011   Procedure: CESAREAN SECTION;  Surgeon: Roseanna Rainbow, MD;  Location: [facility];  Service: Gynecology;  Laterality: N/A;   CHOLECYSTECTOMY       OB History    Gravida  4   Para  4   Term  4   Preterm      AB      Living  4     SAB      TAB      Ectopic      Multiple      Live Births  3            Home Medications    Prior to Admission medications   Medication Sig Start Date End Date Taking? Authorizing Provider  hydrOXYzine (ATARAX/VISTARIL) 25 MG tablet Take 1 tablet (25 mg total) by mouth every 6 (six) hours as needed for anxiety. 01/11/19  Yes Anders Simmonds, PA-C  ibuprofen (ADVIL) 800 MG tablet Take 1 tablet (800 mg total) by mouth every 8 (eight) hours as needed. 01/11/19  Yes McClung, Marzella Schlein, PA-C  meloxicam (MOBIC) 15 MG tablet Take 1 tablet (15 mg total) by mouth daily. 01/13/19  Yes Bing Neighbors, FNP  methimazole (TAPAZOLE) 10 MG tablet Take 1 tablet (10 mg total) by mouth 2 (two) times daily. 01/11/19  Yes McClung, Marzella Schlein, PA-C  methocarbamol (ROBAXIN) 500 MG tablet Take 2 tablets (1,000 mg total) by mouth every 8 (eight) hours as needed for muscle spasms.  01/11/19  Yes Anders SimmondsMcClung, Angela M, PA-C  cetirizine (ZYRTEC) 10 MG tablet Take 1 tablet (10 mg total) by mouth daily. Patient not taking: Reported on 01/11/2019 04/22/17   Hoy RegisterNewlin, Enobong, MD  olopatadine (PATANOL) 0.1 % ophthalmic solution Place 1 drop into both eyes 2 (two) times daily. Patient not taking: Reported on 01/11/2019 12/29/16   Hoy RegisterNewlin, Enobong, MD    Family History Family History  Problem Relation Age of Onset   Diabetes Mother    Thyroid disease Paternal Grandmother    Thyroid disease Sister    Thyroid disease Brother     Social History Social History   Tobacco Use   Smoking status: Current Some Day Smoker    Packs/day: 0.25    Years: 1.00    Pack years: 0.25    Types: Cigarettes   Smokeless tobacco: Former NeurosurgeonUser  Substance Use  Topics   Alcohol use: No   Drug use: No     Allergies   Patient has no known allergies.   Review of Systems Review of Systems  Neurological: Positive for headaches.  Ten systems reviewed and are negative for acute change, except as noted in the HPI.    Physical Exam Updated Vital Signs BP 115/69    Pulse 81    Temp 98 F (36.7 C) (Oral)    Resp 16    SpO2 97%   Physical Exam Vitals signs and nursing note reviewed.  Constitutional:      General: She is not in acute distress.    Appearance: She is well-developed. She is not diaphoretic.     Comments: Nontoxic appearing and in NAD  HENT:     Head: Normocephalic and atraumatic.     Comments: No notable facial swelling.    Mouth/Throat:     Comments: Oropharynx clear.  Tolerating secretions without difficulty.  Normal tongue protrusion.  Uvula midline.  Oral floor is soft. Eyes:     General: No scleral icterus.    Conjunctiva/sclera: Conjunctivae normal.  Neck:     Musculoskeletal: Normal range of motion.     Comments: No nuchal rigidity or meningismus.  Significant adenopathy or palpable masses. Pulmonary:     Effort: Pulmonary effort is normal. No respiratory distress.     Comments: Respirations even and unlabored Musculoskeletal: Normal range of motion.  Skin:    General: Skin is warm and dry.     Coloration: Skin is not pale.     Findings: No erythema or rash.  Neurological:     General: No focal deficit present.     Mental Status: She is alert and oriented to person, place, and time.     Coordination: Coordination normal.     Comments: GCS 15. Speech is goal oriented. No cranial nerve deficits appreciated; symmetric eyebrow raise, no facial drooping, tongue midline. Patient has equal grip strength bilaterally with 5/5 strength against resistance in all major muscle groups bilaterally. Sensation to light touch intact. Patient moves extremities without ataxia.   Psychiatric:        Behavior: Behavior normal.       ED Treatments / Results  Labs (all labs ordered are listed, but only abnormal results are displayed) Labs Reviewed  CBC - Abnormal; Notable for the following components:      Result Value   Hemoglobin 15.2 (*)    MCHC 36.3 (*)    All other components within normal limits  BASIC METABOLIC PANEL - Abnormal; Notable for the following components:   Chloride 112 (*)  CO2 19 (*)    Glucose, Bld 106 (*)    Calcium 8.8 (*)    All other components within normal limits    EKG None  Radiology Ct Angio Head W Or Wo Contrast  Result Date: 01/19/2019 CLINICAL DATA:  Neck pain and headache EXAM: CT ANGIOGRAPHY HEAD AND NECK TECHNIQUE: Multidetector CT imaging of the head and neck was performed using the standard protocol during bolus administration of intravenous contrast. Multiplanar CT image reconstructions and MIPs were obtained to evaluate the vascular anatomy. Carotid stenosis measurements (when applicable) are obtained utilizing NASCET criteria, using the distal internal carotid diameter as the denominator. CONTRAST:  100mL OMNIPAQUE IOHEXOL 350 MG/ML SOLN COMPARISON:  None. FINDINGS: CT HEAD FINDINGS Brain: There is no mass, hemorrhage or extra-axial collection. The size and configuration of the ventricles and extra-axial CSF spaces are normal. There is no acute or chronic infarction. The brain parenchyma is normal. Skull: The visualized skull base, calvarium and extracranial soft tissues are normal. Sinuses/Orbits: No fluid levels or advanced mucosal thickening of the visualized paranasal sinuses. No mastoid or middle ear effusion. The orbits are normal. CTA NECK FINDINGS SKELETON: There is no bony spinal canal stenosis. No lytic or blastic lesion. OTHER NECK: Normal pharynx, larynx and major salivary glands. No cervical lymphadenopathy. Unremarkable thyroid gland. UPPER CHEST: No pneumothorax or pleural effusion. No nodules or masses. AORTIC ARCH: There is no calcific atherosclerosis of  the aortic arch. There is no aneurysm, dissection or hemodynamically significant stenosis of the visualized ascending aorta and aortic arch. Conventional 3 vessel aortic branching pattern. The visualized proximal subclavian arteries are widely patent. RIGHT CAROTID SYSTEM: --Common carotid artery: Widely patent origin without common carotid artery dissection or aneurysm. --Internal carotid artery: Normal without aneurysm, dissection or stenosis. --External carotid artery: No acute abnormality. LEFT CAROTID SYSTEM: --Common carotid artery: Widely patent origin without common carotid artery dissection or aneurysm. --Internal carotid artery: Normal without aneurysm, dissection or stenosis. --External carotid artery: No acute abnormality. VERTEBRAL ARTERIES: Left dominant configuration. Both origins are clearly patent. No dissection, occlusion or flow-limiting stenosis to the skull base (V1-V3 segments). CTA HEAD FINDINGS POSTERIOR CIRCULATION: --Vertebral arteries: Normal V4 segments. --Posterior inferior cerebellar arteries (PICA): Patent origins from the vertebral arteries. --Anterior inferior cerebellar arteries (AICA): Patent origins from the basilar artery. --Basilar artery: Normal. --Superior cerebellar arteries: Normal. --Posterior cerebral arteries (PCA): Normal. The right PCA is partially supplied by a posterior communicating artery (p-comm). ANTERIOR CIRCULATION: --Intracranial internal carotid arteries: Normal. --Anterior cerebral arteries (ACA): Normal. Both A1 segments are present. Patent anterior communicating artery (a-comm). --Middle cerebral arteries (MCA): Normal. VENOUS SINUSES: As permitted by contrast timing, patent. ANATOMIC VARIANTS: None Review of the MIP images confirms the above findings. IMPRESSION: Normal CTA of the head and neck. Electronically Signed   By: Deatra RobinsonKevin  Herman M.D.   On: 01/19/2019 03:38   Ct Angio Neck W And/or Wo Contrast  Result Date: 01/19/2019 CLINICAL DATA:  Neck pain  and headache EXAM: CT ANGIOGRAPHY HEAD AND NECK TECHNIQUE: Multidetector CT imaging of the head and neck was performed using the standard protocol during bolus administration of intravenous contrast. Multiplanar CT image reconstructions and MIPs were obtained to evaluate the vascular anatomy. Carotid stenosis measurements (when applicable) are obtained utilizing NASCET criteria, using the distal internal carotid diameter as the denominator. CONTRAST:  100mL OMNIPAQUE IOHEXOL 350 MG/ML SOLN COMPARISON:  None. FINDINGS: CT HEAD FINDINGS Brain: There is no mass, hemorrhage or extra-axial collection. The size and configuration of the ventricles and extra-axial  CSF spaces are normal. There is no acute or chronic infarction. The brain parenchyma is normal. Skull: The visualized skull base, calvarium and extracranial soft tissues are normal. Sinuses/Orbits: No fluid levels or advanced mucosal thickening of the visualized paranasal sinuses. No mastoid or middle ear effusion. The orbits are normal. CTA NECK FINDINGS SKELETON: There is no bony spinal canal stenosis. No lytic or blastic lesion. OTHER NECK: Normal pharynx, larynx and major salivary glands. No cervical lymphadenopathy. Unremarkable thyroid gland. UPPER CHEST: No pneumothorax or pleural effusion. No nodules or masses. AORTIC ARCH: There is no calcific atherosclerosis of the aortic arch. There is no aneurysm, dissection or hemodynamically significant stenosis of the visualized ascending aorta and aortic arch. Conventional 3 vessel aortic branching pattern. The visualized proximal subclavian arteries are widely patent. RIGHT CAROTID SYSTEM: --Common carotid artery: Widely patent origin without common carotid artery dissection or aneurysm. --Internal carotid artery: Normal without aneurysm, dissection or stenosis. --External carotid artery: No acute abnormality. LEFT CAROTID SYSTEM: --Common carotid artery: Widely patent origin without common carotid artery  dissection or aneurysm. --Internal carotid artery: Normal without aneurysm, dissection or stenosis. --External carotid artery: No acute abnormality. VERTEBRAL ARTERIES: Left dominant configuration. Both origins are clearly patent. No dissection, occlusion or flow-limiting stenosis to the skull base (V1-V3 segments). CTA HEAD FINDINGS POSTERIOR CIRCULATION: --Vertebral arteries: Normal V4 segments. --Posterior inferior cerebellar arteries (PICA): Patent origins from the vertebral arteries. --Anterior inferior cerebellar arteries (AICA): Patent origins from the basilar artery. --Basilar artery: Normal. --Superior cerebellar arteries: Normal. --Posterior cerebral arteries (PCA): Normal. The right PCA is partially supplied by a posterior communicating artery (p-comm). ANTERIOR CIRCULATION: --Intracranial internal carotid arteries: Normal. --Anterior cerebral arteries (ACA): Normal. Both A1 segments are present. Patent anterior communicating artery (a-comm). --Middle cerebral arteries (MCA): Normal. VENOUS SINUSES: As permitted by contrast timing, patent. ANATOMIC VARIANTS: None Review of the MIP images confirms the above findings. IMPRESSION: Normal CTA of the head and neck. Electronically Signed   By: Ulyses Jarred M.D.   On: 01/19/2019 03:38    Procedures Procedures (including critical care time)  Medications Ordered in ED Medications  dexamethasone (DECADRON) injection 10 mg (10 mg Intravenous Given 01/19/19 0159)  metoCLOPramide (REGLAN) injection 10 mg (10 mg Intravenous Given 01/19/19 0201)  ketorolac (TORADOL) 30 MG/ML injection 15 mg (15 mg Intravenous Given 01/19/19 0153)  sodium chloride 0.9 % bolus 500 mL (500 mLs Intravenous New Bag/Given 01/19/19 0304)  iohexol (OMNIPAQUE) 350 MG/ML injection 100 mL (100 mLs Intravenous Contrast Given 01/19/19 0309)     Initial Impression / Assessment and Plan / ED Course  I have reviewed the triage vital signs and the nursing notes.  Pertinent labs & imaging  results that were available during my care of the patient were reviewed by me and considered in my medical decision making (see chart for details).        38 year old female presents for persistent right-sided neck pain radiating up to cause right-sided headache.  Symptoms ongoing x3 weeks.  This is her fifth visit for these complaints.  Previously had a CT suggesting sialadenitis.  Repeat imaging performed today to ensure no dissection.  Previous swelling no longer appreciable on imaging.  CT is negative for any acute or emergent etiology.  Patient feeling better following a migraine cocktail.  I have encouraged her to continue follow-up with her primary doctor.  Return precautions discussed and provided. Patient discharged in stable condition with no unaddressed concerns.   Final Clinical Impressions(s) / ED Diagnoses   Final diagnoses:  Neck pain  Right-sided headache    ED Discharge Orders    None       Antony MaduraHumes, Monique Hefty, PA-C 01/19/19 0431    Zadie RhineWickline, Donald, MD 01/19/19 [phone]

## 2019-01-24 ENCOUNTER — Other Ambulatory Visit: Payer: Self-pay

## 2019-01-24 ENCOUNTER — Ambulatory Visit: Payer: Self-pay | Attending: Family Medicine

## 2019-01-29 ENCOUNTER — Encounter: Payer: Self-pay | Admitting: Family Medicine

## 2019-01-29 ENCOUNTER — Ambulatory Visit: Payer: Self-pay | Attending: Family Medicine | Admitting: Family Medicine

## 2019-01-29 VITALS — BP 112/71 | HR 77 | Temp 98.2°F | Wt 170.0 lb

## 2019-01-29 DIAGNOSIS — K1120 Sialoadenitis, unspecified: Secondary | ICD-10-CM

## 2019-01-29 DIAGNOSIS — E0590 Thyrotoxicosis, unspecified without thyrotoxic crisis or storm: Secondary | ICD-10-CM

## 2019-01-29 DIAGNOSIS — R635 Abnormal weight gain: Secondary | ICD-10-CM

## 2019-01-29 DIAGNOSIS — K0889 Other specified disorders of teeth and supporting structures: Secondary | ICD-10-CM

## 2019-01-29 MED ORDER — CLINDAMYCIN HCL 300 MG PO CAPS: 300.0000 mg | ORAL_CAPSULE | Freq: Two times a day (BID) | ORAL | 0 refills | Status: DC

## 2019-01-29 NOTE — Patient Instructions (Signed)
Recuento de caloras para bajar de peso Calorie Counting for Weight Loss Las caloras son unidades de energa. Su cuerpo necesita una cierta cantidad de caloras de los alimentos para que le ayuden a pasar el da. Cuando come ms caloras de las que el cuerpo necesita, este acumula las caloras extra como grasa. Cuando come menos caloras de las que el cuerpo necesita, este quema grasa para obtener la energa que requiere. El recuento de caloras es el registro de la cantidad de caloras que come y bebe cada da. El recuento de caloras puede ser de ayuda si necesita perder peso. Si se asegura de comer menos caloras de las que el cuerpo necesita, debe bajar de peso. Pregntele al mdico cul es un peso sano para usted. Para que el recuento de caloras funcione, tendr que comer la cantidad de caloras adecuadas para usted en un da, para bajar una cantidad de peso saludable por semana. Un nutricionista puede determinar la cantidad de caloras que necesita por da y sugerirle cmo alcanzar su objetivo calrico.  Una cantidad de peso saludable para bajar por semana suele ser entre 1 y 2libras (0,5 a 0,9kg). Esto significa con frecuencia que su ingesta diaria de caloras se debera reducir unas 500 a 750caloras.  Ingerir 1200 a 1500caloras por da puede ayudar a la mayora de las mujeres a perder peso.  Ingerir de 1500 a 1800caloras por da puede ayudar a la mayora de los hombres a perder peso. En qu consiste el plan? Mi objetivo es comer __________ caloras por da. Si como esta cantidad de caloras por da, debo bajar unas __________ libras por semana. Qu debo saber acerca del recuento de caloras? A fin de alcanzar su objetivo diario de caloras, tendr que:  Averiguar cuntas caloras hay en cada alimento que le gustara comer. Intente hacerlo antes de comer.  Decida la cantidad que puede comer del alimento.  Anote lo que comi y cuntas caloras tena. Esta tarea se conoce como  llevar un registro de comidas. Para perder peso con xito es importante equilibrar el recuento de caloras con un estilo de vida saludable que incluya actividad fsica de forma regular. Tenga un objetivo de 150minutos de ejercicio moderado (como caminar) o 75 minutos de ejercicio vigoroso (como correr) todas las semanas. Dnde encuentro informacin sobre las caloras?  Es posible encontrar la cantidad de caloras que contiene un alimento en la etiqueta de informacin nutricional. Si un alimento no tiene una etiqueta de informacin nutricional, intente buscar las caloras en Internet o pida ayuda al nutricionista. Recuerde que las caloras se calculan por porcin. Si opta por comer ms de una porcin de un alimento, tendr que multiplicar las caloras por porcin por la cantidad de porciones que planea comer. Por ejemplo, la etiqueta de un envase de pan puede decir que el tamao de una porcin es 1rodaja, y que una porcin tiene 90caloras. Si come 1rodaja, habr comido 90caloras. Si come 2rodajas, habr comido 180caloras. Cmo llevo un registro de comidas? Despus de cada comida, registre la siguiente informacin en el registro de comidas:  Lo que comi. No olvide incluir los aderezos, las salsas y otros extras de la comida.  La cantidad que comi. Esto se puede medir en tazas, onzas o cantidad de alimentos.  Cuntas caloras ingiri por comida y por bebida.  La cantidad total de caloras en la comida. Tenga a mano el registro de comidas, por ejemplo, en un anotador de bolsillo o utilice una aplicacin mvil o sitio web. Algunos programas   calcularn las caloras y le mostrarn la cantidad de caloras que le quedan para llegar al objetivo diario. Cules son algunos consejos para el recuento de caloras?   Use las caloras de los alimentos y las bebidas que lo sacien y no lo dejen con apetito: ? Algunos ejemplos de alimentos que lo sacian son los frutos secos y mantequillas de frutos  secos, verduras, protenas magras y alimentos con alto contenido de fibra como los cereales integrales. Los alimentos con alto contenido de fibra son aquellos que tienen ms de 5g de fibra por porcin. ? Las bebidas como los refrescos, especialmente las bebidas a base de caf y los jugos, que contienen muchas caloras, pero no le dan saciedad.  Coma alimentos nutritivos y evite las caloras vacas. Las caloras vacas son aquellas que se obtienen de los alimentos o las bebidas que no contienen muchos nutrientes ni protenas, como los dulces y los refrescos. Es mejor comer una comida nutritiva altamente calrica (como un aguacate) que una con pocos nutrientes (como una bolsa de patatas fritas).  Sepa cuntas caloras tienen los alimentos que come con ms frecuencia. Esto le ayudar a contar las caloras ms rpidamente.  Preste atencin a las caloras en las bebidas. Las bebidas de bajas caloras incluyen agua y refrescos sin azcar.  Preste atencin a las etiquetas nutricionales de alimentos "bajos en grasas" o "sin grasas". Estos alimentos a veces tienen la misma cantidad de caloras o ms caloras que las versiones ricas en grasa. Con frecuencia, tambin tienen agregados de azcar, almidn o sal, para darles el sabor que fue eliminado con la grasa.  Encuentre un mtodo para controlar las caloras que funcione para usted. Sea creativo. Pruebe aplicaciones o programas distintos, si llevar un registro de las caloras no funciona para usted. Cules son algunos consejos para controlar las porciones?  Sepa cuntas caloras hay en una porcin. Esto lo ayudar a saber cuntas porciones de un alimento determinado puede comer.  Use una taza medidora para medir los tamaos de las porciones. Tambin puede intentar pesar las porciones en una balanza de cocina. Con el tiempo, podr hacer un clculo estimativo de los tamaos de las porciones de algunos alimentos.  Dedique tiempo a poner porciones de  diferentes alimentos en sus platos, tazones y tazas predilectos, a fin de saber cmo se ve una porcin.  Intente no comer directamente de una bolsa o una caja. Esto puede llevarlo a comer en exceso. Ponga la cantidad que le gustara comer en una taza o un plato, a fin de asegurarse de que est comiendo la porcin correcta.  Use platos, vasos y tazones ms pequeos para no comer en exceso.  Intente no realizar varias tareas al mismo tiempo (como mirar la TV o usar su computadora) mientras come. Si es la hora de comer, sintese a la mesa y disfrute de la comida. Esto lo ayudar a reconocer cundo est satisfecho. Tambin le ayudar a tomar conciencia de lo que est comiendo y de la cantidad. Cules son algunos consejos para seguir este plan? Lectura de las etiquetas de los alimentos  Controle el recuento de caloras en comparacin con el tamao de la porcin. El tamao de la porcin puede ser ms pequeo de lo que suele comer.  Verifique la fuente de las caloras. Asegrese de que la comida que ingiere tenga alto contenido de vitaminas y protenas y sea baja en grasas saturadas y grasas trans. De compras  Lea las etiquetas nutricionales cuando compre. Esto le ayudar a tomar decisiones   ms saludables antes de comprar una comida.  Haga una lista para el almacn y resptela. La coccin  Intente cocinar sus alimentos preferidos de una manera ms saludable. Por ejemplo, pruebe hornear en vez de frer.  Utilice productos lcteos descremados. Planificacin de los alimentos  Utilice ms frutas y verduras. La mitad de sus platos debe ser de frutas y verduras.  Incluya protenas magras como el pollo y el pescado. Cmo puedo hacer el recuento de caloras cuando como afuera?  Pida porciones ms pequeas.  Considere la posibilidad de compartir un plato principal y las guarniciones, en lugar de pedir su propio plato principal.  Si pide su propio plato principal, coma solo la mitad. Pida una caja  al comienzo de la comida y ponga all el resto del plato principal, para no sentir la tentacin de comerlo.  Si se detallan las caloras en el men, elija las opciones que contengan la menor cantidad.  Elija platos que incluyan verduras, frutas, cereales integrales, productos lcteos con bajo contenido de grasa y protenas magras.  Opte por los alimentos hervidos, asados, cocidos a la parrilla o al vapor. No coma alimentos que contengan mantequilla, estn empanados, fritos o que se sirvan con salsa a base de crema. Generalmente, los alimentos que se etiquetan como "crujientes" estn fritos, a menos que se indique lo contrario.  Elija el agua, la leche descremada, el t helado sin azcar u otras bebidas que no contengan azcares agregados. Si desea una bebida alcohlica, escoja una opcin con menos caloras como una copa de vino o una cerveza ligera.  Ordene los aderezos, las salsas y los jarabes aparte. Estos son, con frecuencia, de alto contenido en caloras, por lo que debe limitar la cantidad que ingiere.  Si desea una ensalada, elija una de hortalizas y pida carnes a la parrilla. Evite las guarniciones adicionales como el tocino, el queso o los alimentos fritos. Ordene el aderezo aparte o pida aceite de oliva y vinagre o limn para aderezar.  Haga un clculo estimativo de la cantidad de porciones que le sirven. Por ejemplo, una porcin de arroz cocido equivale a media taza o la mitad del tamao de una pelota de bisbol. Conocer el tamao de las porciones lo ayudar a estar atento a la cantidad de comida que come en los restaurantes. La lista que sigue le muestra el tamao de algunas porciones comunes a partir de objetos cotidianos: ? 1onza (28g) = 4dados apilados. ? 3onzas (85g) = 1mazo de cartas. ? 1cucharadita = 1dado. ? 1cucharada = media pelota de tenis de mesa. ? 2cucharadas = 1pelota de tenis de mesa. ? Media taza = media pelota de bisbol. ? 1taza = 1 pelota de bisbol.  Resumen  El recuento de caloras es el registro de la cantidad de caloras que come y bebe cada da. Si come menos caloras de las que el cuerpo necesita, debe bajar de peso.  Una cantidad de peso saludable para bajar por semana suele ser entre 1 y 2libras (0,5 a 0,9kg). Esto significa, con frecuencia, reducir su ingesta diaria de caloras unas 500 a 750 caloras.  Es posible encontrar la cantidad de caloras que contiene un alimento en la etiqueta de informacin nutricional. Si un alimento no tiene una etiqueta de informacin nutricional, intente buscar las caloras en Internet o pida ayuda al nutricionista.  Use las caloras de los alimentos y las bebidas que lo sacien y no de los alimentos y las bebidas que lo dejan con apetito.  Use platos, vasos y   tazones ms pequeos para no comer en exceso. Esta informacin no tiene como fin reemplazar el consejo del mdico. Asegrese de hacerle al mdico cualquier pregunta que tenga. Document Released: 10/28/2008 Document Revised: 10/11/2016 Document Reviewed: 10/11/2016 Elsevier Patient Education  2020 [employer]  

## 2019-01-29 NOTE — Progress Notes (Signed)
Subjective:  Patient ID: Tracy Carson, female    DOB: February 24, 1981  Age: 37 y.o. MRN: [id number]  CC: Headache and Facial Pain   HPI Tracy Carson is a 38 year old female with hyperthyroidism secondary to Graves' disease (treated with radioactive iodine in 01/2017) who presents today for follow-up visit. She complains of right facial swelling and weight gain for the last 5 weeks.  At the time she had an ED visit and was treated for sialoadenitis.  CT angiogram of the head and neck was normal. CT soft tissue of the neck revealed: IMPRESSION: Thickening of the right platysma muscle with subcutaneous induration of the lower right face, overlying the right submandibular gland. This may indicate submandibular sialadenitis with surrounding edema. No sialolithiasis. She was treated with ibuprofen and subsequently discharged.  She had a follow-up visit with the physician assistant where she had been referred to ENT and also referred to endocrine due to significantly suppressed TSH.  She informs me today ENT revealed no explainable cause of her facial swelling. She has no dysphagia or trouble swallowing, no ear symptoms and does have slight pain in her right temple.  Denies fever.  She endorses painful teeth and dental caries and is requesting a dental referral. She is concerned about weight gain as she informs me she has gained 24 pounds in the last 5 weeks.  She has not been compliant with Tapazole which was prescribed as she states it did not make her feel good.  On further questioning she has not been active due to the ongoing pandemic as she has had anxiety over going outside and was placed on hydroxyzine at her last visit.  Past Medical History:  Diagnosis Date  . Hypertension   . Thyroid disease   . Tuberculosis    as a child    Past Surgical History:  Procedure Laterality Date  . CESAREAN SECTION  07/02/2011   Procedure: CESAREAN SECTION;  Surgeon: Roseanna Rainbow, MD;   Location: [facility];  Service: Gynecology;  Laterality: N/A;  . CHOLECYSTECTOMY      Family History  Problem Relation Age of Onset  . Diabetes Mother   . Thyroid disease Paternal Grandmother   . Thyroid disease Sister   . Thyroid disease Brother     No Known Allergies  Outpatient Medications Prior to Visit  Medication Sig Dispense Refill  . hydrOXYzine (ATARAX/VISTARIL) 25 MG tablet Take 1 tablet (25 mg total) by mouth every 6 (six) hours as needed for anxiety. 60 tablet 0  . ibuprofen (ADVIL) 800 MG tablet Take 1 tablet (800 mg total) by mouth every 8 (eight) hours as needed. 60 tablet 0  . cetirizine (ZYRTEC) 10 MG tablet Take 1 tablet (10 mg total) by mouth daily. (Patient not taking: Reported on 01/11/2019) 30 tablet 1  . meloxicam (MOBIC) 15 MG tablet Take 1 tablet (15 mg total) by mouth daily. (Patient not taking: Reported on 01/29/2019) 30 tablet 1  . methimazole (TAPAZOLE) 10 MG tablet Take 1 tablet (10 mg total) by mouth 2 (two) times daily. (Patient not taking: Reported on 01/29/2019) 60 tablet 0  . methocarbamol (ROBAXIN) 500 MG tablet Take 2 tablets (1,000 mg total) by mouth every 8 (eight) hours as needed for muscle spasms. (Patient not taking: Reported on 01/29/2019) 90 tablet 0  . olopatadine (PATANOL) 0.1 % ophthalmic solution Place 1 drop into both eyes 2 (two) times daily. (Patient not taking: Reported on 01/11/2019) 5 mL 3   No facility-administered medications prior  to visit.      ROS Review of Systems  Constitutional: Negative for activity change, appetite change and fatigue.  HENT: Negative for congestion, sinus pressure and sore throat.   Eyes: Negative for visual disturbance.  Respiratory: Negative for cough, chest tightness, shortness of breath and wheezing.   Cardiovascular: Negative for chest pain and palpitations.  Gastrointestinal: Negative for abdominal distention, abdominal pain and constipation.  Endocrine: Negative for polydipsia.  Genitourinary: Negative  for dysuria and frequency.  Musculoskeletal: Negative for arthralgias and back pain.  Skin: Negative for rash.  Neurological: Negative for tremors, light-headedness and numbness.  Hematological: Does not bruise/bleed easily.  Psychiatric/Behavioral: Negative for agitation and behavioral problems.    Objective:  BP 112/71   Pulse 77   Temp 98.2 F (36.8 C) (Oral)   Wt 170 lb (77.1 kg)   SpO2 97%   BMI 31.09 kg/m   BP/Weight 01/29/2019 01/19/2019 01/13/2019  Systolic BP 112 111 129  Diastolic BP 71 73 78  Wt. (Lbs) 170 - 145  BMI 31.09 - 26.52      Physical Exam Constitutional:      Appearance: She is well-developed.  HENT:     Head:     Comments: Slight right mandibular edema, no TTP Cardiovascular:     Rate and Rhythm: Normal rate.     Heart sounds: Normal heart sounds. No murmur.  Pulmonary:     Effort: Pulmonary effort is normal.     Breath sounds: Normal breath sounds. No wheezing or rales.  Chest:     Chest wall: No tenderness.  Abdominal:     General: Bowel sounds are normal. There is no distension.     Palpations: Abdomen is soft. There is no mass.     Tenderness: There is no abdominal tenderness.  Musculoskeletal: Normal range of motion.  Lymphadenopathy:     Cervical: No cervical adenopathy.  Neurological:     Mental Status: She is alert and oriented to person, place, and time.     CMP Latest Ref Rng & Units 01/19/2019 01/06/2019 12/29/2018  Glucose 70 - 99 mg/dL 161(W) 960(A) 540(J)  BUN 6 - 20 mg/dL 8 6 9   Creatinine 0.44 - 1.00 mg/dL 8.11 9.14 7.82  Sodium 135 - 145 mmol/L 137 137 138  Potassium 3.5 - 5.1 mmol/L 3.9 3.8 4.1  Chloride 98 - 111 mmol/L 112(H) 109 109  CO2 22 - 32 mmol/L 19(L) 21(L) 21(L)  Calcium 8.9 - 10.3 mg/dL 9.5(A) 9.1 9.0  Total Protein 6.5 - 8.1 g/dL - 7.2 -  Total Bilirubin 0.3 - 1.2 mg/dL - 0.5 -  Alkaline Phos 38 - 126 U/L - 122 -  AST 15 - 41 U/L - 25 -  ALT 0 - 44 U/L - 27 -    Lipid Panel     Component Value  Date/Time   CHOL 112 11/15/2016 1038   TRIG 162 (H) 11/15/2016 1038   HDL 24 (L) 11/15/2016 1038   CHOLHDL 4.7 (H) 11/15/2016 1038   LDLCALC 56 11/15/2016 1038    CBC    Component Value Date/Time   WBC 8.4 01/19/2019 0139   RBC 4.92 01/19/2019 0139   HGB 15.2 (H) 01/19/2019 0139   HGB 13.3 11/15/2016 1038   HCT 41.9 01/19/2019 0139   HCT 38.1 11/15/2016 1038   PLT 179 01/19/2019 0139   PLT 185 11/15/2016 1038   MCV 85.2 01/19/2019 0139   MCV 81 11/15/2016 1038   MCH 30.9 01/19/2019 0139  MCHC 36.3 (H) 01/19/2019 0139   RDW 11.5 01/19/2019 0139   RDW 13.5 11/15/2016 1038   LYMPHSABS 1.8 01/06/2019 1308   LYMPHSABS 2.7 11/15/2016 1038   MONOABS 0.4 01/06/2019 1308   EOSABS 0.1 01/06/2019 1308   EOSABS 0.2 11/15/2016 1038   BASOSABS 0.0 01/06/2019 1308   BASOSABS 0.0 11/15/2016 1038    Lab Results  Component Value Date   HGBA1C 5.2 11/15/2016    Assessment & Plan:   1. Hyperthyroidism Significantly suppressed TSH Not compliant with Tapazole She has upcoming appointment with endocrine next week  2. Pain, dental - clindamycin (CLEOCIN) 300 MG capsule; Take 1 capsule (300 mg total) by mouth 2 (two) times a day.  Dispense: 20 capsule; Refill: 0 - Ambulatory referral to Dentistry  3. Sialadenitis Symptoms have been present for the last 5 weeks but she has been afebrile Advised to increase intake of lozenges and lemon-containing products to facilitate production of saliva Recently seen by ENT with no explainable etiology of right facial swelling - clindamycin (CLEOCIN) 300 MG capsule; Take 1 capsule (300 mg total) by mouth 2 (two) times a day.  Dispense: 20 capsule; Refill: 0  4. Weight gain Unfortunately besides the patient's report of weight gain I am unable to corroborate this as at her ED visit she did not have a weight check and at her last in person visit in 04/2017 she weighed 162 pounds which is a 8 pound weight gain compared to today This could be  attributable to being more sedentary as a result of the ongoing pandemic.  I have had a conversation with her about this and she is unaware that she has been more sedentary.  She will begin to work on daily exercise this by means of walking, yoga or physical fitness activities at home which I have explained to her can be obtained on YouTube on her phone.   Meds ordered this encounter  Medications  . clindamycin (CLEOCIN) 300 MG capsule    Sig: Take 1 capsule (300 mg total) by mouth 2 (two) times a day.    Dispense:  20 capsule    Refill:  0    Follow-up: Return in about 3 months (around 05/01/2019) for medical conditions.       [name], MD, FAAFP. [facility] [place], [place] [phone]   01/29/2019, 3:56 PM

## 2019-01-29 NOTE — Progress Notes (Signed)
Patient states that she has had facial swelling.  Patient states that she feels se is gaining weight.

## 2019-02-01 ENCOUNTER — Other Ambulatory Visit (INDEPENDENT_AMBULATORY_CARE_PROVIDER_SITE_OTHER): Payer: Self-pay

## 2019-02-01 ENCOUNTER — Other Ambulatory Visit: Payer: Self-pay | Admitting: Endocrinology

## 2019-02-01 ENCOUNTER — Other Ambulatory Visit: Payer: Self-pay

## 2019-02-01 DIAGNOSIS — E0590 Thyrotoxicosis, unspecified without thyrotoxic crisis or storm: Secondary | ICD-10-CM

## 2019-02-01 LAB — T4, FREE: Free T4: 1.3 ng/dL (ref 0.60–1.60)

## 2019-02-01 LAB — T3, FREE: T3, Free: 5.4 pg/mL — ABNORMAL HIGH (ref 2.3–4.2)

## 2019-02-05 ENCOUNTER — Ambulatory Visit (INDEPENDENT_AMBULATORY_CARE_PROVIDER_SITE_OTHER): Payer: Self-pay | Admitting: Endocrinology

## 2019-02-05 ENCOUNTER — Other Ambulatory Visit: Payer: Self-pay

## 2019-02-05 ENCOUNTER — Encounter: Payer: Self-pay | Admitting: Endocrinology

## 2019-02-05 VITALS — BP 128/70 | HR 93 | Temp 98.7°F | Resp 16 | Wt 169.4 lb

## 2019-02-05 DIAGNOSIS — E0590 Thyrotoxicosis, unspecified without thyrotoxic crisis or storm: Secondary | ICD-10-CM

## 2019-02-05 NOTE — Progress Notes (Signed)
Patient ID: Tracy Carson, female   DOB: March 02, 1981, 38 y.o.   MRN: [id number]                                                                                                               Reason for Appointment:  Hyperthyroidism, follow-up visit   Chief complaint: Overactive thyroid   History of Present Illness:   Prior history: Her hyperthyroidism was diagnosed in 2012 before a pregnancy At that time she had symptoms of palpitations, shakiness, feeling excessively warm, difficulty sleeping, increased appetite and hair loss She was treated with methimazole She was recommended I-131 treatment in 2014 but even though she had an uptake test done she did not go for the treatment Subsequently has been very irregular with her follow-up program and medications She says she was taking her methimazole 2 tablets twice a day every third day or so because of cost Because of worsening symptoms of palpitations, feeling excessively hot and losing weight she presented to the emergency room on 11/08/16 and was admitted for 2 days  On her initial consultation in her free T4 was still very high at 3.04 and she was having problems with fatigue, weakness and heat intolerance along with tachycardia. Her methimazole was increased to 3 tablets twice a day initially   She finally agreed to do that I-131 treatment when she was seen in June and this was done with 19.5 mCi on 02/16/17  RECENT history  In October 2018 she had normal thyroid functions and had no complaints  She was told to come back in about 2 months but she did not  She went to the emergency room in June 2020 and she thought she had swollen glands in her neck along with anxiety She was also feeling her heart racing She was concerned about her weight going up also  Since her thyroid levels showed hyperthyroidism and free T4 was significantly high at 2.3 she was started on methimazole 10 mg twice daily by her PCP on 01/11/2019  However with  this she started feeling tired and also having a headache She has not taken methimazole for about 2 weeks  Currently her only symptom is feeling anxious and apprehensive and also some depressed mood and crying She is concerned that she is gaining weight  Wt Readings from Last 3 Encounters:  02/05/19 169 lb 6.4 oz (76.8 kg)  01/29/19 170 lb (77.1 kg)  01/13/19 145 lb (65.8 kg)     Thyroid function tests as follows:     Lab Results  Component Value Date   FREET4 1.30 02/01/2019   FREET4 2.30 (H) 01/06/2019   FREET4 0.77 05/13/2017   T3FREE 5.4 (H) 02/01/2019   T3FREE 20.8 (HH) 11/15/2016   T3FREE 28.6 (H) 11/08/2016   TSH <0.010 (L) 01/06/2019   TSH <0.01 (L) 05/13/2017   TSH <0.01 (L) 04/18/2017    No results found for: THYROTRECAB   Allergies as of 02/05/2019   No Known Allergies     Medication List  Accurate as of February 05, 2019  4:08 PM. If you have any questions, ask your nurse or doctor.        cetirizine 10 MG tablet Commonly known as: ZYRTEC Take 1 tablet (10 mg total) by mouth daily.   clindamycin 300 MG capsule Commonly known as: Cleocin Take 1 capsule (300 mg total) by mouth 2 (two) times a day.   hydrOXYzine 25 MG tablet Commonly known as: ATARAX/VISTARIL Take 1 tablet (25 mg total) by mouth every 6 (six) hours as needed for anxiety.   ibuprofen 800 MG tablet Commonly known as: ADVIL Take 1 tablet (800 mg total) by mouth every 8 (eight) hours as needed.   meloxicam 15 MG tablet Commonly known as: MOBIC Take 1 tablet (15 mg total) by mouth daily.   methimazole 10 MG tablet Commonly known as: TAPAZOLE Take 1 tablet (10 mg total) by mouth 2 (two) times daily.   methocarbamol 500 MG tablet Commonly known as: Robaxin Take 2 tablets (1,000 mg total) by mouth every 8 (eight) hours as needed for muscle spasms.   olopatadine 0.1 % ophthalmic solution Commonly known as: Patanol Place 1 drop into both eyes 2 (two) times daily.            Past Medical History:  Diagnosis Date  . Hypertension   . Thyroid disease   . Tuberculosis    as a child    Past Surgical History:  Procedure Laterality Date  . CESAREAN SECTION  07/02/2011   Procedure: CESAREAN SECTION;  Surgeon: Roseanna Rainbow, MD;  Location: [facility];  Service: Gynecology;  Laterality: N/A;  . CHOLECYSTECTOMY      Family History  Problem Relation Age of Onset  . Diabetes Mother   . Thyroid disease Paternal Grandmother   . Thyroid disease Sister   . Thyroid disease Brother     Social History:  reports that she has been smoking cigarettes. She has a 0.25 pack-year smoking history. She has quit using smokeless tobacco. She reports that she does not drink alcohol or use drugs.  Allergies: No Known Allergies   Review of Systems     Examination:   BP 128/70   Pulse 93   Temp 98.7 F (37.1 C) (Oral)   Resp 16   Wt 169 lb 6.4 oz (76.8 kg)   SpO2 98%   BMI 30.98 kg/m   She is not hyperkinetic or anxious Mild prominence of the eyes  Neck: The thyroid is bilaterally palpable about one half times normal, relatively soft  Deep tendon reflexes at biceps are difficult to elicit No tremor    Assessment/Plan:   Graves' disease, treated with radioactive iodine in July 2018  However even though she had become euthyroid after the treatment and her goiter had resolved she appears to have a recurrence now  Her goiter is slightly palpable again  Since she was feeling sluggish on 20 mg methimazole a day and her recent labs only show mild increase in T3 without methimazole for about 10 days she can try the lowest dose of 5 mg for now Discussed possibility of doing the I-131 treatment again since the first 1 was not completely effective and she is unlikely to get into remission with methimazole She will think about it for now     Patient Instructions  1/2 Methimazole at bedtime    Reather Littler 02/05/2019, 4:08 PM    Note: This office note was  prepared with Dragon voice recognition system technology. Any transcriptional  errors that result from this process are unintentional.

## 2019-02-05 NOTE — Patient Instructions (Signed)
1/2 Methimazole at bedtime

## 2019-02-07 ENCOUNTER — Telehealth: Payer: Self-pay | Admitting: Family Medicine

## 2019-02-07 NOTE — Telephone Encounter (Signed)
Patient called states they are having severe headaches and is concerned on why she is having a headache. Patient would like to get a orthopedic referral as well. Please follow up.

## 2019-02-08 ENCOUNTER — Other Ambulatory Visit: Payer: Self-pay

## 2019-02-08 ENCOUNTER — Ambulatory Visit: Payer: Self-pay | Attending: Family Medicine | Admitting: Physician Assistant

## 2019-02-08 DIAGNOSIS — G44209 Tension-type headache, unspecified, not intractable: Secondary | ICD-10-CM

## 2019-02-08 DIAGNOSIS — E0500 Thyrotoxicosis with diffuse goiter without thyrotoxic crisis or storm: Secondary | ICD-10-CM

## 2019-02-08 MED ORDER — METHIMAZOLE 10 MG PO TABS: 5.0000 mg | ORAL_TABLET | Freq: Every day | ORAL | 6 refills | Status: DC

## 2019-02-08 MED ORDER — CYCLOBENZAPRINE HCL 5 MG PO TABS: ORAL_TABLET | ORAL | 1 refills | Status: DC
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
## 2019-02-08 NOTE — Telephone Encounter (Signed)
Patient is being seen by walkin provider.

## 2019-02-14 ENCOUNTER — Ambulatory Visit: Payer: No Typology Code available for payment source | Admitting: Family Medicine

## 2019-02-21 ENCOUNTER — Telehealth: Payer: Self-pay | Admitting: Family Medicine

## 2019-02-21 ENCOUNTER — Other Ambulatory Visit: Payer: Self-pay

## 2019-02-21 ENCOUNTER — Encounter (HOSPITAL_COMMUNITY): Payer: Self-pay | Admitting: Emergency Medicine

## 2019-02-21 ENCOUNTER — Emergency Department (HOSPITAL_COMMUNITY)
Admission: EM | Admit: 2019-02-21 | Discharge: 2019-02-21 | Disposition: A | Discharge status: Home / Self Care | Payer: Self-pay | Attending: Emergency Medicine | Admitting: Emergency Medicine

## 2019-02-21 DIAGNOSIS — I10 Essential (primary) hypertension: Secondary | ICD-10-CM | POA: Insufficient documentation

## 2019-02-21 DIAGNOSIS — F17290 Nicotine dependence, other tobacco product, uncomplicated: Secondary | ICD-10-CM | POA: Insufficient documentation

## 2019-02-21 DIAGNOSIS — R51 Headache: Secondary | ICD-10-CM | POA: Insufficient documentation

## 2019-02-21 DIAGNOSIS — G8929 Other chronic pain: Secondary | ICD-10-CM

## 2019-02-21 MED ORDER — OXYCODONE-ACETAMINOPHEN 5-325 MG PO TABS
2.0000 | ORAL_TABLET | Freq: Once | ORAL | Status: AC
  Administered 2019-02-21: 2 via ORAL
  Filled 2019-02-21: qty 2

## 2019-02-21 NOTE — Telephone Encounter (Signed)
Interpreter assistance provided byElita Quick, [place] to speak to patient regarding concern from previous call.   Left message on voicemail. No answer.

## 2019-02-21 NOTE — Telephone Encounter (Signed)
Pt called to request to speak with a nurse in regards to her recent sweating on only one side of her face, she states that this occurs when doing any type of activity around her home, she has a history of headaches and believes her situation has worsened, call forwarded to triage
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
## 2019-02-21 NOTE — Discharge Instructions (Addendum)
Please call neurology for follow up asap

## 2019-02-21 NOTE — Telephone Encounter (Addendum)
Per Pt-  "Whenever I took the medication for the headache or neck pain, the pain goes down but I have noticed that when I do activity my head sweats and only half of my head sweats and I do not feel well".   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   When asked about- slanting by eyes or mouth, Facial drooping- pt states she is  Unsure. She states it feels like my face is twitching.   Instructed to make an appointment for further evaluation with PCP. If pain worsen to go to ED. Call was transferred to schedule an appointment.   Due to language barrier, an interpreter was used.  Interpreter name or [employer] ID # [id number], Dawson Bills
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
## 2019-02-26 ENCOUNTER — Other Ambulatory Visit: Payer: Self-pay

## 2019-02-26 ENCOUNTER — Other Ambulatory Visit (INDEPENDENT_AMBULATORY_CARE_PROVIDER_SITE_OTHER): Payer: Self-pay

## 2019-02-26 DIAGNOSIS — E0590 Thyrotoxicosis, unspecified without thyrotoxic crisis or storm: Secondary | ICD-10-CM

## 2019-02-26 LAB — T3, FREE: T3, Free: 4.2 pg/mL (ref 2.3–4.2)

## 2019-02-26 LAB — T4, FREE: Free T4: 1.34 ng/dL (ref 0.60–1.60)
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
## 2019-02-28 ENCOUNTER — Other Ambulatory Visit: Payer: Self-pay

## 2019-02-28 ENCOUNTER — Ambulatory Visit: Payer: Self-pay | Attending: Family Medicine | Admitting: Physician Assistant

## 2019-02-28 DIAGNOSIS — G44209 Tension-type headache, unspecified, not intractable: Secondary | ICD-10-CM

## 2019-02-28 DIAGNOSIS — Z09 Encounter for follow-up examination after completed treatment for conditions other than malignant neoplasm: Secondary | ICD-10-CM

## 2019-02-28 DIAGNOSIS — M62838 Other muscle spasm: Secondary | ICD-10-CM

## 2019-02-28 DIAGNOSIS — F419 Anxiety disorder, unspecified: Secondary | ICD-10-CM

## 2019-02-28 MED ORDER — IBUPROFEN 800 MG PO TABS: 800.0000 mg | ORAL_TABLET | Freq: Three times a day (TID) | ORAL | 0 refills | Status: DC | PRN

## 2019-02-28 MED ORDER — CYCLOBENZAPRINE HCL 5 MG PO TABS: ORAL_TABLET | ORAL | 1 refills | Status: DC

## 2019-02-28 MED ORDER — HYDROXYZINE HCL 25 MG PO TABS: 25.0000 mg | ORAL_TABLET | Freq: Four times a day (QID) | ORAL | 0 refills | Status: DC | PRN

## 2019-03-01 ENCOUNTER — Other Ambulatory Visit: Payer: Self-pay

## 2019-03-01 ENCOUNTER — Ambulatory Visit: Payer: Self-pay | Admitting: Endocrinology

## 2019-03-01 ENCOUNTER — Encounter: Payer: Self-pay | Admitting: Endocrinology

## 2019-03-01 VITALS — BP 140/72 | HR 89 | Ht 62.0 in | Wt 173.0 lb

## 2019-03-01 DIAGNOSIS — E0590 Thyrotoxicosis, unspecified without thyrotoxic crisis or storm: Secondary | ICD-10-CM

## 2019-03-01 NOTE — Progress Notes (Signed)
Patient ID: Tracy Carson, female   DOB: 05/05/1981, 38 y.o.   MRN: [id number]                                                                                                               Reason for Appointment:  Hyperthyroidism, follow-up visit   Chief complaint: Overactive thyroid   History of Present Illness:   Prior history: Her hyperthyroidism was diagnosed in 2012 before a pregnancy At that time she had symptoms of palpitations, shakiness, feeling excessively warm, difficulty sleeping, increased appetite and hair loss She was treated with methimazole She was recommended I-131 treatment in 2014 but even though she had an uptake test done she did not go for the treatment Subsequently has been very irregular with her follow-up program and medications She says she was taking her methimazole 2 tablets twice a day every third day or so because of cost Because of worsening symptoms of palpitations, feeling excessively hot and losing weight she presented to the emergency room on 11/08/16 and was admitted for 2 days  On her initial consultation in her free T4 was still very high at 3.04 and she was having problems with fatigue, weakness and heat intolerance along with tachycardia. Her methimazole was increased to 3 tablets twice a day initially   She finally agreed to do that I-131 treatment when she was seen in June and this was done with 19.5 mCi on 02/16/17  RECENT history  In October 2018 she had normal thyroid functions and had no complaints  She was told to come back in about 2 months but she did not  She went to the emergency room in June 2020 and she thought she had swollen glands in her neck along with anxiety She was also feeling her heart racing  Since her thyroid levels showed hyperthyroidism and free T4 was significantly high at 2.3 she was started on methimazole 10 mg twice daily by her PCP on 01/11/2019  However with this she started feeling tired and stopped her  methimazole 2 weeks prior to her follow-up At that point her free T4 was normal but free T3 was high at 5.4  She was told to start 5 mg of methimazole and she is using 1/2 tablet of the 10 mg daily She does not feel any fatigue now but does feel a little shaky at times, excessively warm and when she is upset has rapid heart beat  Her free T3 is now normal and free T4 is about the same  She is concerned that she is gaining weight  Wt Readings from Last 3 Encounters:  03/01/19 173 lb (78.5 kg)  02/05/19 169 lb 6.4 oz (76.8 kg)  01/29/19 170 lb (77.1 kg)     Thyroid function tests as follows:     Lab Results  Component Value Date   FREET4 1.34 02/26/2019   FREET4 1.30 02/01/2019   FREET4 2.30 (H) 01/06/2019   T3FREE 4.2 02/26/2019   T3FREE 5.4 (H) 02/01/2019   T3FREE 20.8 (HH) 11/15/2016  TSH <0.010 (L) 01/06/2019   TSH <0.01 (L) 05/13/2017   TSH <0.01 (L) 04/18/2017    No results found for: THYROTRECAB   Allergies as of 03/01/2019   No Known Allergies     Medication List       Accurate as of March 01, 2019  3:26 PM. If you have any questions, ask your nurse or doctor.        STOP taking these medications   cetirizine 10 MG tablet Commonly known as: ZYRTEC Stopped by: Reather Littler, MD     TAKE these medications   cyclobenzaprine 5 MG tablet Commonly known as: FLEXERIL 1/2-1 tid prn muscle spasm   hydrOXYzine 25 MG tablet Commonly known as: ATARAX/VISTARIL Take 1 tablet (25 mg total) by mouth every 6 (six) hours as needed for anxiety.   ibuprofen 800 MG tablet Commonly known as: ADVIL Take 1 tablet (800 mg total) by mouth every 8 (eight) hours as needed.   methimazole 10 MG tablet Commonly known as: TAPAZOLE Take 0.5 tablets (5 mg total) by mouth daily.           Past Medical History:  Diagnosis Date  . Hypertension   . Thyroid disease   . Tuberculosis    as a child    Past Surgical History:  Procedure Laterality Date  . CESAREAN SECTION   07/02/2011   Procedure: CESAREAN SECTION;  Surgeon: Roseanna Rainbow, MD;  Location: [facility];  Service: Gynecology;  Laterality: N/A;  . CHOLECYSTECTOMY      Family History  Problem Relation Age of Onset  . Diabetes Mother   . Thyroid disease Paternal Grandmother   . Thyroid disease Sister   . Thyroid disease Brother     Social History:  reports that she has been smoking cigars. She has a 5.00 pack-year smoking history. She has quit using smokeless tobacco. She reports that she does not drink alcohol or use drugs.  Allergies: No Known Allergies   Review of Systems  She is asking about her side of her head and neck area hurting, was seen by PCP yesterday   Examination:   BP 140/72 (BP Location: Left Arm, Patient Position: Sitting, Cuff Size: Normal)   Pulse 89   Ht 5\' 2"  (1.575 m)   Wt 173 lb (78.5 kg)   SpO2 98%   BMI 31.64 kg/m   She is not unusually anxious  Mild prominence of the eyes or swelling of the face  Thyroid 1-1/2 times normal bilaterally  Skin appears normal  No tremor    Assessment/Plan:   Graves' disease, treated with radioactive iodine in July 2018  She is having a recurrence of her hyperthyroidism Currently with 5 mg of methimazole she is subjectively doing better although still having mild symptoms especially a little shakiness, heat intolerance and occasional palpitations Free T4 and free T3 are upper normal She wants to wait a couple of months before doing I-131 treatment In the meantime she will alternate 10 mg with 5 mg of methimazole     There are no Patient Instructions on file for this visit.   Reather Littler 03/01/2019, 3:26 PM    Note: This office note was prepared with Dragon voice recognition system technology. Any transcriptional errors that result from this process are unintentional.

## 2019-03-01 NOTE — Patient Instructions (Signed)
1/2 tab alternating with 1

## 2019-03-02 ENCOUNTER — Other Ambulatory Visit: Payer: Self-pay

## 2019-03-04 ENCOUNTER — Encounter (HOSPITAL_COMMUNITY): Payer: Self-pay | Admitting: Family Medicine

## 2019-03-04 ENCOUNTER — Ambulatory Visit (HOSPITAL_COMMUNITY)
Admission: EM | Admit: 2019-03-04 | Discharge: 2019-03-04 | Disposition: A | Discharge status: Home / Self Care | Payer: Self-pay | Attending: Family Medicine | Admitting: Family Medicine

## 2019-03-04 ENCOUNTER — Other Ambulatory Visit: Payer: Self-pay

## 2019-03-04 DIAGNOSIS — G4451 Hemicrania continua: Secondary | ICD-10-CM

## 2019-03-04 MED ORDER — INDOMETHACIN 25 MG PO CAPS: 25.0000 mg | ORAL_CAPSULE | Freq: Three times a day (TID) | ORAL | 0 refills | Status: DC | PRN

## 2019-03-04 NOTE — ED Triage Notes (Signed)
Pt states she has pain on the right side of her head and neck. Pt states she has an appt with [facility] 03/28/19. Pt she going to be seeing them for this issue. Pt states she has weakness in her arms and a pain in see right ear.

## 2019-03-04 NOTE — ED Provider Notes (Signed)
[facility]    CSN: [id number] Arrival date & time: 03/04/19  1618     History   Chief Complaint Chief Complaint  Patient presents with  . Headache    HPI Tracy Carson is a 38 y.o. female.   [facility].  She developed a headache on June 6 and this is persisted to the present time.  It is characterized by sharpness which is constant and sometimes worse than others.  She is also had intermittent conjunctival injection on the right side.  The pain is always located on the right face and is associated with some swelling in her cheek.  She also has some tenderness in the submandibular glands.  Patient has no photophobia or phonophobia.  She does have a ear pain and sometimes has a sensation of noises in her right ear.  She is having no trouble swallowing.  She has had no fever.  Patient has poor dentition and has been given 1 course of clindamycin to see if that would eradicate the pain.  Most of the affected teeth are on the left side.  Patient has a history of hyperthyroidism which is controlled now with methimazole.  She has seen the thyroid doctor recently who says that she is doing well with respect to the thyroid.     Past Medical History:  Diagnosis Date  . Hypertension   . Thyroid disease   . Tuberculosis    as a child    Patient Active Problem List   Diagnosis Date Noted  . 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  . History of blurry vision 05/20/2014  . History of itching of eye 05/20/2014  . Other fatigue 05/20/2014  . Tobacco dependence 05/20/2014  . Palpitations 05/20/2014  . Abdominal pain 11/03/2012  . 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 Tachycardia 11/03/2012  . Hematochezia 11/03/2012  . Graves disease 11/03/2012  . Anemia 11/03/2012  . Breech presentation 07/02/2011  . Cesarean delivery, without mention of indication, delivered, with or without mention of antepartum condition 07/02/2011    Past Surgical History:   Procedure Laterality Date  . CESAREAN SECTION  07/02/2011   Procedure: CESAREAN SECTION;  Surgeon: Roseanna RainbowLisa A Jackson-Moore, MD;  Location: [facility];  Service: Gynecology;  Laterality: N/A;  . CHOLECYSTECTOMY      OB History    Gravida  4   Para  4   Term  4   Preterm      AB      Living  4     SAB      TAB      Ectopic      Multiple      Live Births  3            Home Medications    Prior to Admission medications   Medication Sig Start Date End Date Taking? Authorizing Provider  hydrOXYzine (ATARAX/VISTARIL) 25 MG tablet Take 1 tablet (25 mg total) by mouth every 6 (six) hours as needed for anxiety. 02/28/19   Anders SimmondsMcClung, Angela M, PA-C  ibuprofen (ADVIL) 800 MG tablet Take 1 tablet (800 mg total) by mouth every 8 (eight) hours as needed. 02/28/19   Anders SimmondsMcClung, Angela M, PA-C  indomethacin (INDOCIN) 25 MG capsule Take 1 capsule (25 mg total) by mouth 3 (three) times daily as needed. 03/04/19   Elvina SidleLauenstein, Taela Charbonneau, MD  methimazole (TAPAZOLE) 10 MG tablet Take 0.5 tablets (5 mg total) by mouth daily. 02/08/19   Anders SimmondsMcClung, Angela M, PA-C    Family  History Family History  Problem Relation Age of Onset  . Diabetes Mother   . Thyroid disease Paternal Grandmother   . Thyroid disease Sister   . Thyroid disease Brother     Social History Social History   Tobacco Use  . Smoking status: Current Some Day Smoker    Packs/day: 5.00    Years: 1.00    Pack years: 5.00    Types: Cigars  . Smokeless tobacco: Former Engineer, waterUser  Substance Use Topics  . Alcohol use: No  . Drug use: No     Allergies   Patient has no known allergies.   Review of Systems Review of Systems   Physical Exam Triage Vital Signs ED Triage Vitals  Enc Vitals Group     BP      Pulse      Resp      Temp      Temp src      SpO2      Weight      Height      Head Circumference      Peak Flow      Pain Score      Pain Loc      Pain Edu?      Excl. in GC?    No data found.  Updated Vital Signs BP  127/87 (BP Location: Right Arm)   Pulse 90   Temp 98.9 F (37.2 C) (Oral)   Resp 16   Wt 78 kg   SpO2 98%   BMI 31.46 kg/m    Physical Exam Vitals signs and nursing note reviewed.  Constitutional:      Appearance: She is well-developed.  HENT:     Head:     Comments: Mild right cheek diffuse swelling without tenderness or induration  A few missing teeth on right upper molars.  Carie left lower molar.  Tender bilateral submandibular nodes.    Mouth/Throat:     Mouth: Mucous membranes are moist.  Eyes:     General: No visual field deficit.    Extraocular Movements: Extraocular movements intact.     Pupils: Pupils are equal, round, and reactive to light.     Comments: Normal fundi  Neck:     Musculoskeletal: Normal range of motion and neck supple.  Pulmonary:     Effort: Pulmonary effort is normal.  Lymphadenopathy:     Cervical: Cervical adenopathy present.  Neurological:     Mental Status: She is alert.     Cranial Nerves: Facial asymmetry present. No cranial nerve deficit or dysarthria.     Sensory: No sensory deficit.     Motor: No weakness.     Coordination: Coordination normal.  Psychiatric:        Mood and Affect: Mood normal.        Speech: Speech normal.     Comments: Patient is tearful when explaining that nothing she has been given has helped.      UC Treatments / Results  Labs (all labs ordered are listed, but only abnormal results are displayed) Labs Reviewed - No data to display  EKG   Radiology No results found.  Procedures Procedures (including critical care time)  Medications Ordered in UC Medications - No data to display  Initial Impression / Assessment and Plan / UC Course  I have reviewed the triage vital signs and the nursing notes.  Pertinent labs & imaging results that were available during my care of the patient were reviewed by me  and considered in my medical decision making (see chart for details).    Final Clinical  Impressions(s) / UC Diagnoses   Final diagnoses:  Hemicrania continua     Discharge Instructions     Please call the neurologist daily for appointment     ED Prescriptions    Medication Sig Dispense Auth. Provider   indomethacin (INDOCIN) 25 MG capsule Take 1 capsule (25 mg total) by mouth 3 (three) times daily as needed. 30 capsule Robyn Haber, MD     Controlled Substance Prescriptions [place] Controlled Substance Registry consulted? Not Applicable   Robyn Haber, MD 03/04/19 [phone]

## 2019-03-04 NOTE — Discharge Instructions (Addendum)
Please call the neurologist daily for appointment

## 2019-03-07 ENCOUNTER — Ambulatory Visit: Payer: Self-pay | Admitting: Endocrinology

## 2019-03-09 ENCOUNTER — Encounter (HOSPITAL_COMMUNITY): Payer: Self-pay | Admitting: Emergency Medicine

## 2019-03-09 ENCOUNTER — Other Ambulatory Visit: Payer: Self-pay

## 2019-03-09 ENCOUNTER — Emergency Department (HOSPITAL_COMMUNITY)
Admission: EM | Admit: 2019-03-09 | Discharge: 2019-03-09 | Disposition: A | Discharge status: Home / Self Care | Payer: Self-pay | Attending: Emergency Medicine | Admitting: Emergency Medicine

## 2019-03-09 DIAGNOSIS — R51 Headache: Secondary | ICD-10-CM | POA: Insufficient documentation

## 2019-03-09 DIAGNOSIS — Z5321 Procedure and treatment not carried out due to patient leaving prior to being seen by health care provider: Secondary | ICD-10-CM | POA: Insufficient documentation

## 2019-03-09 NOTE — ED Notes (Signed)
Pt gave stickers to registration 

## 2019-03-09 NOTE — ED Triage Notes (Signed)
Patient states she has had a continual headache since June 4th. Was seen and given new medication 5 days ago, but the pain is still there. Pain 7/10 on R side of her face/ posterior head.

## 2019-03-10 ENCOUNTER — Encounter (HOSPITAL_COMMUNITY): Payer: Self-pay

## 2019-03-10 ENCOUNTER — Other Ambulatory Visit: Payer: Self-pay

## 2019-03-10 ENCOUNTER — Ambulatory Visit (HOSPITAL_COMMUNITY)
Admission: EM | Admit: 2019-03-10 | Discharge: 2019-03-10 | Disposition: A | Discharge status: Home / Self Care | Payer: Self-pay | Attending: Urgent Care | Admitting: Urgent Care

## 2019-03-10 DIAGNOSIS — R519 Headache, unspecified: Secondary | ICD-10-CM

## 2019-03-10 DIAGNOSIS — M542 Cervicalgia: Secondary | ICD-10-CM

## 2019-03-10 DIAGNOSIS — K029 Dental caries, unspecified: Secondary | ICD-10-CM

## 2019-03-10 DIAGNOSIS — R220 Localized swelling, mass and lump, head: Secondary | ICD-10-CM

## 2019-03-10 MED ORDER — PREDNISONE 20 MG PO TABS: ORAL_TABLET | ORAL | 0 refills | Status: DC

## 2019-03-10 MED ORDER — AMOXICILLIN-POT CLAVULANATE 875-125 MG PO TABS: 1.0000 | ORAL_TABLET | Freq: Two times a day (BID) | ORAL | 0 refills | Status: DC

## 2019-03-10 MED ORDER — FLUCONAZOLE 150 MG PO TABS: 150.0000 mg | ORAL_TABLET | ORAL | 0 refills | Status: DC

## 2019-03-10 MED ORDER — NAPROXEN 500 MG PO TABS: 500.0000 mg | ORAL_TABLET | Freq: Two times a day (BID) | ORAL | 0 refills | Status: DC

## 2019-03-10 NOTE — ED Triage Notes (Signed)
Pt present right side neck, face, head pain. Symptoms started two months ago. Pt states that she was recently  Seen here last Sunday and was told to report back if pain gets any worst.

## 2019-03-10 NOTE — ED Provider Notes (Signed)
MRN: [id number] DOB: 11-05-1980  Subjective:   Tracy Carson is a 38 y.o. female presenting for persistent right sided moderate to severe headache, left-sided facial swelling over lower part of face and jaw.  Patient also reports neck pain posteriorly and under her jawline.  Of note, patient has been using indomethacin as was prescribed at her last office visit here.  She has not gotten relief from this.  She does have a history of dental caries and has been scheduled to have dental work but has pushed it off due to financial burden and also current symptoms.  Denies any tooth pain, sinus congestion, runny nose, sore throat, cough.  No current facility-administered medications for this encounter.   Current Outpatient Medications:  .  hydrOXYzine (ATARAX/VISTARIL) 25 MG tablet, Take 1 tablet (25 mg total) by mouth every 6 (six) hours as needed for anxiety., Disp: 60 tablet, Rfl: 0 .  ibuprofen (ADVIL) 800 MG tablet, Take 1 tablet (800 mg total) by mouth every 8 (eight) hours as needed., Disp: 60 tablet, Rfl: 0 .  indomethacin (INDOCIN) 25 MG capsule, Take 1 capsule (25 mg total) by mouth 3 (three) times daily as needed., Disp: 30 capsule, Rfl: 0 .  methimazole (TAPAZOLE) 10 MG tablet, Take 0.5 tablets (5 mg total) by mouth daily., Disp: 30 tablet, Rfl: 6   No Known Allergies  Past Medical History:  Diagnosis Date  . Hypertension   . Thyroid disease   . 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 CESAREAN SECTION  07/02/2011   Procedure: CESAREAN SECTION;  Surgeon: Roseanna Rainbow, MD;  Location: [facility];  Service: Gynecology;  Laterality: N/A;  . CHOLECYSTECTOMY      ROS  Objective:   Vitals: BP (!) 141/90 (BP Location: Right Arm)   Pulse 95   Temp 98.7 F (37.1 C) (Oral)   Resp 18   SpO2 97%   Physical Exam Constitutional:      General: She is not in acute distress.    Appearance: Normal appearance. She is well-developed.  She is not ill-appearing, toxic-appearing or diaphoretic.  HENT:     Head: Normocephalic and atraumatic.      Right Ear: Tympanic membrane and ear canal normal. No drainage or tenderness. No middle ear effusion. Tympanic membrane is not erythematous.     Left Ear: Tympanic membrane and ear canal normal. No drainage or tenderness.  No middle ear effusion. Tympanic membrane is not erythematous.     Nose: Nose normal. No congestion or rhinorrhea.     Mouth/Throat:     Mouth: Mucous membranes are moist. No oral lesions.     Dentition: Gingival swelling and dental caries present.     Pharynx: Oropharynx is clear. No pharyngeal swelling, oropharyngeal exudate, posterior oropharyngeal erythema or uvula swelling.     Tonsils: No tonsillar exudate or tonsillar abscesses.   Eyes:     Extraocular Movements: Extraocular movements intact.     Right eye: Normal extraocular motion.     Left eye: Normal extraocular motion.     Conjunctiva/sclera: Conjunctivae normal.     Pupils: Pupils are equal, round, and reactive to light.  Neck:     Musculoskeletal: Normal range of motion and neck supple.  Cardiovascular:     Rate and Rhythm: Normal rate and regular rhythm.     Pulses: Normal pulses.     Heart sounds: Normal heart sounds. No murmur. No friction  rub. No gallop.   Pulmonary:     Effort: Pulmonary effort is normal. No respiratory distress.     Breath sounds: Normal breath sounds. No stridor. No wheezing, rhonchi or rales.  Lymphadenopathy:     Cervical: No cervical adenopathy.  Skin:    General: Skin is warm and dry.     Findings: No rash.  Neurological:     General: No focal deficit present.     Mental Status: She is alert and oriented to person, place, and time.     Cranial Nerves: No cranial nerve deficit.     Motor: No weakness.     Coordination: Coordination normal.     Gait: Gait normal.  Psychiatric:        Mood and Affect: Mood normal.        Behavior: Behavior normal.         Thought Content: Thought content normal.        Judgment: Judgment normal.     Assessment and Plan :   1. Dental caries   2. Facial swelling   3. Facial pain   4. Neck pain     Will cover for severe dental infection with Augmentin.  Use prednisone for diffuse inflammation, swelling.  Recommended patient switch to naproxen when she is done with the prednisone course for pain and inflammation.  Emphasized need to maintain appointments with her dental surgeon.  Patient does get yeast infections associated with antibiotic use and therefore will use Diflucan for this.   Wallis Bamberg, PA-C 03/11/19 1327

## 2019-03-10 NOTE — Discharge Instructions (Addendum)
Augmentin es el antibiotico para su infeccion. Prednisone es para el dolor y la inflamacion. Diflucan es para infeccion de ongo que resulta de tomar [employer]. Deje de tomar indomethacin. Tome naproxen hoy en la noche y dejelo mientras que tome prednisone.

## 2019-03-19 ENCOUNTER — Other Ambulatory Visit: Payer: Self-pay

## 2019-03-19 ENCOUNTER — Emergency Department (HOSPITAL_COMMUNITY)
Admission: EM | Admit: 2019-03-19 | Discharge: 2019-03-19 | Disposition: A | Discharge status: Home / Self Care | Payer: Self-pay | Attending: Emergency Medicine | Admitting: Emergency Medicine

## 2019-03-19 DIAGNOSIS — F17290 Nicotine dependence, other tobacco product, uncomplicated: Secondary | ICD-10-CM | POA: Insufficient documentation

## 2019-03-19 DIAGNOSIS — W208XXD Other cause of strike by thrown, projected or falling object, subsequent encounter: Secondary | ICD-10-CM | POA: Insufficient documentation

## 2019-03-19 DIAGNOSIS — I10 Essential (primary) hypertension: Secondary | ICD-10-CM | POA: Insufficient documentation

## 2019-03-19 DIAGNOSIS — Z79899 Other long term (current) drug therapy: Secondary | ICD-10-CM | POA: Insufficient documentation

## 2019-03-19 DIAGNOSIS — R519 Headache, unspecified: Secondary | ICD-10-CM

## 2019-03-19 DIAGNOSIS — R51 Headache: Secondary | ICD-10-CM | POA: Insufficient documentation

## 2019-03-19 MED ORDER — DIPHENHYDRAMINE HCL 50 MG/ML IJ SOLN
25.0000 mg | Freq: Once | INTRAMUSCULAR | Status: AC
  Administered 2019-03-19: 11:00:00 25 mg via INTRAMUSCULAR
  Filled 2019-03-19: qty 1

## 2019-03-19 MED ORDER — KETOROLAC TROMETHAMINE 60 MG/2ML IM SOLN
15.0000 mg | Freq: Once | INTRAMUSCULAR | Status: AC
  Administered 2019-03-19: 11:00:00 15 mg via INTRAMUSCULAR
  Filled 2019-03-19: qty 2

## 2019-03-19 MED ORDER — DEXAMETHASONE 4 MG PO TABS
10.0000 mg | ORAL_TABLET | Freq: Once | ORAL | Status: AC
  Administered 2019-03-19: 10 mg via ORAL
  Filled 2019-03-19: qty 3

## 2019-03-19 MED ORDER — PROCHLORPERAZINE EDISYLATE 10 MG/2ML IJ SOLN
10.0000 mg | Freq: Once | INTRAMUSCULAR | Status: AC
  Administered 2019-03-19: 10 mg via INTRAMUSCULAR
  Filled 2019-03-19: qty 2

## 2019-03-19 NOTE — ED Provider Notes (Signed)
[facility] Provider Note   CSN: [id number] Arrival date & time: 03/19/19  [id number]     History   Chief Complaint Chief Complaint  Patient presents with   Headache    HPI Griselda MinerLaura Rea Estrada is a 38 y.o. female.     38 yo F with a chief complaints of a headache.  This is been ongoing for at least the past couple months.  Preceding this event she was struck in the head by some drywall that had fallen from the ceiling and she was doing some work in a house.  She has been seen multiple times in the ED.  Has had a CT angiogram of the head and neck that was negative.  She is seen her family doctor for this as well.  Is scheduled to see the neurologist in a week or 2.  She feels that the pain does not change.  But it seems to come and go.  Some days are better than others.  Mostly to the posterior aspect of her neck and the occipital aspect of her head.  Denies cough congestion or fever.  Denies unilateral numbness or weakness denies difficulty with speech or swallowing.  Denies difficulty with ambulation.  Denies dizziness.  This pain sometimes radiates to her face.  Happens off and on bilaterally.  Scribed that sensation as someone walking across her face.  The history is provided by the patient.  Headache Pain location:  Occipital Quality:  Sharp and stabbing Radiates to:  Face Severity currently:  6/10 Severity at highest:  9/10 Onset quality:  Gradual Duration:  8 weeks Timing:  Constant Progression:  Worsening Chronicity:  New Similar to prior headaches: yes   Context comment:  Turning of her neck to the right Relieved by:  Nothing Worsened by:  Nothing Ineffective treatments:  None tried Associated symptoms: no congestion, no dizziness, no fever, no myalgias, no nausea and no vomiting     Past Medical History:  Diagnosis Date   Hypertension    Thyroid disease    Tuberculosis    as a child    Patient Active Problem List   Diagnosis Date Noted   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   History of blurry vision 05/20/2014   History of itching of eye 05/20/2014   Other fatigue 05/20/2014   Tobacco dependence 05/20/2014   Palpitations 05/20/2014   Abdominal pain 11/03/2012   Proctitis 11/03/2012   Tachycardia 11/03/2012   Hematochezia 11/03/2012   Graves disease 11/03/2012   Anemia 11/03/2012   Breech presentation 07/02/2011   Cesarean delivery, without mention of indication, delivered, with or without mention of antepartum condition 07/02/2011    Past Surgical History:  Procedure Laterality Date   CESAREAN SECTION  07/02/2011   Procedure: CESAREAN SECTION;  Surgeon: Roseanna RainbowLisa A Jackson-Moore, MD;  Location: [facility];  Service: Gynecology;  Laterality: N/A;   CHOLECYSTECTOMY       OB History    Gravida  4   Para  4   Term  4   Preterm      AB      Living  4     SAB      TAB      Ectopic      Multiple      Live Births  3            Home Medications    Prior to Admission medications   Medication Sig Start Date End Date Taking? Authorizing Provider  fluconazole (DIFLUCAN) 150 MG tablet Take 1 tablet (150 mg total) by mouth once a week. 03/10/19  Yes Wallis BambergMani, Mario, PA-C  indomethacin (INDOCIN) 25 MG capsule Take 1 capsule (25 mg total) by mouth 3 (three) times daily as needed. 03/04/19  Yes Elvina SidleLauenstein, Kurt, MD  methimazole (TAPAZOLE) 10 MG tablet Take 0.5 tablets (5 mg total) by mouth daily. 02/08/19  Yes Georgian CoMcClung, Angela M, PA-C  naproxen (NAPROSYN) 500 MG tablet Take 1 tablet (500 mg total) by mouth 2 (two) times daily. 03/10/19  Yes Wallis BambergMani, Mario, PA-C  amoxicillin-clavulanate (AUGMENTIN) 875-125 MG tablet Take 1 tablet by mouth every 12 (twelve) hours. Patient not taking: Reported on 03/19/2019 03/10/19   Wallis BambergMani, Mario, PA-C  hydrOXYzine (ATARAX/VISTARIL) 25 MG tablet Take 1 tablet (25 mg total) by mouth every 6 (six) hours as needed for anxiety. Patient not taking: Reported on 03/19/2019  02/28/19   Anders SimmondsMcClung, Angela M, PA-C  ibuprofen (ADVIL) 800 MG tablet Take 1 tablet (800 mg total) by mouth every 8 (eight) hours as needed. Patient not taking: Reported on 03/19/2019 02/28/19   Anders SimmondsMcClung, Angela M, PA-C  predniSONE (DELTASONE) 20 MG tablet Take 2 tablets daily with breakfast. Patient not taking: Reported on 03/19/2019 03/10/19   Wallis BambergMani, Mario, PA-C    Family History Family History  Problem Relation Age of Onset   Diabetes Mother    Thyroid disease Paternal Grandmother    Thyroid disease Sister    Thyroid disease Brother     Social History Social History   Tobacco Use   Smoking status: Current Some Day Smoker    Packs/day: 5.00    Years: 1.00    Pack years: 5.00    Types: Cigars   Smokeless tobacco: Former NeurosurgeonUser  Substance Use Topics   Alcohol use: No   Drug use: No     Allergies   Patient has no known allergies.   Review of Systems Review of Systems  Constitutional: Negative for chills and fever.  HENT: Negative for congestion and rhinorrhea.   Eyes: Negative for redness and visual disturbance.  Respiratory: Negative for shortness of breath and wheezing.   Cardiovascular: Negative for chest pain and palpitations.  Gastrointestinal: Negative for nausea and vomiting.  Genitourinary: Negative for dysuria and urgency.  Musculoskeletal: Negative for arthralgias and myalgias.  Skin: Negative for pallor and wound.  Neurological: Positive for headaches. Negative for dizziness.     Physical Exam Updated Vital Signs BP 133/79    Pulse 68    Temp 98.3 F (36.8 C) (Oral)    Resp 16    Ht 5\' 2"  (1.575 m)    Wt 78 kg    SpO2 100%    BMI 31.45 kg/m   Physical Exam Vitals signs and nursing note reviewed.  Constitutional:      General: She is not in acute distress.    Appearance: She is well-developed. She is not diaphoretic.  HENT:     Head: Normocephalic and atraumatic.  Eyes:     Pupils: Pupils are equal, round, and reactive to light.  Neck:      Musculoskeletal: Normal range of motion and neck supple.  Cardiovascular:     Rate and Rhythm: Normal rate and regular rhythm.     Heart sounds: No murmur. No friction rub. No gallop.   Pulmonary:     Effort: Pulmonary effort is normal.     Breath sounds: No wheezing or rales.  Abdominal:     General: There is no distension.  Palpations: Abdomen is soft.     Tenderness: There is no abdominal tenderness.  Musculoskeletal:        General: Tenderness present.     Comments: Tenderness to the paraspinal musculature of the C-spine bilaterally.  Significant pain to the attachment of the trapezius to the occiput bilaterally.  No midline C-spine tenderness.  Able to rotate her head without significant difficulty.  Skin:    General: Skin is warm and dry.  Neurological:     Mental Status: She is alert and oriented to person, place, and time.     Cranial Nerves: Cranial nerves are intact.     Sensory: Sensation is intact.     Motor: Motor function is intact.     Coordination: Coordination is intact.     Gait: Gait is intact.     Comments: Benign neurologic exam  Psychiatric:        Behavior: Behavior normal.      ED Treatments / Results  Labs (all labs ordered are listed, but only abnormal results are displayed) Labs Reviewed - No data to display  EKG None  Radiology No results found.  Procedures Procedures (including critical care time)  Medications Ordered in ED Medications  prochlorperazine (COMPAZINE) injection 10 mg (10 mg Intramuscular Given 03/19/19 1120)  diphenhydrAMINE (BENADRYL) injection 25 mg (25 mg Intramuscular Given 03/19/19 1121)  ketorolac (TORADOL) injection 15 mg (15 mg Intramuscular Given 03/19/19 1119)  dexamethasone (DECADRON) tablet 10 mg (10 mg Oral Given 03/19/19 1118)     Initial Impression / Assessment and Plan / ED Course  I have reviewed the triage vital signs and the nursing notes.  Pertinent labs & imaging results that were available during  my care of the patient were reviewed by me and considered in my medical decision making (see chart for details).        38 yo F with a chief complaints of posterior headache and neck pain.  She has a benign neurologic exam for me.  Has had multiple work-ups in the ED for this.  Has had CT imaging already.  She is awaiting a appointment with a neurologist that will take a couple weeks.  Sounds most like tension headaches based on history and exam.  We will give a headache cocktail here in the ED and reassess.  Patient feeling better on reassessment.  Discharge home.  PCP and neurology follow-up.  12:27 PM:  I have discussed the diagnosis/risks/treatment options with the patient and believe the pt to be eligible for discharge home to follow-up with PCP, neuro. We also discussed returning to the ED immediately if new or worsening sx occur. We discussed the sx which are most concerning (e.g., sudden worsening pain, fever, inability to tolerate by mouth, stroke, s/sx) that necessitate immediate return. Medications administered to the patient during their visit and any new prescriptions provided to the patient are listed below.  Medications given during this visit Medications  prochlorperazine (COMPAZINE) injection 10 mg (10 mg Intramuscular Given 03/19/19 1120)  diphenhydrAMINE (BENADRYL) injection 25 mg (25 mg Intramuscular Given 03/19/19 1121)  ketorolac (TORADOL) injection 15 mg (15 mg Intramuscular Given 03/19/19 1119)  dexamethasone (DECADRON) tablet 10 mg (10 mg Oral Given 03/19/19 1118)     The patient appears reasonably screen and/or stabilized for discharge and I doubt any other medical condition or other [facility] requiring further screening, evaluation, or treatment in the ED at this time prior to discharge.    Final Clinical Impressions(s) / ED Diagnoses  Final diagnoses:  Occipital headache    ED Discharge Orders    None       Melene PlanFloyd, Jasia Hiltunen, DO 03/19/19 1227

## 2019-03-19 NOTE — ED Triage Notes (Signed)
Patient reports intermittent headache x 2 months, denies history of headaches. Also endorses bilateral tingling of face x 3 days. No significant medical history. Denies other symptoms. Neuro intact.

## 2019-03-19 NOTE — Discharge Instructions (Signed)
Follow-up with the neurologist as scheduled.  Please return to the emergency department for worsening headache inability to swallow difficulty speaking unilateral numbness or weakness.

## 2019-03-27 ENCOUNTER — Telehealth: Payer: Self-pay | Admitting: Family Medicine

## 2019-03-27 NOTE — Telephone Encounter (Signed)
New Message   Pt calling states she has questions about her ct scan. Please f/u

## 2019-03-28 ENCOUNTER — Ambulatory Visit (INDEPENDENT_AMBULATORY_CARE_PROVIDER_SITE_OTHER): Payer: Self-pay | Admitting: Diagnostic Neuroimaging

## 2019-03-28 ENCOUNTER — Encounter: Payer: Self-pay | Admitting: Diagnostic Neuroimaging

## 2019-03-28 ENCOUNTER — Other Ambulatory Visit: Payer: Self-pay

## 2019-03-28 VITALS — BP 113/72 | HR 70 | Temp 98.0°F | Ht 61.0 in | Wt 172.4 lb

## 2019-03-28 DIAGNOSIS — R51 Headache: Secondary | ICD-10-CM

## 2019-03-28 DIAGNOSIS — R519 Headache, unspecified: Secondary | ICD-10-CM

## 2019-03-28 MED ORDER — RIZATRIPTAN BENZOATE 10 MG PO TBDP: 10.0000 mg | ORAL_TABLET | ORAL | 11 refills | Status: DC | PRN

## 2019-03-28 MED ORDER — AMITRIPTYLINE HCL 25 MG PO TABS: 25.0000 mg | ORAL_TABLET | Freq: Every day | ORAL | 3 refills | Status: DC

## 2019-03-28 NOTE — Progress Notes (Signed)
[facility]  PATIENT: Tracy Carson DOB: 06/13/1981  REFERRING CLINICIAN: Dennison Bulla, E HISTORY FROM: patient  REASON FOR VISIT: new consult    HISTORICAL  CHIEF COMPLAINT:  Chief Complaint  Patient presents with  . Headache    rm 7 New Pt, ED referral, Interpreter- Grecia, "pain in my neck, head and face x 3 months, took prescription ibuprofen 800 mg which didn't help; my pain is less now but still hurts; hx of playing w/daughters and standing on my head, and sheetrock falling on my head while remodeling; 2 visits to ED for IV pain medication"    HISTORY OF PRESENT ILLNESS:   38 year old female here for evaluation of headaches.    June 2020 patient had onset of right-sided face, neck pain and swelling.  1 week prior to onset of symptoms patient had an accident where a piece of sheet rock fell on the top of her head, causing mild pain but no significant problems.  She also had been playing with her children around that time and doing headstands which caused some pressure in the top of her head.  Then she started to have pain and headache in the right side of her head radiating to the back of the head and neck.  Sometimes associated with blurred vision and nausea, conjunctival injection and tearing.  Sometimes associated with right-sided facial sweating.  Headaches can last 15 to 20 minutes at a time.  Headaches occur on a daily basis.  Now headaches occurring twice per day.  Patient also had significant anxiety and stress related to these headaches.  She had multiple emergency room evaluations.  No prior similar problems or headaches when she was younger.  Patient is followed up with ENT and dentist.  No specific ENT problems have been found.  She has been recommended to have multiple dental extractions due to periodontal disease bilaterally.  CT soft tissue of the neck showed nonspecific swelling of right platysma.  CTA of the head and neck was normal.  Patient  is tried indomethacin without relief.  Also tried Tylenol, ibuprofen, naproxen, prednisone, antibiotics without relief.   REVIEW OF SYSTEMS: Full 14 system review of systems performed and negative with exception of: Blurred vision eye pain increased thirst snoring anxiety headache numbness weakness insomnia.   ALLERGIES: No Known Allergies  HOME MEDICATIONS: Outpatient Medications Prior to Visit  Medication Sig Dispense Refill  . amoxicillin-clavulanate (AUGMENTIN) 875-125 MG tablet Take 1 tablet by mouth every 12 (twelve) hours. 20 tablet 0  . methimazole (TAPAZOLE) 10 MG tablet Take 0.5 tablets (5 mg total) by mouth daily. 30 tablet 6  . naproxen (NAPROSYN) 500 MG tablet Take 1 tablet (500 mg total) by mouth 2 (two) times daily. 30 tablet 0  . fluconazole (DIFLUCAN) 150 MG tablet Take 1 tablet (150 mg total) by mouth once a week. (Patient not taking: Reported on 03/28/2019) 2 tablet 0  . hydrOXYzine (ATARAX/VISTARIL) 25 MG tablet Take 1 tablet (25 mg total) by mouth every 6 (six) hours as needed for anxiety. (Patient not taking: Reported on 03/28/2019) 60 tablet 0  . ibuprofen (ADVIL) 800 MG tablet Take 1 tablet (800 mg total) by mouth every 8 (eight) hours as needed. (Patient not taking: Reported on 03/28/2019) 60 tablet 0  . indomethacin (INDOCIN) 25 MG capsule Take 1 capsule (25 mg total) by mouth 3 (three) times daily as needed. (Patient not taking: Reported on 03/28/2019) 30 capsule 0  . predniSONE (DELTASONE) 20 MG tablet Take 2 tablets  daily with breakfast. (Patient not taking: Reported on 03/19/2019) 10 tablet 0   No facility-administered medications prior to visit.     PAST MEDICAL HISTORY: Past Medical History:  Diagnosis Date  . Hypertension   . Thyroid disease    hyper  . Tuberculosis    as a child    PAST SURGICAL HISTORY: Past Surgical History:  Procedure Laterality Date  . CESAREAN SECTION  07/02/2011   Procedure: CESAREAN SECTION;  Surgeon: Roseanna Rainbow, MD;   Location: [facility];  Service: Gynecology;  Laterality: N/A;  . CHOLECYSTECTOMY  2002    FAMILY HISTORY: Family History  Problem Relation Age of Onset  . Diabetes Mother   . Hypertension Father   . Thyroid disease Paternal Grandmother   . Thyroid disease Sister   . Thyroid disease Brother     SOCIAL HISTORY: Social History   Socioeconomic History  . Marital status: Married    Spouse name: Blossom Hoops  . Number of children: 4  . Years of education: 7  . Highest education level: Not on file  Occupational History    Comment: na  Social Needs  . Financial resource strain: Not on file  . Food insecurity    Worry: Not on file    Inability: Not on file  . Transportation needs    Medical: Not on file    Non-medical: Not on file  Tobacco Use  . Smoking status: Current Some Day Smoker    Packs/day: 5.00    Years: 1.00    Pack years: 5.00    Types: Cigars  . Smokeless tobacco: Former Neurosurgeon  . Tobacco comment: 03/28/19 7-8/day  Substance and Sexual Activity  . Alcohol use: No    Comment: quit 10/2018  . Drug use: No  . Sexual activity: Yes    Birth control/protection: Implant  Lifestyle  . Physical activity    Days per week: Not on file    Minutes per session: Not on file  . Stress: Not on file  Relationships  . Social Musician on phone: Not on file    Gets together: Not on file    Attends religious service: Not on file    Active member of club or organization: Not on file    Attends meetings of clubs or organizations: Not on file    Relationship status: Not on file  . Intimate partner violence    Fear of current or ex partner: Not on file    Emotionally abused: Not on file    Physically abused: Not on file    Forced sexual activity: Not on file  Other Topics Concern  . Not on file  Social History Narrative   Lives with spouse, 4 daughters   Some caffeine     PHYSICAL EXAM  GENERAL EXAM/CONSTITUTIONAL: Vitals:  Vitals:   03/28/19 0817  BP: 113/72   Pulse: 70  Temp: 98 F (36.7 C)  Weight: 172 lb 6.4 oz (78.2 kg)  Height: 5\' 1"  (1.549 m)     Body mass index is 32.57 kg/m. Wt Readings from Last 3 Encounters:  03/28/19 172 lb 6.4 oz (78.2 kg)  03/19/19 171 lb 15.3 oz (78 kg)  03/04/19 172 lb (78 kg)     Patient is in no distress; well developed, nourished and groomed; neck is supple  CARDIOVASCULAR:  Examination of carotid arteries is normal; no carotid bruits  Regular rate and rhythm, no murmurs  Examination of peripheral vascular system by observation  and palpation is normal  EYES:  Ophthalmoscopic exam of optic discs and posterior segments is normal; no papilledema or hemorrhages  No exam data present  MUSCULOSKELETAL:  Gait, strength, tone, movements noted in Neurologic exam below  NEUROLOGIC: MENTAL STATUS:  No flowsheet data found.  awake, alert, oriented to person, place and time  recent and remote memory intact  normal attention and concentration  language fluent, comprehension intact, naming intact  fund of knowledge appropriate  CRANIAL NERVE:   2nd - no papilledema on fundoscopic exam  2nd, 3rd, 4th, 6th - pupils equal and reactive to light, visual fields full to confrontation, extraocular muscles intact, no nystagmus  5th - facial sensation symmetric  7th - facial strength symmetric  8th - hearing intact  9th - palate elevates symmetrically, uvula midline  11th - shoulder shrug symmetric  12th - tongue protrusion midline  MOTOR:   normal bulk and tone, full strength in the BUE, BLE  SENSORY:   normal and symmetric to light touch, pinprick, temperature, vibration  COORDINATION:   finger-nose-finger, fine finger movements normal  REFLEXES:   deep tendon reflexes present and symmetric  GAIT/STATION:   narrow based gait     DIAGNOSTIC DATA (LABS, IMAGING, TESTING) - I reviewed patient records, labs, notes, testing and imaging myself where available.  Lab  Results  Component Value Date   WBC 8.4 01/19/2019   HGB 15.2 (H) 01/19/2019   HCT 41.9 01/19/2019   MCV 85.2 01/19/2019   PLT 179 01/19/2019      Component Value Date/Time   NA 137 01/19/2019 0139   K 3.9 01/19/2019 0139   CL 112 (H) 01/19/2019 0139   CO2 19 (L) 01/19/2019 0139   GLUCOSE 106 (H) 01/19/2019 0139   BUN 8 01/19/2019 0139   CREATININE 0.47 01/19/2019 0139   CREATININE 0.41 (L) 06/27/2014 1150   CALCIUM 8.8 (L) 01/19/2019 0139   PROT 7.2 01/06/2019 1308   ALBUMIN 3.8 01/06/2019 1308   AST 25 01/06/2019 1308   ALT 27 01/06/2019 1308   ALKPHOS 122 01/06/2019 1308   BILITOT 0.5 01/06/2019 1308   GFRNONAA >60 01/19/2019 0139   GFRNONAA >89 05/16/2014 1204   GFRAA >60 01/19/2019 0139   GFRAA >89 05/16/2014 1204   Lab Results  Component Value Date   CHOL 112 11/15/2016   HDL 24 (L) 11/15/2016   LDLCALC 56 11/15/2016   TRIG 162 (H) 11/15/2016   CHOLHDL 4.7 (H) 11/15/2016   Lab Results  Component Value Date   HGBA1C 5.2 11/15/2016   Lab Results  Component Value Date   VITAMINB12 1,137 (H) 11/03/2012   Lab Results  Component Value Date   TSH <0.010 (L) 01/06/2019    01/19/19 CTA head / neck [I reviewed images myself and agree with interpretation. -VRP]  - normal  12/29/18 CT soft tissue neck - Thickening of the right platysma muscle with subcutaneous induration of the lower right face, overlying the right submandibular gland. This may indicate submandibular sialadenitis with surrounding edema. No sialolithiasis.    ASSESSMENT AND PLAN  38 y.o. year old female here with:  Ddx: SUNA (sudden unilateral neuralgiform headache attacks with autonomic symptoms) vs cluster headaches vs paroxysmal hemicrania  1. Nonintractable headache, unspecified chronicity pattern, unspecified headache type     PLAN:  - check MRI brain w/wo - start amitriptyline 25mg  at bedtime; may consider lamotrigine, topiramate, gabapentin, oxygen in future - trial of rizatriptan  as needed  Orders Placed This Encounter  Procedures  .  MR BRAIN W WO CONTRAST   Meds ordered this encounter  Medications  . amitriptyline (ELAVIL) 25 MG tablet    Sig: Take 1 tablet (25 mg total) by mouth at bedtime.    Dispense:  30 tablet    Refill:  3   Return in about 3 months (around 06/27/2019) for with NP (Amy Lomax).    Suanne Marker, MD 03/28/2019, 9:05 AM Certified in Neurology, Neurophysiology and Neuroimaging  [facility] [address] [place], [place] [address] [phone]

## 2019-03-28 NOTE — Patient Instructions (Addendum)
-   check MRI brain   - start amitriptyline 25mg  at bedtime  - rizatriptan 10mg  as needed for headache; may repeat x 1 after 2 hours; max 2 per day or 8 per month  - continue tylenol, ibuprofen as needed

## 2019-04-05 ENCOUNTER — Emergency Department (HOSPITAL_COMMUNITY): Payer: Self-pay

## 2019-04-05 ENCOUNTER — Emergency Department (HOSPITAL_COMMUNITY)
Admission: EM | Admit: 2019-04-05 | Discharge: 2019-04-06 | Disposition: A | Discharge status: Home / Self Care | Payer: Self-pay | Attending: Emergency Medicine | Admitting: Emergency Medicine

## 2019-04-05 ENCOUNTER — Other Ambulatory Visit: Payer: Self-pay

## 2019-04-05 ENCOUNTER — Encounter (HOSPITAL_COMMUNITY): Payer: Self-pay | Admitting: Emergency Medicine

## 2019-04-05 DIAGNOSIS — I10 Essential (primary) hypertension: Secondary | ICD-10-CM | POA: Insufficient documentation

## 2019-04-05 DIAGNOSIS — R200 Anesthesia of skin: Secondary | ICD-10-CM | POA: Insufficient documentation

## 2019-04-05 DIAGNOSIS — M79602 Pain in left arm: Secondary | ICD-10-CM

## 2019-04-05 DIAGNOSIS — F17210 Nicotine dependence, cigarettes, uncomplicated: Secondary | ICD-10-CM | POA: Insufficient documentation

## 2019-04-05 DIAGNOSIS — M79622 Pain in left upper arm: Secondary | ICD-10-CM | POA: Insufficient documentation

## 2019-04-05 DIAGNOSIS — R202 Paresthesia of skin: Secondary | ICD-10-CM | POA: Insufficient documentation

## 2019-04-05 DIAGNOSIS — Z79899 Other long term (current) drug therapy: Secondary | ICD-10-CM | POA: Insufficient documentation

## 2019-04-05 DIAGNOSIS — E079 Disorder of thyroid, unspecified: Secondary | ICD-10-CM | POA: Insufficient documentation

## 2019-04-05 MED ORDER — ACETAMINOPHEN-CODEINE #3 300-30 MG PO TABS: 1.0000 | ORAL_TABLET | Freq: Three times a day (TID) | ORAL | 0 refills | Status: AC | PRN

## 2019-04-05 NOTE — ED Notes (Signed)
Patient transported to X-ray 

## 2019-04-05 NOTE — ED Provider Notes (Signed)
[facility] Note   CSN: [id number] Arrival date & time: 04/05/19  1955     History   Chief Complaint Chief Complaint  Patient presents with  . Arm Pain    HPI Tracy Carson is a 38 y.o. female. Patient presents with 2 days of left arm pain that is constant worse with motion began gradually when patient first noticed she was seated on her couch at home.  Patient describes the pain as burning with intermittent numbness and tingling.  Denies any weakness but states that she is limited in her use of her arm due to the pain.  Patient denies any radiation of the pain but states she has separate pain in her neck and head that is been present for an extended period of time.  Patient is scheduled with neurology for an MRI on the 17th related to her neck pain and headaches.  Patient states she recently had a dental surgery and is taking and Tylenol/codeine.  Patient has follow-up appointment with dentist on 04/11/2019.  Patient denies any nausea, vomiting, chest pain, shortness of breath, leg pain or swelling.    HPI  Past Medical History:  Diagnosis Date  . Hypertension   . Thyroid disease    hyper  . Tuberculosis    as a child    Patient Active Problem List   Diagnosis Date Noted  . 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  . History of blurry vision 05/20/2014  . History of itching of eye 05/20/2014  . Other fatigue 05/20/2014  . Tobacco dependence 05/20/2014  . Palpitations 05/20/2014  . Abdominal pain 11/03/2012  . 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 Tachycardia 11/03/2012  . Hematochezia 11/03/2012  . Graves disease 11/03/2012  . Anemia 11/03/2012  . Breech presentation 07/02/2011  . Cesarean delivery, without mention of indication, delivered, with or without mention of antepartum condition 07/02/2011    Past Surgical History:  Procedure Laterality Date  . CESAREAN SECTION  07/02/2011   Procedure: CESAREAN SECTION;  Surgeon: Roseanna RainbowLisa A  Jackson-Moore, MD;  Location: [facility];  Service: Gynecology;  Laterality: N/A;  . CHOLECYSTECTOMY  2002     OB History    Gravida  4   Para  4   Term  4   Preterm      AB      Living  4     SAB      TAB      Ectopic      Multiple      Live Births  3            Home Medications    Prior to Admission medications   Medication Sig Start Date End Date Taking? Authorizing Provider  amitriptyline (ELAVIL) 25 MG tablet Take 1 tablet (25 mg total) by mouth at bedtime. 03/28/19   Penumalli, Glenford BayleyVikram R, MD  amoxicillin-clavulanate (AUGMENTIN) 875-125 MG tablet Take 1 tablet by mouth every 12 (twelve) hours. 03/10/19   Wallis BambergMani, Mario, PA-C  methimazole (TAPAZOLE) 10 MG tablet Take 0.5 tablets (5 mg total) by mouth daily. 02/08/19   Anders SimmondsMcClung, Angela M, PA-C  naproxen (NAPROSYN) 500 MG tablet Take 1 tablet (500 mg total) by mouth 2 (two) times daily. 03/10/19   Wallis BambergMani, Mario, PA-C  rizatriptan (MAXALT-MLT) 10 MG disintegrating tablet Take 1 tablet (10 mg total) by mouth as needed for migraine. May repeat in 2 hours if needed 03/28/19   Penumalli, Glenford BayleyVikram R, MD    Family History Family History  Problem Relation  Age of Onset  . Diabetes Mother   . Hypertension Father   . Thyroid disease Paternal Grandmother   . Thyroid disease Sister   . Thyroid disease Brother     Social History Social History   Tobacco Use  . Smoking status: Current Some Day Smoker    Packs/day: 5.00    Years: 1.00    Pack years: 5.00    Types: Cigars  . Smokeless tobacco: Former Systems developer  . Tobacco comment: 03/28/19 7-8/day  Substance Use Topics  . Alcohol use: No    Comment: quit 10/2018  . Drug use: No     Allergies   Patient has no known allergies.   Review of Systems Review of Systems  Constitutional: Negative for chills and fever.  HENT: Negative for congestion.   Eyes: Negative for pain.  Respiratory: Negative for shortness of breath.   Cardiovascular: Negative for chest pain and leg swelling.   Gastrointestinal: Negative for abdominal pain.  Genitourinary: Negative for dysuria.  Musculoskeletal: Positive for neck pain.  Skin: Negative for rash.  Neurological: Positive for weakness, numbness and headaches. Negative for dizziness.     Physical Exam Updated Vital Signs BP 133/76 (BP Location: Right Arm)   Pulse 79   Temp 98 F (36.7 C) (Oral)   Resp 16   SpO2 100%   Physical Exam Vitals signs and nursing note reviewed.  Constitutional:      General: She is not in acute distress. HENT:     Head: Normocephalic and atraumatic.     Nose: Nose normal.  Eyes:     General: No scleral icterus. Neck:     Musculoskeletal: Normal range of motion.  Cardiovascular:     Rate and Rhythm: Normal rate and regular rhythm.     Pulses: Normal pulses.     Heart sounds: Normal heart sounds.  Pulmonary:     Effort: Pulmonary effort is normal. No respiratory distress.     Breath sounds: No wheezing.  Abdominal:     Palpations: Abdomen is soft.     Tenderness: There is no abdominal tenderness. There is no guarding.  Musculoskeletal:     Right lower leg: No edema.     Left lower leg: No edema.     Comments: Distal upper extremity pulses intact.  Grip strength 4/5 left hand secondary to pain.  FROM actively all upper extremity joints.  Pain in biceps with supination of left palm as well as with shoulder flexion.  Tenderness over hormonal implant in the region of the left medial mid humerus.  No bony tenderness.  Skin:    General: Skin is warm and dry.     Capillary Refill: Capillary refill takes less than 2 seconds.  Neurological:     Mental Status: She is alert and oriented to person, place, and time. Mental status is at baseline.     Sensory: No sensory deficit.     Comments: Patient has intact sensation in upper extremities, fingertips, face and toes  Psychiatric:        Mood and Affect: Mood normal.        Behavior: Behavior normal.      ED Treatments / Results  Labs (all  labs ordered are listed, but only abnormal results are displayed) Labs Reviewed - No data to display  EKG None  Radiology Dg Humerus Left  Result Date: 04/05/2019 CLINICAL DATA:  Pain around the proximal humerus at the hormonal implant site EXAM: LEFT HUMERUS - 2+ VIEW COMPARISON:  None. FINDINGS: There is no evidence of fracture or other focal bone lesions. Radiopaque birth control implant is seen. No significant overlying soft tissue swelling. IMPRESSION: No acute osseous abnormality. Electronically Signed   By: Jonna Clark M.D.   On: 04/05/2019 23:05    Procedures Procedures (including critical care time)  Medications Ordered in ED Medications - No data to display   Initial Impression / Assessment and Plan / ED Course  I have reviewed the triage vital signs and the nursing notes.  Pertinent labs & imaging results that were available during my care of the patient were reviewed by me and considered in my medical decision making (see chart for details).       Patient is 38 year old female presenting with 2 days of arm pain that is constant numbness that is intermittent.  Patient has been seen for neck and face pain and numbness in the past and had recent CT angiography of neck and head without any acute finding other than right platysma muscle with subcutaneous induration.  Patient was discharged previously and made follow-up MRI brain appointment with neurology for the 17th of September.  Patient's neurologic exam is reassuring with no focal deficits other than left arm weakness secondary to pain. Patient is DNVI in left arm. Plain film of left humerus without any acute abnormality.  Nexplanon is visible on film. Since tenderness on arm is only over the implant I suspect this is either irritation of the implant or a new manifestation of the patients neck pain which may be causing radicular pain in her arm. Explained importance of following up for MRI and neurology appointment which she  has already made.  Patient still having face pain after having all of her upper teeth removed.  Patient discharged with return precautions.  Patient's Tylenol/codeine refilled for 5 days supply and given lengthy discussion on importance of using ibuprofen for pain control as much as possible and minimizing use of narcotic pain medication.      Final Clinical Impressions(s) / ED Diagnoses   Final diagnoses:  None    ED Discharge Orders    None       Gailen Shelter, [place] 04/06/19 0027    Charlynne Pander, MD 04/10/19 [phone]

## 2019-04-05 NOTE — ED Triage Notes (Signed)
Pt c/o left arm pain x 2 days. Denies injury/trauma. Pt states she has an implant in her left arm and is concerned it isn't working correctly anymore.

## 2019-04-06 NOTE — ED Notes (Signed)
Patient verbalizes understanding of discharge instructions. Opportunity for questioning and answers were provided. Armband removed by staff, pt discharged from ED ambulatory.   

## 2019-04-06 NOTE — Discharge Instructions (Addendum)
Keep your dentist and neurology appointments as they are currently scheduled.  Please return if you have any new or concerning symptoms.

## 2019-04-10 ENCOUNTER — Other Ambulatory Visit (INDEPENDENT_AMBULATORY_CARE_PROVIDER_SITE_OTHER): Payer: Self-pay

## 2019-04-10 ENCOUNTER — Other Ambulatory Visit: Payer: Self-pay

## 2019-04-10 DIAGNOSIS — E0590 Thyrotoxicosis, unspecified without thyrotoxic crisis or storm: Secondary | ICD-10-CM

## 2019-04-11 LAB — T4, FREE: Free T4: 1.61 ng/dL — ABNORMAL HIGH (ref 0.60–1.60)

## 2019-04-11 LAB — TSH: TSH: <0.01 u[IU]/mL — ABNORMAL LOW (ref 0.35–4.50)

## 2019-04-11 LAB — T3, FREE: T3, Free: 4.3 pg/mL — ABNORMAL HIGH (ref 2.3–4.2)

## 2019-04-12 ENCOUNTER — Ambulatory Visit (HOSPITAL_COMMUNITY)
Admission: RE | Admit: 2019-04-12 | Discharge: 2019-04-12 | Disposition: A | Discharge status: Home / Self Care | Payer: Self-pay | Source: Ambulatory Visit | Attending: Diagnostic Neuroimaging | Admitting: Diagnostic Neuroimaging

## 2019-04-12 ENCOUNTER — Other Ambulatory Visit: Payer: Self-pay

## 2019-04-12 DIAGNOSIS — R51 Headache: Secondary | ICD-10-CM | POA: Insufficient documentation

## 2019-04-12 DIAGNOSIS — R519 Headache, unspecified: Secondary | ICD-10-CM

## 2019-04-12 LAB — CREATININE, SERUM
Creatinine, Ser: 0.56 mg/dL (ref 0.44–1.00)
GFR calc Af Amer: >60 mL/min (ref >60)
GFR calc non Af Amer: >60 mL/min (ref >60)

## 2019-04-12 MED ORDER — GADOBUTROL 1 MMOL/ML IV SOLN
8.0000 mL | Freq: Once | INTRAVENOUS | Status: AC | PRN
  Administered 2019-04-12: 8 mL via INTRAVENOUS

## 2019-04-13 ENCOUNTER — Ambulatory Visit (INDEPENDENT_AMBULATORY_CARE_PROVIDER_SITE_OTHER): Payer: Self-pay | Admitting: Endocrinology

## 2019-04-13 ENCOUNTER — Encounter: Payer: Self-pay | Admitting: Endocrinology

## 2019-04-13 DIAGNOSIS — E0590 Thyrotoxicosis, unspecified without thyrotoxic crisis or storm: Secondary | ICD-10-CM

## 2019-04-13 DIAGNOSIS — E0500 Thyrotoxicosis with diffuse goiter without thyrotoxic crisis or storm: Secondary | ICD-10-CM

## 2019-04-13 MED ORDER — METHIMAZOLE 10 MG PO TABS: 10.0000 mg | ORAL_TABLET | Freq: Every day | ORAL | 2 refills | Status: DC

## 2019-04-13 NOTE — Progress Notes (Signed)
Patient ID: Tracy Carson, female   DOB: 17-Sep-1980, 38 y.o.   MRN: [id number]                                                                                                               Reason for Appointment:  Hyperthyroidism, follow-up visit  Today's office visit was provided via telemedicine using video technique The patient was explained the limitations of evaluation and management by telemedicine and the availability of in person appointments.  The patient understood the limitations and agreed to proceed. Patient also understood that the telehealth visit is billable. . Location of the patient: Patient's home . Location of the provider: Physician office Only the patient and myself were participating in the encounter    Chief complaint: Follow-up of thyroid   History of Present Illness:   Prior history: Her hyperthyroidism was diagnosed in 2012 before a pregnancy At that time she had symptoms of palpitations, shakiness, feeling excessively warm, difficulty sleeping, increased appetite and hair loss She was treated with methimazole She was recommended I-131 treatment in 2014 but even though she had an uptake test done she did not go for the treatment Subsequently has been very irregular with her follow-up program and medications She says she was taking her methimazole 2 tablets twice a day every third day or so because of cost Because of worsening symptoms of palpitations, feeling excessively hot and losing weight she presented to the emergency room on 11/08/16 and was admitted for 2 days  On her initial consultation in her free T4 was still very high at 3.04 and she was having problems with fatigue, weakness and heat intolerance along with tachycardia. Her methimazole was increased to 3 tablets twice a day initially   She finally agreed to do that I-131 treatment when she was seen in June and this was done with 19.5 mCi on 02/16/17  RECENT history  In October 2018 she had  normal thyroid functions and had no complaints She was told to come back in about 2 months but she did not  She went to the emergency room in June 2020 and she thought she had swollen glands in her neck along with anxiety and palpitations At that time her thyroid levels showed hyperthyroidism and free T4 was significantly high at 2.3 She was started on methimazole 10 mg twice daily by her PCP on 01/11/2019  However with this she started feeling tired and stopped her methimazole 2 weeks prior to her follow-up At that point her free T4 was normal but free T3 was high at 5.4  On her initial consultation back in August with her labs being upper normal and still she was having symptoms she was told to take 10 mg alternating with 5 mg methimazole instead of 5 mg daily  More recently she has still continued to have some shakiness and palpitations, also having fatigue Her weight is the same Currently she is taking her methimazole regularly but she apparently did not take her medication between 9/4 and 9/8 probably because  of a dental procedure   Her free T3 is now slightly higher and free T4 has gone up also   Wt Readings from Last 3 Encounters:  03/28/19 172 lb 6.4 oz (78.2 kg)  03/19/19 171 lb 15.3 oz (78 kg)  03/04/19 172 lb (78 kg)     Thyroid function tests as follows:     Lab Results  Component Value Date   FREET4 1.61 (H) 04/10/2019   FREET4 1.34 02/26/2019   FREET4 1.30 02/01/2019   T3FREE 4.3 (H) 04/10/2019   T3FREE 4.2 02/26/2019   T3FREE 5.4 (H) 02/01/2019   TSH <0.01 (L) 04/10/2019   TSH <0.010 (L) 01/06/2019   TSH <0.01 (L) 05/13/2017    No results found for: THYROTRECAB   Allergies as of 04/13/2019   No Known Allergies     Medication List       Accurate as of April 13, 2019  2:51 PM. If you have any questions, ask your nurse or doctor.        amitriptyline 25 MG tablet Commonly known as: ELAVIL Take 1 tablet (25 mg total) by mouth at bedtime.    amoxicillin-clavulanate 875-125 MG tablet Commonly known as: AUGMENTIN Take 1 tablet by mouth every 12 (twelve) hours.   methimazole 10 MG tablet Commonly known as: TAPAZOLE Take 0.5 tablets (5 mg total) by mouth daily.   naproxen 500 MG tablet Commonly known as: NAPROSYN Take 1 tablet (500 mg total) by mouth 2 (two) times daily.   rizatriptan 10 MG disintegrating tablet Commonly known as: MAXALT-MLT Take 1 tablet (10 mg total) by mouth as needed for migraine. May repeat in 2 hours if needed           Past Medical History:  Diagnosis Date  . Hypertension   . Thyroid disease    hyper  . Tuberculosis    as a child    Past Surgical History:  Procedure Laterality Date  . CESAREAN SECTION  07/02/2011   Procedure: CESAREAN SECTION;  Surgeon: Roseanna Rainbow, MD;  Location: [facility];  Service: Gynecology;  Laterality: N/A;  . CHOLECYSTECTOMY  2002    Family History  Problem Relation Age of Onset  . Diabetes Mother   . Hypertension Father   . Thyroid disease Paternal Grandmother   . Thyroid disease Sister   . Thyroid disease Brother     Social History:  reports that she has been smoking cigars. She has a 5.00 pack-year smoking history. She has quit using smokeless tobacco. She reports that she does not drink alcohol or use drugs.  Allergies: No Known Allergies   Review of Systems  She has had periodic trips to the emergency room for various reasons   Examination:   There were no vitals taken for this visit.      Assessment/Plan:   Graves' disease, treated with radioactive iodine in July 2018  She is being treated for a recurrence of her hyperthyroidism Currently with 5 mg of methimazole alternating with 10 mg her symptoms are still not controlled She did however leave off the medication for a few days the week before her labs were drawn However considering that her labs show increased thyroid levels and she is symptomatic she will need to increase her  dosage further  She has now take 10 mg daily Currently still not wanting to schedule I-131 treatment  Follow-up in 1 month    There are no Patient Instructions on file for this visit.   Reather Littler  04/13/2019, 2:51 PM    Note: This office note was prepared with Dragon voice recognition system technology. Any transcriptional errors that result from this process are unintentional.

## 2019-04-18 ENCOUNTER — Telehealth: Payer: Self-pay | Admitting: *Deleted

## 2019-04-18 NOTE — Telephone Encounter (Signed)
Spoke with patient and daughter and informed her MRI result is unremarkable, no cute abnormalities. She stated her headaches were better. I advised she continue taking medications as prescribed, call before FU if needed. Patient and daughter verbalized understanding, appreciation.

## 2019-05-01 ENCOUNTER — Ambulatory Visit: Payer: Self-pay | Attending: Family Medicine | Admitting: Family Medicine

## 2019-05-01 ENCOUNTER — Encounter: Payer: Self-pay | Admitting: Family Medicine

## 2019-05-01 ENCOUNTER — Other Ambulatory Visit: Payer: Self-pay

## 2019-05-01 VITALS — BP 124/77 | HR 76 | Temp 98.5°F | Ht 61.0 in | Wt 166.0 lb

## 2019-05-01 DIAGNOSIS — G44209 Tension-type headache, unspecified, not intractable: Secondary | ICD-10-CM

## 2019-05-01 DIAGNOSIS — E0590 Thyrotoxicosis, unspecified without thyrotoxic crisis or storm: Secondary | ICD-10-CM

## 2019-05-01 DIAGNOSIS — M542 Cervicalgia: Secondary | ICD-10-CM

## 2019-05-01 NOTE — Progress Notes (Signed)
Subjective:  Patient ID: Tracy Carson, female    DOB: 1981-04-26  Age: 38 y.o. MRN: [id number]  CC: Hypothyroidism   HPI Tracy Carson is a 38 year old female with hyperthyroidism secondary to Graves' disease (treated with radioactive iodine in 01/2017) who presents today for follow-up visit. She continues to complain of submandibular, right face and right neck pain and swelling.  Seen by ENT with no known etiology for her symptoms.  Neck imaging revealed thickening of platysma muscle, submandibular sialoadenitis.  She was treated with antibiotics.  Currently on amitriptyline and Maxalt by neurology. Of note she had Nexplanon placed 1 year ago.  Past Medical History:  Diagnosis Date  . Hypertension   . Thyroid disease    hyper  . Tuberculosis    as a child    Past Surgical History:  Procedure Laterality Date  . CESAREAN SECTION  07/02/2011   Procedure: CESAREAN SECTION;  Surgeon: Roseanna Rainbow, MD;  Location: [facility];  Service: Gynecology;  Laterality: N/A;  . CHOLECYSTECTOMY  2002    Family History  Problem Relation Age of Onset  . Diabetes Mother   . Hypertension Father   . Thyroid disease Paternal Grandmother   . Thyroid disease Sister   . Thyroid disease Brother     No Known Allergies  Outpatient Medications Prior to Visit  Medication Sig Dispense Refill  . amitriptyline (ELAVIL) 25 MG tablet Take 1 tablet (25 mg total) by mouth at bedtime. 30 tablet 3  . methimazole (TAPAZOLE) 10 MG tablet Take 1 tablet (10 mg total) by mouth daily. 30 tablet 2  . naproxen (NAPROSYN) 500 MG tablet Take 1 tablet (500 mg total) by mouth 2 (two) times daily. 30 tablet 0  . rizatriptan (MAXALT-MLT) 10 MG disintegrating tablet Take 1 tablet (10 mg total) by mouth as needed for migraine. May repeat in 2 hours if needed 9 tablet 11  . amoxicillin-clavulanate (AUGMENTIN) 875-125 MG tablet Take 1 tablet by mouth every 12 (twelve) hours. (Patient not taking: Reported on  05/01/2019) 20 tablet 0   No facility-administered medications prior to visit.      ROS Review of Systems  Constitutional: Negative for activity change, appetite change and fatigue.  HENT: Negative for congestion, sinus pressure and sore throat.   Eyes: Negative for visual disturbance.  Respiratory: Negative for cough, chest tightness, shortness of breath and wheezing.   Cardiovascular: Negative for chest pain and palpitations.  Gastrointestinal: Negative for abdominal distention, abdominal pain and constipation.  Endocrine: Negative for polydipsia.  Genitourinary: Negative for dysuria and frequency.  Musculoskeletal: Positive for neck pain. Negative for arthralgias and back pain.  Skin: Negative for rash.  Neurological: Negative for tremors, light-headedness and numbness.  Hematological: Does not bruise/bleed easily.  Psychiatric/Behavioral: Negative for agitation and behavioral problems.    Objective:  BP 124/77   Pulse 76   Temp 98.5 F (36.9 C) (Oral)   Ht 5\' 1"  (1.549 m)   Wt 166 lb (75.3 kg)   SpO2 97%   BMI 31.37 kg/m   BP/Weight 05/01/2019 04/05/2019 03/28/2019  Systolic BP 124 133 113  Diastolic BP 77 76 72  Wt. (Lbs) 166 - 172.4  BMI 31.37 - 32.57      Physical Exam Constitutional:      Appearance: She is well-developed.  Neck:     Musculoskeletal: Normal range of motion. No neck rigidity or muscular tenderness.     Vascular: No JVD.  Cardiovascular:     Rate and Rhythm:  Normal rate.     Heart sounds: Normal heart sounds. No murmur.  Pulmonary:     Effort: Pulmonary effort is normal.     Breath sounds: Normal breath sounds. No wheezing or rales.  Chest:     Chest wall: No tenderness.  Abdominal:     General: Bowel sounds are normal. There is no distension.     Palpations: Abdomen is soft. There is no mass.     Tenderness: There is no abdominal tenderness.  Musculoskeletal: Normal range of motion.     Right lower leg: No edema.     Left lower leg: No  edema.  Lymphadenopathy:     Cervical: No cervical adenopathy.  Neurological:     Mental Status: She is alert and oriented to person, place, and time.  Psychiatric:        Mood and Affect: Mood normal.     CMP Latest Ref Rng & Units 04/12/2019 01/19/2019 01/06/2019  Glucose 70 - 99 mg/dL - 664(Q) 034(V)  BUN 6 - 20 mg/dL - 8 6  Creatinine 4.25 - 1.00 mg/dL 9.56 3.87 5.64  Sodium 135 - 145 mmol/L - 137 137  Potassium 3.5 - 5.1 mmol/L - 3.9 3.8  Chloride 98 - 111 mmol/L - 112(H) 109  CO2 22 - 32 mmol/L - 19(L) 21(L)  Calcium 8.9 - 10.3 mg/dL - 8.8(L) 9.1  Total Protein 6.5 - 8.1 g/dL - - 7.2  Total Bilirubin 0.3 - 1.2 mg/dL - - 0.5  Alkaline Phos 38 - 126 U/L - - 122  AST 15 - 41 U/L - - 25  ALT 0 - 44 U/L - - 27    Lipid Panel     Component Value Date/Time   CHOL 112 11/15/2016 1038   TRIG 162 (H) 11/15/2016 1038   HDL 24 (L) 11/15/2016 1038   CHOLHDL 4.7 (H) 11/15/2016 1038   LDLCALC 56 11/15/2016 1038    CBC    Component Value Date/Time   WBC 8.4 01/19/2019 0139   RBC 4.92 01/19/2019 0139   HGB 15.2 (H) 01/19/2019 0139   HGB 13.3 11/15/2016 1038   HCT 41.9 01/19/2019 0139   HCT 38.1 11/15/2016 1038   PLT 179 01/19/2019 0139   PLT 185 11/15/2016 1038   MCV 85.2 01/19/2019 0139   MCV 81 11/15/2016 1038   MCH 30.9 01/19/2019 0139   MCHC 36.3 (H) 01/19/2019 0139   RDW 11.5 01/19/2019 0139   RDW 13.5 11/15/2016 1038   LYMPHSABS 1.8 01/06/2019 1308   LYMPHSABS 2.7 11/15/2016 1038   MONOABS 0.4 01/06/2019 1308   EOSABS 0.1 01/06/2019 1308   EOSABS 0.2 11/15/2016 1038   BASOSABS 0.0 01/06/2019 1308   BASOSABS 0.0 11/15/2016 1038    Lab Results  Component Value Date   HGBA1C 5.2 11/15/2016    Assessment & Plan:   1. Thyrotoxicosis w/o crisis Currently on methimazole Followed by endocrine  2. Muscle tension headache On amitriptyline and Maxalt May need to consider muscle relaxant if symptoms persist Followed by neurology  3. Neck pain Could be  secondary to headaches Advised to apply heat    No orders of the defined types were placed in this encounter.   Follow-up: Return in about 2 months (around 07/01/2019) for PAP smear.       [name], MD, FAAFP. [facility] [place], [place] [phone]   05/01/2019, 3:21 PM

## 2019-05-01 NOTE — Patient Instructions (Signed)
Tension Headache, Adult A tension headache is pain, pressure, or aching in your head. Tension headaches can last from 30 minutes to several days. Follow these instructions at home: Managing pain  Take over-the-counter and prescription medicines only as told by your doctor.  When you have a headache, lie down in a dark, quiet room.  If told, put ice on your head and neck: ? Put ice in a plastic bag. ? Place a towel between your skin and the bag. ? Leave the ice on for 20 minutes, 2-3 times a day.  If told, put heat on the back of your neck. Do this as often as your doctor tells you to. Use the kind of heat that your doctor recommends, such as a moist heat pack or a heating pad. ? Place a towel between your skin and the heat. ? Leave the heat on for 20-30 minutes. ? Remove the heat if your skin turns bright red. Eating and drinking  Eat meals on a regular schedule.  Watch how much alcohol you drink: ? If you are a woman and are not pregnant, do not drink more than 1 drink a day. ? If you are a man, do not drink more than 2 drinks a day.  Drink enough fluid to keep your pee (urine) pale yellow.  Do not use a lot of caffeine, or stop using caffeine. Lifestyle  Get enough sleep. Get 7-9 hours of sleep each night. Or get the amount of sleep that your doctor tells you to.  At bedtime, remove all electronic devices from your room. Examples of electronic devices are computers, phones, and tablets.  Find ways to lessen your stress. Some things that can lessen stress are: ? Exercise. ? Deep breathing. ? Yoga. ? Music. ? Positive thoughts.  Sit up straight. Do not tighten (tense) your muscles.  Do not use any products that have nicotine or tobacco in them, such as cigarettes and e-cigarettes. If you need help quitting, ask your doctor. General instructions   Keep all follow-up visits as told by your doctor. This is important.  Avoid things that can bring on headaches. Keep a  journal to find out if certain things bring on headaches. For example, write down: ? What you eat and drink. ? How much sleep you get. ? Any change to your diet or medicines. Contact a doctor if:  Your headache does not get better.  Your headache comes back.  You have a headache and sounds, light, or smells bother you.  You feel sick to your stomach (nauseous) or you throw up (vomit).  Your stomach hurts. Get help right away if:  You suddenly get a very bad headache along with any of these: ? A stiff neck. ? Feeling sick to your stomach. ? Throwing up. ? Feeling weak. ? Trouble seeing. ? Feeling short of breath. ? A rash. ? Feeling unusually sleepy. ? Trouble speaking. ? Pain in your eye or ear. ? Trouble walking or balancing. ? Feeling like you will pass out (faint). ? Passing out. Summary  A tension headache is pain, pressure, or aching in your head.  Tension headaches can last from 30 minutes to several days.  Lifestyle changes and medicines may help relieve pain. This information is not intended to replace advice given to you by your health care provider. Make sure you discuss any questions you have with your health care provider. Document Released: 10/06/2009 Document Revised: 06/24/2017 Document Reviewed: 10/22/2016 Elsevier Patient Education  2020 [employer]vier   Inc.  

## 2019-05-14 ENCOUNTER — Telehealth: Payer: Self-pay | Admitting: Diagnostic Neuroimaging

## 2019-05-14 NOTE — Telephone Encounter (Signed)
Phone rep checked office voicemail; from 10:36 Pt was called back and she states the amitriptyline (ELAVIL) 25 MG tablet makes her sleepy.  Pt states she has pain in her neck, in her eyes and problems with ears as a result of rizatriptan (MAXALT-MLT) 10 MG.  Pt states she is in more pain since before she started these two medications, please call

## 2019-05-15 NOTE — Telephone Encounter (Addendum)
Called patient and advised her of Dr Penumalli's advise. She stated the dentist pulled her teeth a month ago. She stated she's having  pain behind her neck and in her eyes, and she hears noise in her ears. She would like an appointment to be seen sooner. I advised will discuss with Dr Leta Baptist and let her know. She  verbalized understanding, appreciation. Called patient back and due to ongoing symptoms we rescheduled her Dec FU with NP to next week. She  verbalized understanding, appreciation.

## 2019-05-15 NOTE — Telephone Encounter (Signed)
Stop meds. Follow up with dentist. -VRP

## 2019-05-15 NOTE — Telephone Encounter (Signed)
Okay to move up that we may not have had enough time for amitriptyline to help.  I would suggest 6 to 8 weeks of continued therapy.

## 2019-05-15 NOTE — Telephone Encounter (Signed)
Called patient and advised her of NP's message. She had already stopped taking amitriptyline. I suggested she begin again. She confirmed FU appointment time,  verbalized understanding, appreciation.

## 2019-05-16 ENCOUNTER — Other Ambulatory Visit: Payer: Self-pay

## 2019-05-16 ENCOUNTER — Encounter (HOSPITAL_COMMUNITY): Payer: Self-pay

## 2019-05-16 ENCOUNTER — Emergency Department (HOSPITAL_COMMUNITY)
Admission: EM | Admit: 2019-05-16 | Discharge: 2019-05-17 | Disposition: A | Discharge status: Home / Self Care | Payer: Self-pay | Attending: Emergency Medicine | Admitting: Emergency Medicine

## 2019-05-16 DIAGNOSIS — R519 Headache, unspecified: Secondary | ICD-10-CM | POA: Insufficient documentation

## 2019-05-16 DIAGNOSIS — F17290 Nicotine dependence, other tobacco product, uncomplicated: Secondary | ICD-10-CM | POA: Insufficient documentation

## 2019-05-16 DIAGNOSIS — Z79899 Other long term (current) drug therapy: Secondary | ICD-10-CM | POA: Insufficient documentation

## 2019-05-16 DIAGNOSIS — E079 Disorder of thyroid, unspecified: Secondary | ICD-10-CM | POA: Insufficient documentation

## 2019-05-16 DIAGNOSIS — I10 Essential (primary) hypertension: Secondary | ICD-10-CM | POA: Insufficient documentation

## 2019-05-16 DIAGNOSIS — M542 Cervicalgia: Secondary | ICD-10-CM | POA: Insufficient documentation

## 2019-05-16 DIAGNOSIS — R531 Weakness: Secondary | ICD-10-CM | POA: Insufficient documentation

## 2019-05-16 LAB — CBC
HCT: 45.1 % (ref 36.0–46.0)
Hemoglobin: 16.6 g/dL — ABNORMAL HIGH (ref 12.0–15.0)
MCH: 32.2 pg (ref 26.0–34.0)
MCHC: 36.8 g/dL — ABNORMAL HIGH (ref 30.0–36.0)
MCV: 87.4 fL (ref 80.0–100.0)
Platelets: 230 10*3/uL (ref 150–400)
RBC: 5.16 MIL/uL — ABNORMAL HIGH (ref 3.87–5.11)
RDW: 11.3 % — ABNORMAL LOW (ref 11.5–15.5)
WBC: 7.7 10*3/uL (ref 4.0–10.5)
nRBC: 0 % (ref 0.0–0.2)

## 2019-05-16 LAB — URINALYSIS, ROUTINE W REFLEX MICROSCOPIC
Bilirubin Urine: NEGATIVE
Glucose, UA: NEGATIVE mg/dL
Hgb urine dipstick: NEGATIVE
Ketones, ur: NEGATIVE mg/dL
Leukocytes,Ua: NEGATIVE
Nitrite: NEGATIVE
Protein, ur: NEGATIVE mg/dL
Specific Gravity, Urine: 1.008 (ref 1.005–1.030)
pH: 6 (ref 5.0–8.0)

## 2019-05-16 LAB — BASIC METABOLIC PANEL WITH GFR
Anion gap: 10 (ref 5–15)
BUN: 10 mg/dL (ref 6–20)
CO2: 21 mmol/L — ABNORMAL LOW (ref 22–32)
Calcium: 9.3 mg/dL (ref 8.9–10.3)
Chloride: 104 mmol/L (ref 98–111)
Creatinine, Ser: 0.47 mg/dL (ref 0.44–1.00)
GFR calc Af Amer: >60 mL/min
GFR calc non Af Amer: >60 mL/min
Glucose, Bld: 100 mg/dL — ABNORMAL HIGH (ref 70–99)
Potassium: 3.6 mmol/L (ref 3.5–5.1)
Sodium: 135 mmol/L (ref 135–145)

## 2019-05-16 LAB — I-STAT BETA HCG BLOOD, ED (MC, WL, AP ONLY): I-stat hCG, quantitative: <5 m[IU]/mL (ref <5)

## 2019-05-16 NOTE — ED Triage Notes (Signed)
Pt reports continued neck pain and headache. Pt had a fall 4 months ago and has had pain ever since, pt followed up with neurologist and had MRI done with no acute findings. Pt given Amitriptylin, Rizatriptan, Methimazole for her symptoms with no relief. Pt c.o generalized weakness for the past 2 days. Pt tearful in triage.

## 2019-05-17 MED ORDER — DIPHENHYDRAMINE HCL 25 MG PO CAPS
25.0000 mg | ORAL_CAPSULE | Freq: Once | ORAL | Status: AC
  Administered 2019-05-17: 25 mg via ORAL
  Filled 2019-05-17: qty 1

## 2019-05-17 MED ORDER — METOCLOPRAMIDE HCL 10 MG PO TABS
10.0000 mg | ORAL_TABLET | Freq: Once | ORAL | Status: AC
  Administered 2019-05-17: 02:00:00 10 mg via ORAL
  Filled 2019-05-17: qty 1

## 2019-05-17 MED ORDER — KETOROLAC TROMETHAMINE 30 MG/ML IJ SOLN
30.0000 mg | Freq: Once | INTRAMUSCULAR | Status: AC
  Administered 2019-05-17: 02:00:00 30 mg via INTRAMUSCULAR
  Filled 2019-05-17: qty 1

## 2019-05-17 NOTE — ED Provider Notes (Signed)
[facility] EMERGENCY DEPARTMENT Provider Note   CSN: [id number] Arrival date & time: 05/16/19  1612     History   Chief Complaint Chief Complaint  Patient presents with  . Weakness  . Migraine  . Neck Pain    HPI Tracy Carson is a 38 y.o. female.     Patient presents to the emergency department with chief complaint of headache.  She reports having intermittent headaches for the past 4 months.  She has been seen by neurology, dentistry, and ENT.  She continues to complain of her symptoms.  She has had negative imaging studies.  Had dental extractions, but has not had any relief.  She is taking amitriptyline and rizatriptan, and states that these helped, but did not completely resolve her symptoms.  She denies any fever chills.  Denies neck stiffness.  Denies any numbness or tingling.  The history is provided by the patient. No language interpreter was used.    Past Medical History:  Diagnosis Date  . Hypertension   . Thyroid disease    hyper  . Tuberculosis    as a child    Patient Active Problem List   Diagnosis Date Noted  . 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  . History of blurry vision 05/20/2014  . History of itching of eye 05/20/2014  . Other fatigue 05/20/2014  . Tobacco dependence 05/20/2014  . Palpitations 05/20/2014  . Abdominal pain 11/03/2012  . 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 Tachycardia 11/03/2012  . Hematochezia 11/03/2012  . Graves disease 11/03/2012  . Anemia 11/03/2012  . Breech presentation 07/02/2011  . Cesarean delivery, without mention of indication, delivered, with or without mention of antepartum condition 07/02/2011    Past Surgical History:  Procedure Laterality Date  . CESAREAN SECTION  07/02/2011   Procedure: CESAREAN SECTION;  Surgeon: Roseanna Rainbow, MD;  Location: [facility];  Service: Gynecology;  Laterality: N/A;  . CHOLECYSTECTOMY  2002     OB History    Gravida  4   Para  4   Term  4   Preterm      AB    Living  4     SAB      TAB      Ectopic      Multiple      Live Births  3            Home Medications    Prior to Admission medications   Medication Sig Start Date End Date Taking? Authorizing Provider  amitriptyline (ELAVIL) 25 MG tablet Take 1 tablet (25 mg total) by mouth at bedtime. 03/28/19   Penumalli, Glenford Bayley, MD  amoxicillin-clavulanate (AUGMENTIN) 875-125 MG tablet Take 1 tablet by mouth every 12 (twelve) hours. Patient not taking: Reported on 05/01/2019 03/10/19   Wallis Bamberg, PA-C  methimazole (TAPAZOLE) 10 MG tablet Take 1 tablet (10 mg total) by mouth daily. 04/13/19   Reather Littler, MD  naproxen (NAPROSYN) 500 MG tablet Take 1 tablet (500 mg total) by mouth 2 (two) times daily. 03/10/19   Wallis Bamberg, PA-C  rizatriptan (MAXALT-MLT) 10 MG disintegrating tablet Take 1 tablet (10 mg total) by mouth as needed for migraine. May repeat in 2 hours if needed 03/28/19   Penumalli, Glenford Bayley, MD    Family History Family History  Problem Relation Age of Onset  . Diabetes Mother   . Hypertension Father   . Thyroid disease Paternal Grandmother   . Thyroid disease Sister   . Thyroid disease Brother  Social History Social History   Tobacco Use  . Smoking status: Current Some Day Smoker    Packs/day: 5.00    Years: 1.00    Pack years: 5.00    Types: Cigars  . Smokeless tobacco: Former Systems developer  . Tobacco comment: 03/28/19 7-8/day  Substance Use Topics  . Alcohol use: No    Comment: quit 10/2018  . Drug use: No     Allergies   Patient has no known allergies.   Review of Systems Review of Systems  All other systems reviewed and are negative.    Physical Exam Updated Vital Signs BP 125/74 (BP Location: Left Arm)   Pulse 64   Temp 98.5 F (36.9 C) (Oral)   Resp 18   SpO2 100%   Physical Exam Vitals signs and nursing note reviewed.  Constitutional:      General: She is not in acute distress.    Appearance: She is well-developed.  HENT:     Head:  Normocephalic and atraumatic.  Eyes:     Conjunctiva/sclera: Conjunctivae normal.  Neck:     Musculoskeletal: Neck supple.  Cardiovascular:     Rate and Rhythm: Normal rate and regular rhythm.     Heart sounds: No murmur.  Pulmonary:     Effort: Pulmonary effort is normal. No respiratory distress.     Breath sounds: Normal breath sounds.  Abdominal:     Palpations: Abdomen is soft.     Tenderness: There is no abdominal tenderness.  Skin:    General: Skin is warm and dry.  Neurological:     Mental Status: She is alert and oriented to person, place, and time.     Comments: CN III-XII intact, speech is clear, movements are goal oriented  Psychiatric:        Mood and Affect: Mood normal.        Behavior: Behavior normal.      ED Treatments / Results  Labs (all labs ordered are listed, but only abnormal results are displayed) Labs Reviewed  BASIC METABOLIC PANEL - Abnormal; Notable for the following components:      Result Value   CO2 21 (*)    Glucose, Bld 100 (*)    All other components within normal limits  CBC - Abnormal; Notable for the following components:   RBC 5.16 (*)    Hemoglobin 16.6 (*)    MCHC 36.8 (*)    RDW 11.3 (*)    All other components within normal limits  URINALYSIS, ROUTINE W REFLEX MICROSCOPIC  I-STAT BETA HCG BLOOD, ED ([facility], [facility], [facility] ONLY)    EKG None  Radiology No results found.  Procedures Procedures (including critical care time)  Medications Ordered in ED Medications  ketorolac (TORADOL) 30 MG/ML injection 30 mg (has no administration in time range)  metoCLOPramide (REGLAN) tablet 10 mg (has no administration in time range)  diphenhydrAMINE (BENADRYL) capsule 25 mg (has no administration in time range)     Initial Impression / Assessment and Plan / ED Course  I have reviewed the triage vital signs and the nursing notes.  Pertinent labs & imaging results that were available during my care of the patient were reviewed by me and  considered in my medical decision making (see chart for details).       Patient with persistent headaches.  Has been seen by multiple specialists, without any clear explanation for headaches.  She has had reassuring MRIs and CTs.  We will give headache cocktail.  Recommend  outpatient follow-up.   Final Clinical Impressions(s) / ED Diagnoses   Final diagnoses:  Nonintractable headache, unspecified chronicity pattern, unspecified headache type    ED Discharge Orders    None       Roxy HorsemanBrowning, Ignace Mandigo, PA-C 05/17/19 0117    Zadie RhineWickline, Donald, MD 05/17/19 [phone]

## 2019-05-17 NOTE — Discharge Instructions (Signed)
Ask your doctor about OCCIPITAL NEURALGIA.

## 2019-05-24 ENCOUNTER — Encounter: Payer: Self-pay | Admitting: Family Medicine

## 2019-05-24 ENCOUNTER — Other Ambulatory Visit (INDEPENDENT_AMBULATORY_CARE_PROVIDER_SITE_OTHER): Payer: Self-pay

## 2019-05-24 ENCOUNTER — Telehealth: Payer: Self-pay | Admitting: Family Medicine

## 2019-05-24 ENCOUNTER — Ambulatory Visit (INDEPENDENT_AMBULATORY_CARE_PROVIDER_SITE_OTHER): Payer: Self-pay | Admitting: Family Medicine

## 2019-05-24 ENCOUNTER — Other Ambulatory Visit: Payer: Self-pay

## 2019-05-24 VITALS — BP 117/82 | HR 75 | Temp 98.0°F | Ht 62.0 in | Wt 167.0 lb

## 2019-05-24 DIAGNOSIS — R519 Headache, unspecified: Secondary | ICD-10-CM

## 2019-05-24 DIAGNOSIS — E0590 Thyrotoxicosis, unspecified without thyrotoxic crisis or storm: Secondary | ICD-10-CM

## 2019-05-24 MED ORDER — TOPIRAMATE 25 MG PO TABS: 25.0000 mg | ORAL_TABLET | Freq: Two times a day (BID) | ORAL | 0 refills | Status: DC

## 2019-05-24 NOTE — Progress Notes (Signed)
I reviewed note and agree with plan.   Kyreese Chio R. Rosalie Buenaventura, MD 05/24/2019, 4:52 PM Certified in Neurology, Neurophysiology and Neuroimaging  [facility] [address], [address] [place], [place] [address] [phone]  

## 2019-05-24 NOTE — Patient Instructions (Addendum)
We will start topiramate  twice daily   Please schedule an eye exam with an ophthalmologist asap. I will place a referral.    Please talk to PCP about possible allergies/chronic sinusitis   Follow up with me in 2 months    Cefalea de rebote por consumo excesivo de analgsicos (Analgesic Rebound Headache) Una cefalea de rebote por consumo excesivo de analgsicos, a veces llamada cefalea por abuso de medicamentos, es un dolor de cabeza que se manifiesta despus de que desaparece el efecto de los analgsicos tomados para tratar el dolor de cabeza original (primario). Cualquier tipo de dolor de cabeza primario puede regresar como una cefalea de rebote si una persona toma analgsicos regularmente ms de tres veces por semana para tratarlo. Los tipos de Engineer, mining de cabeza primario que se suelen Clinical biochemist a las cefaleas de rebote incluyen:  Migraas.  Dolores de cabeza que surgen por una tensin muscular en la zona de la cabeza y del cuello (cefaleas tensionales).  Dolores de cabeza que aparecen y vuelven a Research officer, trade union (recurrentes) de un lado de la cabeza y alrededor del ojo (cefaleas en racimos). Si las cefaleas de rebote continan, estas se convierten en dolores de cabeza diarios crnicos. CAUSAS Esta afeccin puede ser causada por el uso frecuente de:  Medicamentos de venta libre, como aspirina, ibuprofeno y paracetamol.  Antisinusticos y otros medicamentos que contienen cafena.  Analgsicos opiceos, como codena y [place]. SNTOMAS Los sntomas de la cefalea de rebote son los mismos que los del dolor de [place] original. Algunos de los sntomas de tipos especficos de cefaleas incluyen lo siguiente: Research officer, trade union  Dolor pulstil e intermitente en uno o ambos lados de la cabeza.  Dolor intenso que dificulta las actividades cotidianas.  Dolor que empeora con la actividad fsica.  Nuseas, vmitos o ambos.  Dolor ante la exposicin a luces brillantes, ruidos fuertes o  aromas intensos.  Sensibilidad general a las luces brillantes, a los ruidos fuertes o a los aromas intensos.  Cambios en la visin.  Adormecimiento de uno o [employer]. Cefalea tensional.  Presin alrededor de la cabeza.  Dolor "sordo" en la cabeza.  Dolor que siente sobre la frente y los lados de la cabeza.  Sensibilidad en los msculos de la cabeza, del cuello y de los hombros. Ottosen en racimos  Dolor intenso que comienza en un ojo, alrededor de un ojo o en la sien.  Enrojecimiento y lagrimeo del ojo del mismo lado del dolor.  Prpados cados o hinchados.  Dolor en un lado de la cabeza.  Nuseas.  Secrecin nasal.  Piel del rostro sudorosa y plida.  Agitacin. DIAGNSTICO Esta afeccin se diagnostica mediante:  Una revisin de sus antecedentes mdicos. Esto incluye la naturaleza de sus dolores de cabeza primarios.  Una revisin de los tipos de analgsicos que ha estado tomando para tratar las cefaleas y la frecuencia con que los toma. TRATAMIENTO Esta afeccin se puede tratar de la siguiente manera:  Interrumpir el uso frecuente de los analgsicos. Al principio, esto puede [employer] cefaleas, pero, con el tiempo, el dolor debe volverse ms controlable, menos frecuente y [employer].  Consultar a un especialista en cefaleas. Tal vez este profesional pueda ayudarlo a Chief Operating Officer las cefaleas y ayudar a Veterinary surgeon que estas no tengan otra causa.  Emplear mtodos alternativos de alivio del estrs, como la acupuntura, la psicoterapia, la biorregulacin y los masajes tambin pueden ayudar. Hable con el mdico respecto de qu mtodo podra ser adecuado para su caso. INSTRUCCIONES PARA EL  [place] los medicamentos de venta libre y los recetados solamente como se lo haya indicado el mdico.  Interrumpa el uso repetido de los [place], como se lo haya indicado el mdico. Tal interrupcin puede ser difcil. Siga atentamente las indicaciones del  mdico.  Evite los factores desencadenantes que se sabe le causan las cefaleas primarias.  Concurra a todas las visitas de control como se lo haya indicado el mdico. Esto es importante. SOLICITE ATENCIN MDICA SI:  Sigue teniendo cefaleas despus de Optometrist los tratamientos que el mdico recomend. SOLICITE ATENCIN MDICA DE INMEDIATO SI:  Tiene nuevas cefaleas.  Tiene cefaleas que son diferentes de las que tuvo en el pasado.  Tiene adormecimiento u hormigueos en los brazos o las piernas.  Tiene cambios en la visin o en el habla. Esta informacin no tiene Marine scientist el consejo del mdico. Asegrese de hacerle al mdico cualquier pregunta que tenga. Document Released: 10/08/2008 Document Revised: 06/01/2016 Document Reviewed: 12/15/2015 Elsevier Patient Education  [place] general sin causa General Headache Without Cause El dolor de cabeza es un dolor o Tree surgeon que se siente en la zona de la cabeza o del cuello. Hay muchas causas y tipos de dolores de [place]. En algunos casos, es posible que no se encuentre la causa. Siga estas indicaciones en su casa: Controle su afeccin para detectar cualquier cambio. Infrmele a su mdico acerca de los cambios. Siga estos pasos para Building surveyor afeccin: Control del [employer] medicamentos de venta libre y los recetados solamente como se lo haya indicado el mdico.  Cuando sienta dolor de cabeza acustese en un cuarto oscuro y tranquilo.  Si se lo indican, aplquese hielo en la cabeza y en la zona del cuello: ? Ponga el hielo en una bolsa plstica. ? Coloque una [employer] piel y Therapist, nutritional. ? Coloque el hielo durante 47minutos, 2a3veces al da.  Si se lo indican, aplique calor en la zona afectada. Use la fuente de calor que el mdico le recomiende, como una compresa de calor hmedo o una almohadilla trmica. ? Coloque una [employer] piel y la fuente de Freight forwarder. ?  Aplique calor durante 20 a 20minutos. ? Retire la fuente de calor si la piel se pone de color rojo brillante. Esto es muy importante si no puede Education officer, environmental, calor o fro. Puede correr un riesgo mayor de sufrir quemaduras.  [place] luces tenues si las luces brillantes le molestan o sus dolores de cabeza [place]. Comida y bebida  Mantenga un horario para las comidas.  Si bebe alcohol: ? Limite la cantidad que bebe a lo siguiente:  De 0 a 1 medida por da para las mujeres.  De 0 a 2 medidas por da para los hombres. ? Est atento a la cantidad de alcohol que hay en las bebidas que toma. En los [place], una medida equivale a una botella de cerveza de 12oz (358ml), un vaso de vino de 5oz (184ml) o un vaso de una bebida alcohlica de alta graduacin de 1oz (97ml).  Deje de tomar cafena o reduzca la cantidad que consume. Indicaciones generales   Lleve un registro diario para averiguar si ciertas cosas provocan los dolores de [place]. Registre, por ejemplo, lo siguiente: ? Lo que usted come y bebe. ? El tiempo que duerme. ? Algn cambio en su dieta o en los medicamentos.  Hgase masajes o  pruebe otras formas de relajarse.  Limite el estrs.  Sintese con la espalda recta. No contraiga (tensione) los msculos.  No consuma ningn producto que contenga nicotina o tabaco. Estos incluyen los cigarrillos, el tabaco para Theatre manager y los Administrator, Civil Service. Si necesita ayuda para dejar de fumar, consulte al mdico.  Haga ejercicios con regularidad tal como se lo indic el mdico.  Duerma lo suficiente. Esto a menudo significa entre 7 y 9horas de sueo cada noche.  Concurra a todas las visitas de control como se lo haya indicado el mdico. Esto es importante. Comunquese con un mdico si:  Los medicamentos no logran [employer].  Tiene un dolor de cabeza que es diferente a los otros dolores de [place].  Tiene malestar estomacal (nuseas) o vomita.   Tiene fiebre. Solicite ayuda inmediatamente si:  El dolor de [place] empeora rpidamente.  El dolor empeora despus de hacer mucha actividad fsica.  Sigue vomitando.  Presenta rigidez en el cuello.  Tiene dificultad para ver.  Tiene dificultad para hablar.  Siente dolor en el ojo o en el odo.  Sus msculos estn dbiles, o pierde el control muscular.  Pierde el equilibrio o tiene problemas para Advertising account planner.  Siente que va a desvanecerse (perder el conocimiento) o se desmaya.  Est desorientado (confundido).  Tiene una convulsin. Resumen  El dolor de cabeza es un dolor o Dentist que se siente en la zona de la cabeza o del cuello.  Hay muchas causas y tipos de dolores de [place]. En algunos casos, es posible que no se encuentre la causa.  Lleve un diario como ayuda para [employer] causa de los dolores de [place]. Controle su afeccin para Insurance risk surveyor cambio. Infrmele a su mdico acerca de los cambios.  Comunquese con un mdico si tiene un dolor de cabeza que es diferente de lo habitual o si el dolor de cabeza no se alivia con los medicamentos.  Solicite ayuda de inmediato si el dolor de cabeza es muy intenso, vomita, tiene dificultad para ver, pierde el equilibrio o tiene una convulsin. Esta informacin no tiene Theme park manager el consejo del mdico. Asegrese de hacerle al mdico cualquier pregunta que tenga. Document Released: 10/04/2011 Document Revised: 03/15/2018 Document Reviewed: 03/15/2018 Elsevier Patient Education  2020 Elsevier Inc.    Topiramate tablets Qu es este medicamento? El TOPIRAMATO se [place] para Chief Operating Officer las convulsiones en adultos o nios con epilepsia. Este medicamento tambin se [place] para [employer]. Este medicamento puede ser utilizado para otros usos; si tiene alguna pregunta consulte con su proveedor de atencin mdica o con su farmacutico. MARCAS COMUNES: Topamax, Topiragen Qu le debo informar a mi  profesional de la salud antes de tomar este medicamento? Necesita saber si usted presenta alguno de los [employer] o situaciones:  trastornos sanguneos  cirrosis del hgado u otra enfermedad heptica  diarrea  glaucoma  clculos renales o enfermedad renal  conteos sanguneos bajos, como baja cantidad de glbulos blancos, plaquetas o glbulos rojos  enfermedad pulmonar, como asma, enfermedad pulmonar obstructiva, enfisema  acidosis metablica  siguiendo la dieta cetognica  si est programado por una ciruga o un procedimiento  ideas suicidas, planes o intento; si usted o alguien de su familia ha intentado un suicidio previo  Runner, broadcasting/film/video o inusual al topiramato, a otros medicamentos, alimentos, colorantes o conservantes  si est embarazada o buscando quedar embarazada  si est amamantando a un beb Cmo debo [employer]? Tome este medicamento por va oral con un  vaso de agua. Siga las instrucciones de la etiqueta del [place]. No triture ni [employer]. Puede tomar [employer] con las comidas. Tome su medicamento a intervalos regulares. No lo tome con una frecuencia mayor a la indicada. Hable con su pediatra para informarse acerca del uso de este medicamento en nios. Puede requerir atencin especial. Aunque este medicamento se puede recetar a nios tan pequeos como de 2 aos de edad en casos selectos, existen precauciones que deben tomarse. Sobredosis: Pngase en contacto inmediatamente con un centro toxicolgico o una sala de urgencia si usted cree que haya tomado demasiado medicamento. ATENCIN: [employer] es solo para usted. No comparta este medicamento con nadie. Qu sucede si me olvido de una dosis? Si se olvida una dosis, tmela lo antes posible. Si falta menos de 6 horas para la prxima dosis, entonces no tome la dosis olvidada. Tome la prxima dosis a la hora regular. No tome dosis adicionales o dobles.  Qu puede interactuar con este medicamento? No tome esta medicina con ninguno de los siguientes medicamentos:  probenecid Esta medicina tambin puede interactuar con los siguientes medicamentos:  acetazolamida  alcohol  amitriptilina  aspirina o medicamentos tipo aspirina  pldoras anticoncepitvas  medicamentos para la depresin  medicamentos para las convulsiones  medicamentos que tratan o previenen cogulos sanguneos, como warfarina, enoxaparina, dalteparina, apixaban, dabigatrn y rivaroxabn  digoxina  hidroclorotiazida  litio  medicamentos para Chief Technology Officer, dormir o para Facilities manager de los msculos  metformina  metazolamida  los AINEs, medicamentos para Chief Technology Officer o inflamacin, como ibuprofeno o naproxeno  pioglitazona  risperidona Puede ser que esta lista no menciona todas las posibles interacciones. Informe a su profesional de [employer] de [employer] productos a base de hierbas, medicamentos de [place] o suplementos nutritivos que est tomando. Si usted fuma, consume bebidas alcohlicas o si utiliza drogas ilegales, indqueselo tambin a su profesional de [employer]. Algunas sustancias pueden interactuar con su medicamento. A qu debo estar atento al usar [employer] Corporation? Visite a su mdico o a su profesional de la salud para chequear su evolucin peridicamente. No suspenda el uso de [place]. Esto aumenta el riesgo de sufrir convulsiones si est tomando este medicamento para controlar la epilepsia. Use una pulsera o cadena de identificacin mdica que indique que tiene epilepsia o convulsiones y lleve una tarjeta que indique todos sus medicamentos. Este medicamento puede disminuir la sudoracin y aumentar la Arts development officer. Observe por signos de sudoracin reducido o fiebre, especialmente en nios. Evite las temperaturas extremas, baos muy caliente y saunas. Tenga cuidado con el ejercicio, especialmente en tiempos cuando hace  calor. Si nota fiebre o disminucin en sudor, comunquese con su proveedor de atencin mdica de inmediato. Debe beber lquidos en abundancia mientras est tomando [employer]. Si ha tenido clculos renales, esto le ayudar a reducir las probabilidades de que se formen clculos renales. Si tiene dolor de [place], con nuseas o vmito, y ojos o piel amarillentos, consulte a su mdico inmediatamente. Puede experimentar somnolencia, mareos o visin borrosa. No conduzca ni utilice maquinaria, ni haga nada que Scientist, research (life sciences) en estado de alerta hasta que sepa cmo le afecta este medicamento. Para reducir [employer], no se siente ni se ponga de pie con rapidez, especialmente si es un paciente de edad avanzada. El alcohol puede aumentar la somnolencia y [place]. Evite consumir bebidas alcohlicas. Si tiene visin borrosa, dolor de ojos u otros problemas oculares, Dietitian un examen ocular  de inmediato. El uso de este medicamento puede aumentar la posibilidad de Wilburt Finlaytener ideas o comportamiento suicida. Presta atencin a como usted responde al medicamento mientras est usndolo. Informe a su profesional de la salud inmediatamente de cualquier empeoramiento de humor o ideas de suicidio o de morir. Este medicamento puede incrementar la posibilidad de Environmental education officerdesarrollar acidosis metablica. Si no se trata, puede provocar clculos renales, enfermedad sea o crecimiento lento en los nios. Los sntomas incluyen respiracin rpida, fatiga, prdida del apetito, pulso cardiaco irregular o prdida del conocimiento. Si experimenta algunos de estos efectos secundarios, comunquese con su mdico inmediatamente. Adems, informe a su mdico sobre cirugas que planea tener mientras est tomando este medicamento ya que puede aumentar su riesgo de acidosis metablica. Las pldoras anticonceptivas pueden no actuar correctamente mientras est tomando PPL Corporationeste medicamento. Consulte a su mdico acerca de un mtodo  anticonceptivo adicional. Las mujeres que se encuentran embarazadas mientras usan este medicamento pueden inscribirse en el registro del [place] Antiepileptic Drug Pregnancy Registry (Registro estadounidense de Psychiatristmbarazo de Medicamentos Antiepilpticos) llamando al telfono [phone]. Este registro recoge informacin acerca de la seguridad del uso de medicamentos antiepilpticos durante el Psychiatristembarazo. Qu efectos secundarios puedo tener al Boston Scientificutilizar este medicamento? Efectos secundarios que debe informar a su mdico o a Producer, television/film/videosu profesional de la salud tan pronto como sea posible:  Therapist, artreacciones alrgicas como erupcin cutnea, picazn o urticarias, hinchazn de la cara, labios o lengua  disminucin de la sudoracin y/o aumento de la temperatura del cuerpo  depresin  dificultad para respirar; respiracin rpida o irregular  dificultad para hablar  dificultad para caminar o controlar los movimientos musculares  deficiencia auditiva  enrojecimiento, formacin de ampollas, descamacin o distensin de la piel, inclusive dentro de la boca  hormigueo, Engineer, miningdolor o entumecimiento de manos o pies  sangrado, magulladuras inusuales  cansancio o debilidad inusual  empeoramiento de humor, ideas o actos de suicidio o de morir Efectos secundarios que, por lo general, no requieren Psychologist, prison and probation servicesatencin mdica (debe informarlos a su mdico o a su profesional de la salud si persisten o si son molestos):  sentido del gusto alterado  dolor de espalda, molestias y dolores musculares o articulares  diarrea o estreimiento  dolor de cabeza  prdida del apetito  nuseas  indigestin, Programme researcher, broadcasting/film/videomalestar estomacal  temblores Puede ser que esta lista no menciona todos los posibles efectos secundarios. Comunquese a su mdico por asesoramiento mdico Hewlett-Packardsobre los efectos secundarios. Usted puede informar los efectos secundarios a la FDA por telfono al 1-800-FDA-1088. Dnde debo guardar mi medicina? Mantngala fuera del  alcance de los nios. Gurdela a Sanmina-SCItemperatura ambiente, entre 15 y 30 grados C (2159 y 4086 grados F) en un envase bien cerrado. Protjala de la humedad. Deseche todo el medicamento que no haya utilizado, despus de la fecha de vencimiento. ATENCIN: Este folleto es un resumen. Puede ser que no cubra toda la posible informacin. Si usted tiene preguntas acerca de esta medicina, consulte con su mdico, su farmacutico o su profesional de Radiographer, therapeuticla salud.  2020 Elsevier/Gold Standard (2016-08-12 00:00:00)

## 2019-05-24 NOTE — Telephone Encounter (Signed)
Patient called stating she was told by her neurologist to FU with her PCP in regards to her MRI. Please follow up.

## 2019-05-24 NOTE — Progress Notes (Signed)
PATIENT: Tracy Carson DOB: May 27, 1981  REASON FOR VISIT: follow up HISTORY FROM: patient  Chief Complaint  Patient presents with  . Follow-up    Room 2, alone. Has headaches on occasion.      HISTORY OF PRESENT ILLNESS: Today 05/24/19 Tracy Carson is a 38 y.o. female here today for follow up for chronic headaches.she reports that overall headaches have improved.  She does continue to have regular headaches but does not feel they are as intense.  She had dental work performed recently that has helped.  She tried amitriptyline but was really sleepy the next day.  She reports taking either ibuprofen, Tylenol or Aleve on a regular basis.  Sometimes she will take all 3 on the same day.  She had Nexplanon removed on 10/9.  MRI was unremarkable with the exception of maxillary sinus thickening.  She does have chronic allergy symptoms.  She is not currently treated with antihistamines.  She reports seeing an ENT provider recently.  No recommendations were made.  She has never had an eye exam.  HISTORY: (copied from Dr Richrd Humbles note on 03/28/2019)  38 year old female here for evaluation of headaches.    June 2020 patient had onset of right-sided face, neck pain and swelling.  1 week prior to onset of symptoms patient had an accident where a piece of sheet rock fell on the top of her head, causing mild pain but no significant problems.  She also had been playing with her children around that time and doing headstands which caused some pressure in the top of her head.  Then she started to have pain and headache in the right side of her head radiating to the back of the head and neck.  Sometimes associated with blurred vision and nausea, conjunctival injection and tearing.  Sometimes associated with right-sided facial sweating.  Headaches can last 15 to 20 minutes at a time.  Headaches occur on a daily basis.  Now headaches occurring twice per day.  Patient also had significant anxiety and  stress related to these headaches.  She had multiple emergency room evaluations.  No prior similar problems or headaches when she was younger.  Patient is followed up with ENT and dentist.  No specific ENT problems have been found.  She has been recommended to have multiple dental extractions due to periodontal disease bilaterally.  CT soft tissue of the neck showed nonspecific swelling of right platysma.  CTA of the head and neck was normal.  Patient is tried indomethacin without relief.  Also tried Tylenol, ibuprofen, naproxen, prednisone, antibiotics without relief.   REVIEW OF SYSTEMS: Out of a complete 14 system review of symptoms, the patient complains only of the following symptoms, headaches, seasonal allergies and all other reviewed systems are negative.  ALLERGIES: No Known Allergies  HOME MEDICATIONS: Outpatient Medications Prior to Visit  Medication Sig Dispense Refill  . methimazole (TAPAZOLE) 10 MG tablet Take 1 tablet (10 mg total) by mouth daily. 30 tablet 2  . naproxen (NAPROSYN) 500 MG tablet Take 1 tablet (500 mg total) by mouth 2 (two) times daily. 30 tablet 0  . amitriptyline (ELAVIL) 25 MG tablet Take 1 tablet (25 mg total) by mouth at bedtime. 30 tablet 3  . amoxicillin-clavulanate (AUGMENTIN) 875-125 MG tablet Take 1 tablet by mouth every 12 (twelve) hours. (Patient not taking: Reported on 05/01/2019) 20 tablet 0  . rizatriptan (MAXALT-MLT) 10 MG disintegrating tablet Take 1 tablet (10 mg total) by mouth as needed for  migraine. May repeat in 2 hours if needed 9 tablet 11   No facility-administered medications prior to visit.     PAST MEDICAL HISTORY: Past Medical History:  Diagnosis Date  . Hypertension   . Thyroid disease    hyper  . Tuberculosis    as a child    PAST SURGICAL HISTORY: Past Surgical History:  Procedure Laterality Date  . CESAREAN SECTION  07/02/2011   Procedure: CESAREAN SECTION;  Surgeon: Roseanna Rainbow, MD;  Location: [facility];   Service: Gynecology;  Laterality: N/A;  . CHOLECYSTECTOMY  2002    FAMILY HISTORY: Family History  Problem Relation Age of Onset  . Diabetes Mother   . Hypertension Father   . Thyroid disease Paternal Grandmother   . Thyroid disease Sister   . Thyroid disease Brother     SOCIAL HISTORY: Social History   Socioeconomic History  . Marital status: Married    Spouse name: Blossom Hoops  . Number of children: 4  . Years of education: 7  . Highest education level: Not on file  Occupational History    Comment: na  Social Needs  . Financial resource strain: Not on file  . Food insecurity    Worry: Not on file    Inability: Not on file  . Transportation needs    Medical: Not on file    Non-medical: Not on file  Tobacco Use  . Smoking status: Current Some Day Smoker    Packs/day: 5.00    Years: 1.00    Pack years: 5.00    Types: Cigars  . Smokeless tobacco: Former Neurosurgeon  . Tobacco comment: 03/28/19 7-8/day  Substance and Sexual Activity  . Alcohol use: No    Comment: quit 10/2018  . Drug use: No  . Sexual activity: Yes    Birth control/protection: Implant  Lifestyle  . Physical activity    Days per week: Not on file    Minutes per session: Not on file  . Stress: Not on file  Relationships  . Social Musician on phone: Not on file    Gets together: Not on file    Attends religious service: Not on file    Active member of club or organization: Not on file    Attends meetings of clubs or organizations: Not on file    Relationship status: Not on file  . Intimate partner violence    Fear of current or ex partner: Not on file    Emotionally abused: Not on file    Physically abused: Not on file    Forced sexual activity: Not on file  Other Topics Concern  . Not on file  Social History Narrative   Lives with spouse, 4 daughters   Some caffeine      PHYSICAL EXAM  Vitals:   05/24/19 0926  BP: 117/82  Pulse: 75  Temp: 98 F (36.7 C)  Weight: 167 lb  (75.8 kg)  Height: 5\' 2"  (1.575 m)   Body mass index is 30.54 kg/m.  Generalized: Well developed, in no acute distress  Cardiology: normal rate and rhythm, no murmur noted Neurological examination  Mentation: Alert oriented to time, place, history taking. Follows all commands speech and language fluent Cranial nerve II-XII: Pupils were equal round reactive to light. Extraocular movements were full, visual field were full on confrontational test. Facial sensation and strength were normal. Uvula tongue midline. Head turning and shoulder shrug  were normal and symmetric. Motor: The motor testing reveals  5 over 5 strength of all 4 extremities. Good symmetric motor tone is noted throughout.  Gait and station: Gait is normal.   DIAGNOSTIC DATA (LABS, IMAGING, TESTING) - I reviewed patient records, labs, notes, testing and imaging myself where available.  No flowsheet data found.   Lab Results  Component Value Date   WBC 7.7 05/16/2019   HGB 16.6 (H) 05/16/2019   HCT 45.1 05/16/2019   MCV 87.4 05/16/2019   PLT 230 05/16/2019      Component Value Date/Time   NA 135 05/16/2019 1648   K 3.6 05/16/2019 1648   CL 104 05/16/2019 1648   CO2 21 (L) 05/16/2019 1648   GLUCOSE 100 (H) 05/16/2019 1648   BUN 10 05/16/2019 1648   CREATININE 0.47 05/16/2019 1648   CREATININE 0.41 (L) 06/27/2014 1150   CALCIUM 9.3 05/16/2019 1648   PROT 7.2 01/06/2019 1308   ALBUMIN 3.8 01/06/2019 1308   AST 25 01/06/2019 1308   ALT 27 01/06/2019 1308   ALKPHOS 122 01/06/2019 1308   BILITOT 0.5 01/06/2019 1308   GFRNONAA >60 05/16/2019 1648   GFRNONAA >89 05/16/2014 1204   GFRAA >60 05/16/2019 1648   GFRAA >89 05/16/2014 1204   Lab Results  Component Value Date   CHOL 112 11/15/2016   HDL 24 (L) 11/15/2016   LDLCALC 56 11/15/2016   TRIG 162 (H) 11/15/2016   CHOLHDL 4.7 (H) 11/15/2016   Lab Results  Component Value Date   HGBA1C 5.2 11/15/2016   Lab Results  Component Value Date   VITAMINB12  1,137 (H) 11/03/2012   Lab Results  Component Value Date   TSH <0.01 (L) 04/10/2019       ASSESSMENT AND PLAN 38 y.o. year old female  has a past medical history of Hypertension, Thyroid disease, and Tuberculosis. here with     ICD-10-CM   1. Nonintractable headache, unspecified chronicity pattern, unspecified headache type  R51.9 Ambulatory referral to Ophthalmology    Jykeria reports that headaches have improved somewhat since last being seen in September.  Unfortunately, headaches do continue to persist.  We have discussed several potential factors.  I will refer her to ophthalmology for evaluation.  MRI was normal with the exception of maxillary sinus thickening.  She will discuss concerns of allergies with primary care provider.  We have discussed concerns of rebound headaches.  I have advised that she wean over-the-counter medications.  Limit the use to 1-2 times weekly.  I will start topiramate 25 mg twice daily.  Potential side effects reviewed and additional information provided in AVS.  Adequate hydration and healthy diet advised.  She will follow-up with me in 2 months, sooner if needed.  She verbalizes understanding and agreement with this plan.   Orders Placed This Encounter  Procedures  . Ambulatory referral to Ophthalmology    Referral Priority:   Routine    Referral Type:   Consultation    Referral Reason:   Specialty Services Required    Requested Specialty:   Ophthalmology    Number of Visits Requested:   1     Meds ordered this encounter  Medications  . topiramate (TOPAMAX) 25 MG tablet    Sig: Take 1 tablet (25 mg total) by mouth 2 (two) times daily.    Dispense:  180 tablet    Refill:  0    Order Specific Question:   Supervising Provider    Answer:   Anson Fret [id number]      I spent 15  minutes with the patient. 50% of this time was spent counseling and educating patient on plan of care and medications.    Shawnie Dapper, FNP-C 05/24/2019, 12:49 PM  [facility] [address] [place], [place] [address] [phone]

## 2019-05-24 NOTE — Telephone Encounter (Signed)
Patient was called and given results from MRI ordred back on 04/12/2019.

## 2019-05-25 LAB — T4, FREE: Free T4: 0.82 ng/dL (ref 0.60–1.60)

## 2019-05-25 LAB — T3, FREE: T3, Free: 3.2 pg/mL (ref 2.3–4.2)

## 2019-05-28 ENCOUNTER — Other Ambulatory Visit: Payer: Self-pay

## 2019-05-28 ENCOUNTER — Encounter (HOSPITAL_COMMUNITY): Payer: Self-pay | Admitting: Emergency Medicine

## 2019-05-28 ENCOUNTER — Emergency Department (HOSPITAL_COMMUNITY)
Admission: EM | Admit: 2019-05-28 | Discharge: 2019-05-28 | Disposition: A | Discharge status: Home / Self Care | Payer: Self-pay | Attending: Emergency Medicine | Admitting: Emergency Medicine

## 2019-05-28 DIAGNOSIS — I10 Essential (primary) hypertension: Secondary | ICD-10-CM | POA: Insufficient documentation

## 2019-05-28 DIAGNOSIS — R519 Headache, unspecified: Secondary | ICD-10-CM | POA: Insufficient documentation

## 2019-05-28 DIAGNOSIS — Z79899 Other long term (current) drug therapy: Secondary | ICD-10-CM | POA: Insufficient documentation

## 2019-05-28 DIAGNOSIS — F17290 Nicotine dependence, other tobacco product, uncomplicated: Secondary | ICD-10-CM | POA: Insufficient documentation

## 2019-05-28 LAB — CBC WITH DIFFERENTIAL/PLATELET
Abs Immature Granulocytes: 0.01 10*3/uL (ref 0.00–0.07)
Basophils Absolute: 0 10*3/uL (ref 0.0–0.1)
Basophils Relative: 1 %
Eosinophils Absolute: 0.2 10*3/uL (ref 0.0–0.5)
Eosinophils Relative: 3 %
HCT: 43.5 % (ref 36.0–46.0)
Hemoglobin: 15.9 g/dL — ABNORMAL HIGH (ref 12.0–15.0)
Immature Granulocytes: 0 %
Lymphocytes Relative: 30 %
Lymphs Abs: 1.7 10*3/uL (ref 0.7–4.0)
MCH: 32.1 pg (ref 26.0–34.0)
MCHC: 36.6 g/dL — ABNORMAL HIGH (ref 30.0–36.0)
MCV: 87.9 fL (ref 80.0–100.0)
Monocytes Absolute: 0.3 10*3/uL (ref 0.1–1.0)
Monocytes Relative: 6 %
Neutro Abs: 3.3 10*3/uL (ref 1.7–7.7)
Neutrophils Relative %: 60 %
Platelets: 241 10*3/uL (ref 150–400)
RBC: 4.95 MIL/uL (ref 3.87–5.11)
RDW: 11.4 % — ABNORMAL LOW (ref 11.5–15.5)
WBC: 5.5 10*3/uL (ref 4.0–10.5)
nRBC: 0 % (ref 0.0–0.2)

## 2019-05-28 LAB — COMPREHENSIVE METABOLIC PANEL
ALT: 25 U/L (ref 0–44)
AST: 21 U/L (ref 15–41)
Albumin: 4.3 g/dL (ref 3.5–5.0)
Alkaline Phosphatase: 107 U/L (ref 38–126)
Anion gap: 13 (ref 5–15)
BUN: 5 mg/dL — ABNORMAL LOW (ref 6–20)
CO2: 20 mmol/L — ABNORMAL LOW (ref 22–32)
Calcium: 9.2 mg/dL (ref 8.9–10.3)
Chloride: 103 mmol/L (ref 98–111)
Creatinine, Ser: 0.72 mg/dL (ref 0.44–1.00)
GFR calc Af Amer: >60 mL/min (ref >60)
GFR calc non Af Amer: >60 mL/min (ref >60)
Glucose, Bld: 134 mg/dL — ABNORMAL HIGH (ref 70–99)
Potassium: 3.9 mmol/L (ref 3.5–5.1)
Sodium: 136 mmol/L (ref 135–145)
Total Bilirubin: 0.5 mg/dL (ref 0.3–1.2)
Total Protein: 7.3 g/dL (ref 6.5–8.1)

## 2019-05-28 MED ORDER — LORAZEPAM 2 MG/ML IJ SOLN
0.5000 mg | Freq: Once | INTRAMUSCULAR | Status: AC
  Administered 2019-05-28: 0.5 mg via INTRAVENOUS
  Filled 2019-05-28: qty 1

## 2019-05-28 MED ORDER — DIPHENHYDRAMINE HCL 50 MG/ML IJ SOLN
25.0000 mg | Freq: Once | INTRAMUSCULAR | Status: AC
  Administered 2019-05-28: 15:00:00 25 mg via INTRAVENOUS
  Filled 2019-05-28: qty 1

## 2019-05-28 MED ORDER — PROCHLORPERAZINE EDISYLATE 10 MG/2ML IJ SOLN
10.0000 mg | Freq: Once | INTRAMUSCULAR | Status: AC
  Administered 2019-05-28: 15:00:00 10 mg via INTRAVENOUS
  Filled 2019-05-28: qty 2

## 2019-05-28 MED ORDER — SODIUM CHLORIDE 0.9 % IV BOLUS
1000.0000 mL | Freq: Once | INTRAVENOUS | Status: AC
  Administered 2019-05-28: 15:00:00 1000 mL via INTRAVENOUS

## 2019-05-28 NOTE — ED Provider Notes (Addendum)
  Physical Exam  BP (!) 143/96 (BP Location: Right Arm)   Pulse 98   Temp 98.1 F (36.7 C) (Oral)   Resp 16   LMP 05/27/2019   SpO2 99%   Physical Exam  ED Course/Procedures   Clinical Course as of May 27 1557  Mon May 28, 2019  1533 Ongoing headaches since June. Workup in the past with neuro, ENT including MRI, CTA. On topamax from neurologist but has not picked up rx yet. Given migraine cocktail today. Scheduled to see ophthalmologist as well. Needs recheck after migraine cocktail.   [KM]    Clinical Course User Index [KM] Alveria Apley, PA-C    Procedures  MDM  Patient care assumed from [facility] due to change of shift.  Patient reports that she is feeling better after migraine cocktail.  Discussed lab results and that since she is improved I do not see the need for any emergent imaging today.  Has had extensive work-up in the past which has been unremarkable and is due to see the ophthalmologist in 2 months.  Patient has not started her Topamax yet but says she can pick it up from the pharmacy today.  Patient agrees with being discharged home and starting her Topamax today and following up with ophthalmologist and neurologist. She is not having any CP, SOB, blurry vision Patient stable for d/c      Kristine Royal 05/28/19 1557    Alveria Apley, PA-C 05/28/19 1558    Drenda Freeze, MD 05/29/19 1328

## 2019-05-28 NOTE — ED Provider Notes (Signed)
[facility] EMERGENCY DEPARTMENT Provider Note   CSN: [id number] Arrival date & time: 05/28/19  1315     History   Chief Complaint Chief Complaint  Patient presents with  . Hypertension    HPI Tracy Carson is a 38 y.o. female.     Patient with history of ongoing headaches presents to the emergency department with complaint of headache that is been ongoing for several weeks, worse today.  Patient has had extensive work-up for her headaches including CT angiography of the head and neck, MRI of the brain.  She has seen neurology.  She has been trialed on amitriptyline in the past.  She was recently prescribed topiramate which she has not yet started.  She reports blurry vision.  No confusion or fevers.  She does have some pain that radiates into the neck but denies decreased range of motion of the neck.  Patient denies any chest pain or shortness of breath.  No treatments prior to arrival.  The onset of this condition was chronic. The course is constant. Aggravating factors: light. Alleviating factors: none.       Past Medical History:  Diagnosis Date  . Hypertension   . Thyroid disease    hyper  . Tuberculosis    as a child    Patient Active Problem List   Diagnosis Date Noted  . 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  . History of blurry vision 05/20/2014  . History of itching of eye 05/20/2014  . Other fatigue 05/20/2014  . Tobacco dependence 05/20/2014  . Palpitations 05/20/2014  . Abdominal pain 11/03/2012  . 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 Tachycardia 11/03/2012  . Hematochezia 11/03/2012  . Graves disease 11/03/2012  . Anemia 11/03/2012  . Breech presentation 07/02/2011  . Cesarean delivery, without mention of indication, delivered, with or without mention of antepartum condition 07/02/2011    Past Surgical History:  Procedure Laterality Date  . CESAREAN SECTION  07/02/2011   Procedure: CESAREAN SECTION;  Surgeon: Agnes Lawrence, MD;  Location: [facility];  Service: Gynecology;  Laterality: N/A;  . CHOLECYSTECTOMY  2002     OB History    Gravida  4   Para  4   Term  4   Preterm      AB      Living  4     SAB      TAB      Ectopic      Multiple      Live Births  3            Home Medications    Prior to Admission medications   Medication Sig Start Date End Date Taking? Authorizing Provider  methimazole (TAPAZOLE) 10 MG tablet Take 1 tablet (10 mg total) by mouth daily. 04/13/19   Elayne Snare, MD  naproxen (NAPROSYN) 500 MG tablet Take 1 tablet (500 mg total) by mouth 2 (two) times daily. 03/10/19   Jaynee Eagles, PA-C  topiramate (TOPAMAX) 25 MG tablet Take 1 tablet (25 mg total) by mouth 2 (two) times daily. 05/24/19   Lomax, Amy, NP    Family History Family History  Problem Relation Age of Onset  . Diabetes Mother   . Hypertension Father   . Thyroid disease Paternal Grandmother   . Thyroid disease Sister   . Thyroid disease Brother     Social History Social History   Tobacco Use  . Smoking status: Current Some Day Smoker    Packs/day: 5.00    Years: 1.00  Pack years: 5.00    Types: Cigars  . Smokeless tobacco: Former NeurosurgeonUser  . Tobacco comment: 03/28/19 7-8/day  Substance Use Topics  . Alcohol use: No    Comment: quit 10/2018  . Drug use: No     Allergies   Patient has no known allergies.   Review of Systems Review of Systems  Constitutional: Negative for fever.  HENT: Negative for congestion, dental problem, rhinorrhea and sinus pressure.   Eyes: Positive for photophobia and visual disturbance. Negative for discharge and redness.  Respiratory: Negative for shortness of breath.   Cardiovascular: Negative for chest pain.  Gastrointestinal: Negative for nausea and vomiting.  Musculoskeletal: Positive for neck pain. Negative for gait problem and neck stiffness.  Skin: Negative for rash.  Neurological: Positive for headaches. Negative for syncope, speech difficulty, weakness,  light-headedness and numbness.  Psychiatric/Behavioral: Negative for confusion.     Physical Exam Updated Vital Signs BP 110/78   Pulse 68   Temp 98.1 F (36.7 C) (Oral)   Resp 14   LMP 05/27/2019   SpO2 98%   Physical Exam Vitals signs and nursing note reviewed.  Constitutional:      General: She is in acute distress.     Appearance: She is well-developed.     Comments: Patient is tearful in discussing her symptoms  HENT:     Head: Normocephalic and atraumatic.     Right Ear: Tympanic membrane, ear canal and external ear normal.     Left Ear: Tympanic membrane, ear canal and external ear normal.     Nose: Nose normal.     Mouth/Throat:     Pharynx: Uvula midline.  Eyes:     General: Lids are normal.     Extraocular Movements:     Right eye: No nystagmus.     Left eye: No nystagmus.     Conjunctiva/sclera: Conjunctivae normal.     Pupils: Pupils are equal, round, and reactive to light.  Neck:     Musculoskeletal: Normal range of motion and neck supple.  Cardiovascular:     Rate and Rhythm: Normal rate and regular rhythm.  Pulmonary:     Effort: Pulmonary effort is normal.     Breath sounds: Normal breath sounds.  Abdominal:     Palpations: Abdomen is soft.     Tenderness: There is no abdominal tenderness.  Musculoskeletal:     Cervical back: She exhibits normal range of motion, no tenderness and no bony tenderness.  Skin:    General: Skin is warm and dry.  Neurological:     Mental Status: She is alert and oriented to person, place, and time.     GCS: GCS eye subscore is 4. GCS verbal subscore is 5. GCS motor subscore is 6.     Cranial Nerves: No cranial nerve deficit.     Sensory: No sensory deficit.     Coordination: Coordination normal.     Gait: Gait normal.     Deep Tendon Reflexes: Reflexes are normal and symmetric.      ED Treatments / Results  Labs (all labs ordered are listed, but only abnormal results are displayed) Labs Reviewed   COMPREHENSIVE METABOLIC PANEL - Abnormal; Notable for the following components:      Result Value   CO2 20 (*)    Glucose, Bld 134 (*)    BUN 5 (*)    All other components within normal limits  CBC WITH DIFFERENTIAL/PLATELET - Abnormal; Notable for the following components:  Hemoglobin 15.9 (*)    MCHC 36.6 (*)    RDW 11.4 (*)    All other components within normal limits    EKG None  Radiology No results found.  Procedures Procedures (including critical care time)  Medications Ordered in ED Medications  prochlorperazine (COMPAZINE) injection 10 mg (10 mg Intravenous Given 05/28/19 1502)  diphenhydrAMINE (BENADRYL) injection 25 mg (25 mg Intravenous Given 05/28/19 1502)  LORazepam (ATIVAN) injection 0.5 mg (0.5 mg Intravenous Given 05/28/19 1503)  sodium chloride 0.9 % bolus 1,000 mL (1,000 mLs Intravenous New Bag/Given 05/28/19 1503)     Initial Impression / Assessment and Plan / ED Course  I have reviewed the triage vital signs and the nursing notes.  Pertinent labs & imaging results that were available during my care of the patient were reviewed by me and considered in my medical decision making (see chart for details).  Clinical Course as of May 27 1602  Mon May 28, 2019  8469 Ongoing headaches since June. Workup in the past with neuro, ENT including MRI, CTA. On topamax from neurologist but has not picked up rx yet. Given migraine cocktail today. Scheduled to see ophthalmologist as well. Needs recheck after migraine cocktail.   [KM]    Clinical Course User Index [KM] Arlyn Dunning, PA-C       Patient seen and examined. Work-up initiated. Medications ordered.  Spent time reviewing patient's previous work-up including imaging and neurology notes.  She has been referred to ophthalmology but has not yet seen them.  I do not feel that repeat imaging is indicated today, symptoms are the same as previous.  Vital signs reviewed and are as follows: BP 110/78   Pulse 68    Temp 98.1 F (36.7 C) (Oral)   Resp 14   LMP 05/27/2019   SpO2 98%   Signout to [employer] at shift change.  She will reassess for improvement after migraine cocktail.    Final Clinical Impressions(s) / ED Diagnoses   Final diagnoses:  Bad headache   Pending completion of work-up.  No obvious signs of meningitis today.  Normal and reassuring neuro exam.  Patient is concerned about her blood pressure however it is not very elevated today.  Do not feel that repeat imaging is indicated at this time.  ED Discharge Orders    None       Renne Crigler, PA-C 05/28/19 1607    Arby Barrette, MD 05/29/19 [phone]

## 2019-05-28 NOTE — ED Triage Notes (Signed)
Pt states she feels like her bp is high. She has a headache and light headed. Pt was diagnosed with htn 3 years ago but not placed on any medication.

## 2019-05-28 NOTE — Discharge Instructions (Addendum)
Start taking your topamax as prescribed by neurology. Thank you for allowing me to care for you today. Please return to the emergency department if you have new or worsening symptoms. Take your medications as instructed.

## 2019-05-29 NOTE — Progress Notes (Signed)
Patient ID: Tracy Carson, female   DOB: 01-23-1981, 38 y.o.   MRN: [id number]                                                                                                               Reason for Appointment:  Hyperthyroidism, follow-up visit  Today's office visit was provided via telemedicine using video technique The patient was explained the limitations of evaluation and management by telemedicine and the availability of in person appointments.  The patient understood the limitations and agreed to proceed. Patient also understood that the telehealth visit is billable. . Location of the patient: Patient's home . Location of the provider: Physician office Only the patient and myself were participating in the encounter    Chief complaint: Follow-up of thyroid   History of Present Illness:   Prior history: Her hyperthyroidism was diagnosed in 2012 before a pregnancy At that time she had symptoms of palpitations, shakiness, feeling excessively warm, difficulty sleeping, increased appetite and hair loss She was treated with methimazole She was recommended I-131 treatment in 2014 but even though she had an uptake test done she did not go for the treatment Subsequently has been very irregular with her follow-up program and medications She says she was taking her methimazole 2 tablets twice a day every third day or so because of cost Because of worsening symptoms of palpitations, feeling excessively hot and losing weight she presented to the emergency room on 11/08/16 and was admitted for 2 days  On her initial consultation in her free T4 was still very high at 3.04 and she was having problems with fatigue, weakness and heat intolerance along with tachycardia. Her methimazole was increased to 3 tablets twice a day initially   She finally agreed to do that I-131 treatment when she was seen in June and this was done with 19.5 mCi on 02/16/17  RECENT history  In October 2018 she had  normal thyroid functions and had no complaints She was told to come back in about 2 months but she did not  She went to the emergency room in June 2020 and she thought she had swollen glands in her neck along with anxiety and palpitations At that time her thyroid levels showed hyperthyroidism and free T4 was significantly high at 2.3 She was started on methimazole 10 mg twice daily by her PCP on 01/11/2019  On her initial consultation back in August 2020 with her labs being upper normal and still she was having symptoms she was told to take 10 mg alternating with 5 mg methimazole instead of 5 mg daily In September she was told to increase this further to 10 mg which she is taking now  However she still says that she gets hot and sweaty and has some shakiness and fatigue Her weight is down 5 pounds since September She has not missed any doses of methimazole  Her free T3 is now back to normal as also free T4, TSH still suppressed  She has been reluctant to do the  I-131 treatment again   Wt Readings from Last 3 Encounters:  05/24/19 167 lb (75.8 kg)  05/01/19 166 lb (75.3 kg)  03/28/19 172 lb 6.4 oz (78.2 kg)     Thyroid function tests as follows:     Lab Results  Component Value Date   FREET4 0.82 05/24/2019   FREET4 1.61 (H) 04/10/2019   FREET4 1.34 02/26/2019   T3FREE 3.2 05/24/2019   T3FREE 4.3 (H) 04/10/2019   T3FREE 4.2 02/26/2019   TSH <0.01 (L) 04/10/2019   TSH <0.010 (L) 01/06/2019   TSH <0.01 (L) 05/13/2017    No results found for: THYROTRECAB   Allergies as of 05/30/2019   No Known Allergies     Medication List       Accurate as of May 29, 2019  9:03 PM. If you have any questions, ask your nurse or doctor.        methimazole 10 MG tablet Commonly known as: TAPAZOLE Take 1 tablet (10 mg total) by mouth daily.   naproxen 500 MG tablet Commonly known as: NAPROSYN Take 1 tablet (500 mg total) by mouth 2 (two) times daily.   topiramate 25 MG tablet  Commonly known as: TOPAMAX Take 1 tablet (25 mg total) by mouth 2 (two) times daily.           Past Medical History:  Diagnosis Date  . Hypertension   . Thyroid disease    hyper  . Tuberculosis    as a child    Past Surgical History:  Procedure Laterality Date  . CESAREAN SECTION  07/02/2011   Procedure: CESAREAN SECTION;  Surgeon: Roseanna Rainbow, MD;  Location: [facility];  Service: Gynecology;  Laterality: N/A;  . CHOLECYSTECTOMY  2002    Family History  Problem Relation Age of Onset  . Diabetes Mother   . Hypertension Father   . Thyroid disease Paternal Grandmother   . Thyroid disease Sister   . Thyroid disease Brother     Social History:  reports that she has been smoking cigars. She has a 5.00 pack-year smoking history. She has quit using smokeless tobacco. She reports that she does not drink alcohol or use drugs.  Allergies: No Known Allergies   Review of Systems  She has had periodic trips to the emergency room for various reasons  She recently has had problems with headaches Also she is complaining about periodic palpitations and at times when she does not feel well she has checked her blood pressure and it has been as much as 140-150 systolic and up to 90 diastolic However blood pressure readings and facilities have not been consistently high  BP Readings from Last 3 Encounters:  05/28/19 110/78  05/24/19 117/82  05/17/19 124/90      Examination:   LMP 05/27/2019       Assessment/Plan:   Graves' disease, treated with radioactive iodine in July 2018  She is being treated for a recurrence of her hyperthyroidism With 10 mg of methimazole her thyroid levels are back to normal  She has nonspecific symptoms of shakiness, heat intolerance and fatigue as well as some palpitations  Likely she has the symptoms related to other problems and not the thyroid She will continue to take 10 mg methimazole Again asked her about doing I-131 treatment since  she is having persistent hyperthyroidism and has had a recurrence but she is reluctant to do this now on  For her periodic increase in blood pressure and symptoms of shakiness and palpitations  will have her start 25 mg of metoprolol ER and follow-up with PCP    There are no Patient Instructions on file for this visit.   Reather Littler 05/29/2019, 9:03 PM    Note: This office note was prepared with Dragon voice recognition system technology. Any transcriptional errors that result from this process are unintentional.

## 2019-05-30 ENCOUNTER — Encounter: Payer: Self-pay | Admitting: Endocrinology

## 2019-05-30 ENCOUNTER — Other Ambulatory Visit: Payer: Self-pay

## 2019-05-30 ENCOUNTER — Ambulatory Visit (INDEPENDENT_AMBULATORY_CARE_PROVIDER_SITE_OTHER): Payer: Self-pay | Admitting: Endocrinology

## 2019-05-30 DIAGNOSIS — E0590 Thyrotoxicosis, unspecified without thyrotoxic crisis or storm: Secondary | ICD-10-CM

## 2019-05-30 DIAGNOSIS — E0500 Thyrotoxicosis with diffuse goiter without thyrotoxic crisis or storm: Secondary | ICD-10-CM

## 2019-05-30 MED ORDER — METOPROLOL SUCCINATE ER 25 MG PO TB24: 25.0000 mg | ORAL_TABLET | Freq: Every day | ORAL | 0 refills | Status: DC

## 2019-05-30 MED ORDER — METHIMAZOLE 10 MG PO TABS: 10.0000 mg | ORAL_TABLET | Freq: Every day | ORAL | 2 refills | Status: DC

## 2019-06-06 ENCOUNTER — Encounter (HOSPITAL_COMMUNITY): Payer: Self-pay

## 2019-06-06 ENCOUNTER — Ambulatory Visit (HOSPITAL_COMMUNITY)
Admission: EM | Admit: 2019-06-06 | Discharge: 2019-06-06 | Disposition: A | Discharge status: Home / Self Care | Payer: Self-pay

## 2019-06-06 ENCOUNTER — Emergency Department (HOSPITAL_COMMUNITY): Admission: EM | Admit: 2019-06-06 | Discharge: 2019-06-06 | Discharge status: Against Medical Advice | Payer: Self-pay

## 2019-06-06 ENCOUNTER — Other Ambulatory Visit: Payer: Self-pay

## 2019-06-06 DIAGNOSIS — M542 Cervicalgia: Secondary | ICD-10-CM

## 2019-06-06 DIAGNOSIS — I10 Essential (primary) hypertension: Secondary | ICD-10-CM

## 2019-06-06 DIAGNOSIS — E0500 Thyrotoxicosis with diffuse goiter without thyrotoxic crisis or storm: Secondary | ICD-10-CM

## 2019-06-06 DIAGNOSIS — G44209 Tension-type headache, unspecified, not intractable: Secondary | ICD-10-CM

## 2019-06-06 NOTE — ED Provider Notes (Signed)
MRN: [id number] DOB: 27-Aug-1980  Subjective:   Tracy Carson is a 38 y.o. female presenting for recheck on ongoing neck pain that radiates up toward her posterior head and intermittent frontal headaches.  Has had intermittent episodes of shaking, tingling of her legs.  Last office visit with me was on 03/10/2019 for the same.  We treated her for dental caries, has had dental extractions since then.  Since August she has had multiple office visits for persistent headaches and has seen multiple providers, had CT of the neck and MRI of the brain.  Findings were equivocal except for maxillary sinus thickening.  She has since seen her PCP and had her thyroid levels checked along with other blood work.  Her T4 and T3 has been up and down.  Patient is very consistent with her methimazole, currently on 10 mg and has frequent follow-up with her PCP.  They have discussed referral to ophthalmology given her persistent headaches.  Patient hydrates with 2 bottles of water per day, sometimes drinks a soda.  Patient does not drink coffee consistently.  She does not drink alcohol.  Patient was smoking but she quit.  No current facility-administered medications for this encounter.   Current Outpatient Medications:  .  methimazole (TAPAZOLE) 10 MG tablet, Take 1 tablet (10 mg total) by mouth daily., Disp: 30 tablet, Rfl: 2 .  metoprolol succinate (TOPROL-XL) 25 MG 24 hr tablet, Take 1 tablet (25 mg total) by mouth daily., Disp: 30 tablet, Rfl: 0 .  naproxen (NAPROSYN) 500 MG tablet, Take 1 tablet (500 mg total) by mouth 2 (two) times daily., Disp: 30 tablet, Rfl: 0 .  topiramate (TOPAMAX) 25 MG tablet, Take 1 tablet (25 mg total) by mouth 2 (two) times daily., Disp: 180 tablet, Rfl: 0   No Known Allergies  Past Medical History:  Diagnosis Date  . Hypertension   . Thyroid disease    hyper  . 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 CESAREAN SECTION  07/02/2011   Procedure: CESAREAN SECTION;  Surgeon: Roseanna Rainbow, MD;  Location: [facility];  Service: Gynecology;  Laterality: N/A;  . CHOLECYSTECTOMY  2002    Family History  Problem Relation Age of Onset  . Diabetes Mother   . Hypertension Father   . Thyroid disease Paternal Grandmother   . Thyroid disease Sister   . Thyroid disease Brother     Social History   Tobacco Use  . Smoking status: Current Some Day Smoker    Packs/day: 5.00    Years: 1.00    Pack years: 5.00    Types: Cigars  . Smokeless tobacco: Former Neurosurgeon  . Tobacco comment: 03/28/19 7-8/day  Substance Use Topics  . Alcohol use: No    Comment: quit 10/2018  . Drug use: No    ROS   Objective:   Vitals: BP 128/74 (BP Location: Right Arm)   Pulse 72   Temp (!) 97.4 F (36.3 C) (Temporal)   Resp 16   Wt 167 lb (75.8 kg)   LMP 04/26/2019   SpO2 100%   BMI 30.54 kg/m   Physical Exam Constitutional:      General: She is not in acute distress.    Appearance: Normal appearance. She is well-developed. She is not ill-appearing, toxic-appearing or diaphoretic.  HENT:     Head: Normocephalic and atraumatic.     Right Ear: Tympanic membrane normal.  Left Ear: Tympanic membrane normal.     Nose: Nose normal. No congestion or rhinorrhea.     Comments: Nasal turbinates boggy.    Mouth/Throat:     Mouth: Mucous membranes are moist.     Pharynx: No oropharyngeal exudate or posterior oropharyngeal erythema.  Eyes:     General: No scleral icterus.       Right eye: No discharge.        Left eye: No discharge.     Extraocular Movements: Extraocular movements intact.     Pupils: Pupils are equal, round, and reactive to light.  Cardiovascular:     Rate and Rhythm: Normal rate and regular rhythm.     Pulses: Normal pulses.     Heart sounds: Normal heart sounds. No murmur. No friction rub. No gallop.   Pulmonary:     Effort: Pulmonary effort is normal. No respiratory distress.     Breath sounds: Normal breath  sounds. No stridor. No wheezing, rhonchi or rales.  Skin:    General: Skin is warm and dry.     Findings: No rash.  Neurological:     Mental Status: She is alert and oriented to person, place, and time.     Cranial Nerves: No cranial nerve deficit.     Motor: No weakness.     Coordination: Coordination normal.     Gait: Gait normal.     Deep Tendon Reflexes: Reflexes normal.  Psychiatric:        Mood and Affect: Mood is anxious.        Behavior: Behavior normal.        Thought Content: Thought content normal.        Judgment: Judgment normal.      Assessment and Plan :   1. Neck pain   2. Graves disease   3. Essential hypertension   4. Tension headache     Discussed etiologies for her neck pain headaches but I suspect combination of possible allergic rhinitis, difficult to regulate Graves' disease.  Recommended patient start metoprolol as previously prescribed by her PCP especially in light of her concerns regarding her blood pressure.  Recommended patient follow-up with her PCP and look to obtain consult with an ophthalmologist.  In the meantime, she can start allergy medications and revisit this with her PCP.  Use NSAID and/or Tylenol for supportive care of headaches. Counseled patient on potential for adverse effects with medications prescribed/recommended today, ER and return-to-clinic precautions discussed, patient verbalized understanding.    Wallis Bamberg, PA-C 06/06/19 1515

## 2019-06-06 NOTE — ED Triage Notes (Signed)
Pt states she has neck pain and has B/P issues.  This has been going on for months.

## 2019-06-27 ENCOUNTER — Ambulatory Visit: Payer: Self-pay | Admitting: Family Medicine

## 2019-07-03 ENCOUNTER — Encounter: Payer: Self-pay | Admitting: Family Medicine

## 2019-07-03 ENCOUNTER — Ambulatory Visit: Payer: Self-pay | Attending: Family Medicine | Admitting: Family Medicine

## 2019-07-03 ENCOUNTER — Other Ambulatory Visit: Payer: Self-pay

## 2019-07-03 VITALS — BP 106/72 | HR 61 | Temp 98.1°F | Ht 62.0 in | Wt 169.0 lb

## 2019-07-03 DIAGNOSIS — Z0000 Encounter for general adult medical examination without abnormal findings: Secondary | ICD-10-CM

## 2019-07-03 DIAGNOSIS — Z124 Encounter for screening for malignant neoplasm of cervix: Secondary | ICD-10-CM

## 2019-07-03 MED ORDER — METOPROLOL SUCCINATE ER 25 MG PO TB24: 25.0000 mg | ORAL_TABLET | Freq: Every day | ORAL | 2 refills | Status: DC

## 2019-07-03 NOTE — Patient Instructions (Signed)
Mantenimiento de la salud en las mujeres Health Maintenance, Female Adoptar un estilo de vida saludable y recibir atencin preventiva son importantes para promover la salud y el bienestar. Consulte al mdico sobre:  El esquema adecuado para hacerse pruebas y exmenes peridicos.  Cosas que puede hacer por su cuenta para prevenir enfermedades y mantenerse sana. Qu debo saber sobre la dieta, el peso y el ejercicio? Consuma una dieta saludable   Consuma una dieta que incluya muchas verduras, frutas, productos lcteos con bajo contenido de grasa y protenas magras.  No consuma muchos alimentos ricos en grasas slidas, azcares agregados o sodio. Mantenga un peso saludable El ndice de masa muscular (IMC) se utiliza para identificar problemas de peso. Proporciona una estimacin de la grasa corporal basndose en el peso y la altura. Su mdico puede ayudarle a determinar su IMC y a lograr o mantener un peso saludable. Haga ejercicio con regularidad Haga ejercicio con regularidad. Esta es una de las prcticas ms importantes que puede hacer por su salud. La mayora de los adultos deben seguir estas pautas:  Realizar, al menos, 150minutos de actividad fsica por semana. El ejercicio debe aumentar la frecuencia cardaca y hacerlo transpirar (ejercicio de intensidad moderada).  Hacer ejercicios de fortalecimiento por lo menos dos veces por semana. Agregue esto a su plan de ejercicio de intensidad moderada.  Pasar menos tiempo sentados. Incluso la actividad fsica ligera puede ser beneficiosa. Controle sus niveles de colesterol y lpidos en la sangre Comience a realizarse anlisis de lpidos y colesterol en la sangre a los 20aos y luego reptalos cada 5aos. Hgase controlar los niveles de colesterol con mayor frecuencia si:  Sus niveles de lpidos y colesterol son altos.  Es mayor de 40aos.  Presenta un alto riesgo de padecer enfermedades cardacas. Qu debo saber sobre las pruebas de  deteccin del cncer? Segn su historia clnica y sus antecedentes familiares, es posible que deba realizarse pruebas de deteccin del cncer en diferentes edades. Esto puede incluir pruebas de deteccin de lo siguiente:  Cncer de mama.  Cncer de cuello uterino.  Cncer colorrectal.  Cncer de piel.  Cncer de pulmn. Qu debo saber sobre la enfermedad cardaca, la diabetes y la hipertensin arterial? Presin arterial y enfermedad cardaca  La hipertensin arterial causa enfermedades cardacas y aumenta el riesgo de accidente cerebrovascular. Es ms probable que esto se manifieste en las personas que tienen lecturas de presin arterial alta, tienen ascendencia africana o tienen sobrepeso.  Hgase controlar la presin arterial: ? Cada 3 a 5 aos si tiene entre 18 y 39 aos. ? Todos los aos si es mayor de 40aos. Diabetes Realcese exmenes de deteccin de la diabetes con regularidad. Este anlisis revisa el nivel de azcar en la sangre en ayunas. Hgase las pruebas de deteccin:  Cada tresaos despus de los 40aos de edad si tiene un peso normal y un bajo riesgo de padecer diabetes.  Con ms frecuencia y a partir de una edad inferior si tiene sobrepeso o un alto riesgo de padecer diabetes. Qu debo saber sobre la prevencin de infecciones? Hepatitis B Si tiene un riesgo ms alto de contraer hepatitis B, debe someterse a un examen de deteccin de este virus. Hable con el mdico para averiguar si tiene riesgo de contraer la infeccin por hepatitis B. Hepatitis C Se recomienda el anlisis a:  Todos los que nacieron entre 1945 y 1965.  Todas las personas que tengan un riesgo de haber contrado hepatitis C. Enfermedades de transmisin sexual (ETS)  Hgase las   pruebas de deteccin de ITS, incluidas la gonorrea y la clamidia, si: ? Es sexualmente activa y es menor de 24aos. ? Es mayor de 24aos, y el mdico le informa que corre riesgo de tener este tipo de infecciones. ?  La actividad sexual ha cambiado desde que le hicieron la ltima prueba de deteccin y tiene un riesgo mayor de tener clamidia o gonorrea. Pregntele al mdico si usted tiene riesgo.  Pregntele al mdico si usted tiene un alto riesgo de contraer VIH. El mdico tambin puede recomendarle un medicamento recetado para ayudar a evitar la infeccin por el VIH. Si elige tomar medicamentos para prevenir el VIH, primero debe hacerse los anlisis de deteccin del VIH. Luego debe hacerse anlisis cada 3meses mientras est tomando los medicamentos. Embarazo  Si est por dejar de menstruar (fase premenopusica) y usted puede quedar embarazada, busque asesoramiento antes de quedar embarazada.  Tome de 400 a 800microgramos (mcg) de cido flico todos los das si queda embarazada.  Pida mtodos de control de la natalidad (anticonceptivos) si desea evitar un embarazo no deseado. Osteoporosis y menopausia La osteoporosis es una enfermedad en la que los huesos pierden los minerales y la fuerza por el avance de la edad. El resultado pueden ser fracturas en los huesos. Si tiene 65aos o ms, o si est en riesgo de sufrir osteoporosis y fracturas, pregunte a su mdico si debe:  Hacerse pruebas de deteccin de prdida sea.  Tomar un suplemento de calcio o de vitamina D para reducir el riesgo de fracturas.  Recibir terapia de reemplazo hormonal (TRH) para tratar los sntomas de la menopausia. Siga estas instrucciones en su casa: Estilo de vida  No consuma ningn producto que contenga nicotina o tabaco, como cigarrillos, cigarrillos electrnicos y tabaco de mascar. Si necesita ayuda para dejar de fumar, consulte al mdico.  No consuma drogas.  No comparta agujas.  Solicite ayuda a su mdico si necesita apoyo o informacin para abandonar las drogas. Consumo de alcohol  No beba alcohol si: ? Su mdico le indica no hacerlo. ? Est embarazada, puede estar embarazada o est tratando de quedar embarazada.   Si bebe alcohol: ? Limite la cantidad que consume de 0 a 1 medida por da. ? Limite la ingesta si est amamantando.  Est atento a la cantidad de alcohol que hay en las bebidas que toma. En [place], una medida equivale a una botella de cerveza de 12oz (355ml), un vaso de vino de 5oz (148ml) o un vaso de una bebida alcohlica de alta graduacin de 1oz (44ml). Instrucciones generales  Realcese los estudios de rutina de la salud, dentales y de la vista.  Mantngase al da con las vacunas.  Infrmele a su mdico si: ? Se siente deprimida con frecuencia. ? Alguna vez ha sido vctima de maltrato o no se siente segura en su casa. Resumen  Adoptar un estilo de vida saludable y recibir atencin preventiva son importantes para promover la salud y el bienestar.  Siga las instrucciones del mdico acerca de una dieta saludable, el ejercicio y la realizacin de pruebas o exmenes para detectar enfermedades.  Siga las instrucciones del mdico con respecto al control del colesterol y la presin arterial. Esta informacin no tiene como fin reemplazar el consejo del mdico. Asegrese de hacerle al mdico cualquier pregunta que tenga. Document Released: 07/01/2011 Document Revised: 08/02/2018 Document Reviewed: 08/02/2018 Elsevier Patient Education  2020 [employer]  

## 2019-07-03 NOTE — Progress Notes (Addendum)
Subjective:  Patient ID: Tracy Carson, female    DOB: 02/28/1981  Age: 38 y.o. MRN: [id number]  CC: Gynecologic Exam   HPI Tracy Carson is a 38 year old female with a history of migraines, hypothyroidism who presents today for a gynecological exam. She is concerned her husband has bumps on his penis and would like to know what it is.  I have explained to her that she would need to discuss with her husband about going to see a clinician together as I am only able to provide assessment regarding her care. She has been using condoms during sexual intercourse.  Past Medical History:  Diagnosis Date  . Hypertension   . Thyroid disease    hyper  . Tuberculosis    as a child    Past Surgical History:  Procedure Laterality Date  . CESAREAN SECTION  07/02/2011   Procedure: CESAREAN SECTION;  Surgeon: Roseanna Rainbow, MD;  Location: [facility];  Service: Gynecology;  Laterality: N/A;  . CHOLECYSTECTOMY  2002    Family History  Problem Relation Age of Onset  . Diabetes Mother   . Hypertension Father   . Thyroid disease Paternal Grandmother   . Thyroid disease Sister   . Thyroid disease Brother     No Known Allergies  Outpatient Medications Prior to Visit  Medication Sig Dispense Refill  . methimazole (TAPAZOLE) 10 MG tablet Take 1 tablet (10 mg total) by mouth daily. 30 tablet 2  . metoprolol succinate (TOPROL-XL) 25 MG 24 hr tablet Take 1 tablet (25 mg total) by mouth daily. 30 tablet 0  . naproxen (NAPROSYN) 500 MG tablet Take 1 tablet (500 mg total) by mouth 2 (two) times daily. (Patient not taking: Reported on 07/03/2019) 30 tablet 0  . topiramate (TOPAMAX) 25 MG tablet Take 1 tablet (25 mg total) by mouth 2 (two) times daily. (Patient not taking: Reported on 07/03/2019) 180 tablet 0   No facility-administered medications prior to visit.      ROS Review of Systems  Constitutional: Negative for activity change, appetite change and fatigue.  HENT: Negative for  congestion, sinus pressure and sore throat.   Eyes: Negative for visual disturbance.  Respiratory: Negative for cough, chest tightness, shortness of breath and wheezing.   Cardiovascular: Negative for chest pain and palpitations.  Gastrointestinal: Negative for abdominal distention, abdominal pain and constipation.  Endocrine: Negative for polydipsia.  Genitourinary: Negative for dysuria and frequency.  Musculoskeletal: Negative for arthralgias and back pain.  Skin: Negative for rash.  Neurological: Negative for tremors, light-headedness and numbness.  Hematological: Does not bruise/bleed easily.  Psychiatric/Behavioral: Negative for agitation and behavioral problems.    Objective:  BP 106/72   Pulse 61   Temp 98.1 F (36.7 C) (Oral)   Ht 5\' 2"  (1.575 m)   Wt 169 lb (76.7 kg)   SpO2 98%   BMI 30.91 kg/m   BP/Weight 07/03/2019 06/06/2019 05/28/2019  Systolic BP 106 128 110  Diastolic BP 72 74 78  Wt. (Lbs) 169 167 -  BMI 30.91 30.54 -      Physical Exam Constitutional:      General: She is not in acute distress.    Appearance: She is well-developed. She is not diaphoretic.  HENT:     Head: Normocephalic.     Right Ear: External ear normal.     Left Ear: External ear normal.     Nose: Nose normal.  Eyes:     Conjunctiva/sclera: Conjunctivae normal.  Pupils: Pupils are equal, round, and reactive to light.  Neck:     Musculoskeletal: Normal range of motion.     Vascular: No JVD.  Cardiovascular:     Rate and Rhythm: Normal rate and regular rhythm.     Heart sounds: Normal heart sounds. No murmur. No gallop.   Pulmonary:     Effort: Pulmonary effort is normal. No respiratory distress.     Breath sounds: Normal breath sounds. No wheezing or rales.  Chest:     Chest wall: No tenderness.     Breasts:        Right: No mass or tenderness.        Left: No mass or tenderness.  Abdominal:     General: Bowel sounds are normal. There is no distension.     Palpations:  Abdomen is soft. There is no mass.     Tenderness: There is no abdominal tenderness.  Genitourinary:    Comments: External genitalia, vagina, cervix, adnexa-normal Musculoskeletal: Normal range of motion.        General: No tenderness.  Skin:    General: Skin is warm and dry.  Neurological:     Mental Status: She is alert and oriented to person, place, and time.     Deep Tendon Reflexes: Reflexes are normal and symmetric.     CMP Latest Ref Rng & Units 05/28/2019 05/16/2019 04/12/2019  Glucose 70 - 99 mg/dL 213(Y) 865(H) -  BUN 6 - 20 mg/dL 5(L) 10 -  Creatinine 8.46 - 1.00 mg/dL 9.62 9.52 8.41  Sodium 135 - 145 mmol/L 136 135 -  Potassium 3.5 - 5.1 mmol/L 3.9 3.6 -  Chloride 98 - 111 mmol/L 103 104 -  CO2 22 - 32 mmol/L 20(L) 21(L) -  Calcium 8.9 - 10.3 mg/dL 9.2 9.3 -  Total Protein 6.5 - 8.1 g/dL 7.3 - -  Total Bilirubin 0.3 - 1.2 mg/dL 0.5 - -  Alkaline Phos 38 - 126 U/L 107 - -  AST 15 - 41 U/L 21 - -  ALT 0 - 44 U/L 25 - -    Lipid Panel     Component Value Date/Time   CHOL 112 11/15/2016 1038   TRIG 162 (H) 11/15/2016 1038   HDL 24 (L) 11/15/2016 1038   CHOLHDL 4.7 (H) 11/15/2016 1038   LDLCALC 56 11/15/2016 1038    CBC    Component Value Date/Time   WBC 5.5 05/28/2019 1332   RBC 4.95 05/28/2019 1332   HGB 15.9 (H) 05/28/2019 1332   HGB 13.3 11/15/2016 1038   HCT 43.5 05/28/2019 1332   HCT 38.1 11/15/2016 1038   PLT 241 05/28/2019 1332   PLT 185 11/15/2016 1038   MCV 87.9 05/28/2019 1332   MCV 81 11/15/2016 1038   MCH 32.1 05/28/2019 1332   MCHC 36.6 (H) 05/28/2019 1332   RDW 11.4 (L) 05/28/2019 1332   RDW 13.5 11/15/2016 1038   LYMPHSABS 1.7 05/28/2019 1332   LYMPHSABS 2.7 11/15/2016 1038   MONOABS 0.3 05/28/2019 1332   EOSABS 0.2 05/28/2019 1332   EOSABS 0.2 11/15/2016 1038   BASOSABS 0.0 05/28/2019 1332   BASOSABS 0.0 11/15/2016 1038    Lab Results  Component Value Date   HGBA1C 5.2 11/15/2016    Assessment & Plan:   1. Annual physical  exam Counseled on 150 minutes of exercise per week, healthy eating (including decreased daily intake of saturated fats, cholesterol, added sugars, sodium), STI prevention, routine healthcare maintenance.   2. Screening  for cervical cancer - Cytology - PAP()    Meds ordered this encounter  Medications  . metoprolol succinate (TOPROL-XL) 25 MG 24 hr tablet    Sig: Take 1 tablet (25 mg total) by mouth daily.    Dispense:  30 tablet    Refill:  2    Follow-up: Return in about 6 months (around 01/01/2020) for Medical conditions.       [name], MD, FAAFP. [facility] [place], [place] [phone]   07/03/2019, 11:09 AM

## 2019-07-03 NOTE — Addendum Note (Signed)
Addended by: Charlott Rakes on: 07/03/2019 11:18 AM   Modules accepted: Orders

## 2019-07-04 LAB — CYTOLOGY - PAP
Comment: NEGATIVE
Diagnosis: NEGATIVE
High risk HPV: NEGATIVE

## 2019-07-04 LAB — CERVICOVAGINAL ANCILLARY ONLY
Bacterial Vaginitis (gardnerella): NEGATIVE
Candida Glabrata: NEGATIVE
Candida Vaginitis: NEGATIVE
Chlamydia: NEGATIVE
Comment: NEGATIVE
Comment: NEGATIVE
Comment: NEGATIVE
Comment: NEGATIVE
Comment: NEGATIVE
Comment: NORMAL
Neisseria Gonorrhea: NEGATIVE
Trichomonas: NEGATIVE

## 2019-07-28 ENCOUNTER — Encounter (HOSPITAL_COMMUNITY): Payer: Self-pay | Admitting: Emergency Medicine

## 2019-07-28 ENCOUNTER — Emergency Department (HOSPITAL_COMMUNITY): Payer: Self-pay

## 2019-07-28 ENCOUNTER — Emergency Department (HOSPITAL_COMMUNITY)
Admission: EM | Admit: 2019-07-28 | Discharge: 2019-07-28 | Disposition: A | Discharge status: Home / Self Care | Payer: Self-pay | Attending: Emergency Medicine | Admitting: Emergency Medicine

## 2019-07-28 ENCOUNTER — Other Ambulatory Visit: Payer: Self-pay

## 2019-07-28 DIAGNOSIS — I10 Essential (primary) hypertension: Secondary | ICD-10-CM | POA: Insufficient documentation

## 2019-07-28 DIAGNOSIS — F17210 Nicotine dependence, cigarettes, uncomplicated: Secondary | ICD-10-CM | POA: Insufficient documentation

## 2019-07-28 DIAGNOSIS — Z79899 Other long term (current) drug therapy: Secondary | ICD-10-CM | POA: Insufficient documentation

## 2019-07-28 DIAGNOSIS — R072 Precordial pain: Secondary | ICD-10-CM | POA: Insufficient documentation

## 2019-07-28 LAB — CBC
HCT: 45.2 % (ref 36.0–46.0)
Hemoglobin: 16.4 g/dL — ABNORMAL HIGH (ref 12.0–15.0)
MCH: 32.5 pg (ref 26.0–34.0)
MCHC: 36.3 g/dL — ABNORMAL HIGH (ref 30.0–36.0)
MCV: 89.7 fL (ref 80.0–100.0)
Platelets: 213 10*3/uL (ref 150–400)
RBC: 5.04 MIL/uL (ref 3.87–5.11)
RDW: 12.1 % (ref 11.5–15.5)
WBC: 6.9 10*3/uL (ref 4.0–10.5)
nRBC: 0 % (ref 0.0–0.2)

## 2019-07-28 LAB — D-DIMER, QUANTITATIVE: D-Dimer, Quant: <0.27 ug/mL-FEU (ref 0.00–0.50)

## 2019-07-28 LAB — BASIC METABOLIC PANEL
Anion gap: 8 (ref 5–15)
BUN: 6 mg/dL (ref 6–20)
CO2: 23 mmol/L (ref 22–32)
Calcium: 9.2 mg/dL (ref 8.9–10.3)
Chloride: 106 mmol/L (ref 98–111)
Creatinine, Ser: 0.63 mg/dL (ref 0.44–1.00)
GFR calc Af Amer: >60 mL/min (ref >60)
GFR calc non Af Amer: >60 mL/min (ref >60)
Glucose, Bld: 121 mg/dL — ABNORMAL HIGH (ref 70–99)
Potassium: 3.8 mmol/L (ref 3.5–5.1)
Sodium: 137 mmol/L (ref 135–145)

## 2019-07-28 LAB — TROPONIN I (HIGH SENSITIVITY)
Troponin I (High Sensitivity): <2 ng/L (ref <18)
Troponin I (High Sensitivity): <2 ng/L (ref <18)

## 2019-07-28 LAB — I-STAT BETA HCG BLOOD, ED (MC, WL, AP ONLY): I-stat hCG, quantitative: <5 m[IU]/mL (ref <5)

## 2019-07-28 MED ORDER — HYDROCODONE-ACETAMINOPHEN 5-325 MG PO TABS
1.0000 | ORAL_TABLET | Freq: Once | ORAL | Status: AC
  Administered 2019-07-28: 20:00:00 1 via ORAL
  Filled 2019-07-28: qty 1

## 2019-07-28 MED ORDER — HYDROCODONE-ACETAMINOPHEN 5-325 MG PO TABS: 1.0000 | ORAL_TABLET | Freq: Four times a day (QID) | ORAL | 0 refills | Status: DC | PRN

## 2019-07-28 MED ORDER — SODIUM CHLORIDE 0.9% FLUSH
3.0000 mL | Freq: Once | INTRAVENOUS | Status: AC
  Administered 2019-07-28: 3 mL via INTRAVENOUS

## 2019-07-28 NOTE — ED Triage Notes (Signed)
Pt reports L sided chest pain that radiates down her L arm since yesterday. Denies sob, cough, fevers or recent sick contacts.

## 2019-07-28 NOTE — ED Provider Notes (Signed)
[facility] EMERGENCY DEPARTMENT Provider Note   CSN: [id number] Arrival date & time: 07/28/19  1444     History Chief Complaint  Patient presents with  . Chest Pain    Tracy Carson is a 39 y.o. female.  Patient with left-sided chest pain that radiates down her left arm since yesterday.  Prior to that she had some tingling in those areas.  For about a week.  No shortness of breath no cough no fevers no nausea or vomiting.  Past medical history is significant for thyroid disease and hypertension.  The pain is been constant.  Not made worse or better by anything.        Past Medical History:  Diagnosis Date  . Hypertension   . Thyroid disease    hyper  . Tuberculosis    as a child    Patient Active Problem List   Diagnosis Date Noted  . 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  . History of blurry vision 05/20/2014  . History of itching of eye 05/20/2014  . Other fatigue 05/20/2014  . Tobacco dependence 05/20/2014  . Palpitations 05/20/2014  . Abdominal pain 11/03/2012  . 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 Tachycardia 11/03/2012  . Hematochezia 11/03/2012  . Graves disease 11/03/2012  . Anemia 11/03/2012  . Breech presentation 07/02/2011  . Cesarean delivery, without mention of indication, delivered, with or without mention of antepartum condition 07/02/2011    Past Surgical History:  Procedure Laterality Date  . CESAREAN SECTION  07/02/2011   Procedure: CESAREAN SECTION;  Surgeon: Roseanna Rainbow, MD;  Location: [facility];  Service: Gynecology;  Laterality: N/A;  . CHOLECYSTECTOMY  2002     OB History    Gravida  4   Para  4   Term  4   Preterm      AB      Living  4     SAB      TAB      Ectopic      Multiple      Live Births  3           Family History  Problem Relation Age of Onset  . Diabetes Mother   . Hypertension Father   . Thyroid disease Paternal Grandmother   . Thyroid disease Sister   . Thyroid disease Brother      Social History   Tobacco Use  . Smoking status: Current Some Day Smoker    Packs/day: 5.00    Years: 1.00    Pack years: 5.00    Types: Cigars  . Smokeless tobacco: Former Neurosurgeon  . Tobacco comment: 03/28/19 7-8/day  Substance Use Topics  . Alcohol use: No    Comment: quit 10/2018  . Drug use: No    Home Medications Prior to Admission medications   Medication Sig Start Date End Date Taking? Authorizing Provider  HYDROcodone-acetaminophen (NORCO/VICODIN) 5-325 MG tablet Take 1 tablet by mouth every 6 (six) hours as needed for moderate pain. 07/28/19   Vanetta Mulders, MD  methimazole (TAPAZOLE) 10 MG tablet Take 1 tablet (10 mg total) by mouth daily. 05/30/19   Reather Littler, MD  metoprolol succinate (TOPROL-XL) 25 MG 24 hr tablet Take 1 tablet (25 mg total) by mouth daily. 07/03/19   [name], MD  naproxen (NAPROSYN) 500 MG tablet Take 1 tablet (500 mg total) by mouth 2 (two) times daily. Patient not taking: Reported on 07/03/2019 03/10/19   Wallis Bamberg, PA-C  topiramate (TOPAMAX) 25 MG tablet Take 1  tablet (25 mg total) by mouth 2 (two) times daily. Patient not taking: Reported on 07/03/2019 05/24/19   Shawnie Dapper, NP    Allergies    Patient has no known allergies.  Review of Systems   Review of Systems  Constitutional: Negative for chills and fever.  HENT: Negative for congestion, rhinorrhea and sore throat.   Eyes: Negative for visual disturbance.  Respiratory: Negative for cough and shortness of breath.   Cardiovascular: Positive for chest pain. Negative for leg swelling.  Gastrointestinal: Negative for abdominal pain, diarrhea, nausea and vomiting.  Genitourinary: Negative for dysuria.  Musculoskeletal: Negative for back pain and neck pain.  Skin: Negative for rash.  Neurological: Negative for dizziness, light-headedness and headaches.  Hematological: Does not bruise/bleed easily.  Psychiatric/Behavioral: Negative for confusion.    Physical Exam Updated Vital  Signs BP 103/78   Pulse 60   Temp 98.5 F (36.9 C) (Oral)   Resp 15   SpO2 97%   Physical Exam Vitals and nursing note reviewed.  Constitutional:      General: She is not in acute distress.    Appearance: Normal appearance. She is well-developed.  HENT:     Head: Normocephalic and atraumatic.  Eyes:     Extraocular Movements: Extraocular movements intact.     Conjunctiva/sclera: Conjunctivae normal.     Pupils: Pupils are equal, round, and reactive to light.  Cardiovascular:     Rate and Rhythm: Normal rate and regular rhythm.     Heart sounds: No murmur.  Pulmonary:     Effort: Pulmonary effort is normal. No respiratory distress.     Breath sounds: Normal breath sounds.  Chest:     Chest wall: No tenderness.  Abdominal:     Palpations: Abdomen is soft.     Tenderness: There is no abdominal tenderness.  Musculoskeletal:        General: No swelling. Normal range of motion.     Cervical back: Normal range of motion and neck supple.  Skin:    General: Skin is warm and dry.     Capillary Refill: Capillary refill takes less than 2 seconds.  Neurological:     General: No focal deficit present.     Mental Status: She is alert and oriented to person, place, and time.     Cranial Nerves: No cranial nerve deficit.     Sensory: No sensory deficit.     ED Results / Procedures / Treatments   Labs (all labs ordered are listed, but only abnormal results are displayed) Labs Reviewed  BASIC METABOLIC PANEL - Abnormal; Notable for the following components:      Result Value   Glucose, Bld 121 (*)    All other components within normal limits  CBC - Abnormal; Notable for the following components:   Hemoglobin 16.4 (*)    MCHC 36.3 (*)    All other components within normal limits  D-DIMER, QUANTITATIVE (NOT AT [facility])  I-STAT BETA HCG BLOOD, ED ([facility], [facility], [facility] ONLY)  TROPONIN I (HIGH SENSITIVITY)  TROPONIN I (HIGH SENSITIVITY)  TROPONIN I (HIGH SENSITIVITY)  TROPONIN I (HIGH  SENSITIVITY)    EKG None  Radiology DG Chest 2 View  Result Date: 07/28/2019 CLINICAL DATA:  Left-sided chest pain EXAM: CHEST - 2 VIEW COMPARISON:  11/09/2016 FINDINGS: The heart size and mediastinal contours are within normal limits. Both lungs are clear. The visualized skeletal structures are unremarkable. IMPRESSION: No acute abnormality of the lungs. Electronically Signed   By: Trinna Post  Laqueta Carina M.D.   On: 07/28/2019 15:34    Procedures Procedures (including critical care time)  Medications Ordered in ED Medications  HYDROcodone-acetaminophen (NORCO/VICODIN) 5-325 MG per tablet 1 tablet (has no administration in time range)  sodium chloride flush (NS) 0.9 % injection 3 mL (3 mLs Intravenous Given 07/28/19 1856)    ED Course  I have reviewed the triage vital signs and the nursing notes.  Pertinent labs & imaging results that were available during my care of the patient were reviewed by me and considered in my medical decision making (see chart for details).    MDM Rules/Calculators/A&P                       Work-up for the chest pain without any acute findings.  Troponins x2 were normal.  D-dimer not elevated.  Chest x-ray without any significant abnormalities.  Patient certainly has left-sided chest pain that radiates to the arm but does not appear to be from anything dangerous requiring admission at this time.  We will have her follow-up with her primary care doctor and treat her with pain medication.     Final Clinical Impression(s) / ED Diagnoses Final diagnoses:  Precordial pain    Rx / DC Orders ED Discharge Orders         Ordered    HYDROcodone-acetaminophen (NORCO/VICODIN) 5-325 MG tablet  Every 6 hours PRN     07/28/19 2023           Fredia Sorrow, MD 07/28/19 2023

## 2019-07-28 NOTE — Discharge Instructions (Addendum)
Make an appointment to follow-up with your regular doctor.  Take the pain medication as needed.  Today's work-up without any acute findings.

## 2019-07-30 ENCOUNTER — Telehealth: Payer: Self-pay | Admitting: *Deleted

## 2019-07-30 NOTE — Telephone Encounter (Signed)
Pharmacy called regarding not being able to fill narcotic Rx.

## 2019-08-01 ENCOUNTER — Encounter: Payer: Self-pay | Admitting: Family Medicine

## 2019-08-01 ENCOUNTER — Other Ambulatory Visit: Payer: Self-pay

## 2019-08-01 ENCOUNTER — Ambulatory Visit: Payer: Self-pay | Attending: Family Medicine | Admitting: Family Medicine

## 2019-08-01 DIAGNOSIS — F419 Anxiety disorder, unspecified: Secondary | ICD-10-CM

## 2019-08-01 DIAGNOSIS — R072 Precordial pain: Secondary | ICD-10-CM

## 2019-08-01 MED ORDER — HYDROXYZINE HCL 25 MG PO TABS: 25.0000 mg | ORAL_TABLET | Freq: Three times a day (TID) | ORAL | 1 refills | Status: DC | PRN

## 2019-08-01 NOTE — Progress Notes (Signed)
Virtual Visit via Telephone Note  I connected with Tracy Carson, on 08/01/2019 at 4:00 PM by telephone due to the COVID-19 pandemic and verified that I am speaking with the correct person using two identifiers.   Consent: I discussed the limitations, risks, security and privacy concerns of performing an evaluation and management service by telephone and the availability of in person appointments. I also discussed with the patient that there may be a patient responsible charge related to this service. The patient expressed understanding and agreed to proceed.   Location of Patient: Home  Location of Provider: Clinic   Persons participating in Telemedicine visit: Kimber Fritts ID [id number] - Interpreter Dr. Alvis Lemmings     History of Present Illness: Tracy Carson is a 39 year old female with a history of migraines, hypothyroidism who presents today for a follow up.  Seen at the ED for precordial chest pain 4 days ago. Troponins were negative and EKG was unremarkable. She was prescribed Vicodin and then discharged but she is yet to obtain the Vicodin as this is not carried at the [facility] pharmacy but she is currently using Ibuprofen 800mg . She denies having dyspnea but does endorse anxiety and "fear" States she is feeling a bit better but she sometimes has chest pain.  Past Medical History:  Diagnosis Date  . Hypertension   . Thyroid disease    hyper  . Tuberculosis    as a child   No Known Allergies  Current Outpatient Medications on File Prior to Visit  Medication Sig Dispense Refill  . methimazole (TAPAZOLE) 10 MG tablet Take 1 tablet (10 mg total) by mouth daily. 30 tablet 2  . metoprolol succinate (TOPROL-XL) 25 MG 24 hr tablet Take 1 tablet (25 mg total) by mouth daily. 30 tablet 2  . HYDROcodone-acetaminophen (NORCO/VICODIN) 5-325 MG tablet Take 1 tablet by mouth every 6 (six) hours as needed for moderate pain. (Patient not taking:  Reported on 08/01/2019) 14 tablet 0  . naproxen (NAPROSYN) 500 MG tablet Take 1 tablet (500 mg total) by mouth 2 (two) times daily. (Patient not taking: Reported on 07/03/2019) 30 tablet 0  . topiramate (TOPAMAX) 25 MG tablet Take 1 tablet (25 mg total) by mouth 2 (two) times daily. (Patient not taking: Reported on 07/03/2019) 180 tablet 0   No current facility-administered medications on file prior to visit.    Observations/Objective: Alert, awake, oriented x3 Not in acute distress  Assessment and Plan: 1. Anxiety Anxiety likely playing a role in chest pain Will commence Hydroxyzine - hydrOXYzine (ATARAX/VISTARIL) 25 MG tablet; Take 1 tablet (25 mg total) by mouth 3 (three) times daily as needed.  Dispense: 60 tablet; Refill: 1  2. Precordial chest pain Atypical chest pain She does have some form of underlying anxiety   Follow Up Instructions: Keep previously schedule appointment   I discussed the assessment and treatment plan with the patient. The patient was provided an opportunity to ask questions and all were answered. The patient agreed with the plan and demonstrated an understanding of the instructions.   The patient was advised to call back or seek an in-person evaluation if the symptoms worsen or if the condition fails to improve as anticipated.     I provided 15 minutes total of non-face-to-face time during this encounter including median intraservice time, reviewing previous notes, labs, imaging, medications, management and patient verbalized understanding.     14/02/2019, MD, FAAFP. [facility] [place], [place]  [phone]   08/01/2019, 4:00 PM

## 2019-08-01 NOTE — Progress Notes (Signed)
Patient has been called and DOB has been verified. Patient has been screened and transferred to PCP to start phone visit.     

## 2019-08-06 ENCOUNTER — Telehealth: Payer: Self-pay

## 2019-08-06 ENCOUNTER — Ambulatory Visit: Payer: Self-pay | Admitting: Family Medicine

## 2019-08-06 ENCOUNTER — Encounter: Payer: Self-pay | Admitting: Family Medicine

## 2019-08-06 NOTE — Telephone Encounter (Signed)
Patient was a no call/no show for their appointment today.   

## 2019-08-06 NOTE — Progress Notes (Deleted)
PATIENT: Tracy Carson DOB: 1980/12/27  REASON FOR VISIT: follow up HISTORY FROM: patient  No chief complaint on file.    HISTORY OF PRESENT ILLNESS: Today 08/06/19 Tracy Carson is a 39 y.o. female here today for follow up. We started topiramate 25mg  twice daily in 04/2019.   HISTORY: (copied from my note on 05/24/2019)  Tracy Carson is a 39 y.o. female here today for follow up for chronic headaches. She reports that overall headaches have improved.  She does continue to have regular headaches but does not feel they are as intense.  She had dental work performed recently that has helped.  She tried amitriptyline but was really sleepy the next day.  She reports taking either ibuprofen, Tylenol or Aleve on a regular basis.  Sometimes she will take all 3 on the same day.  She had Nexplanon removed on 10/9.  MRI was unremarkable with the exception of maxillary sinus thickening.  She does have chronic allergy symptoms.  She is not currently treated with antihistamines.  She reports seeing an ENT provider recently.  No recommendations were made.  She has never had an eye exam.  HISTORY: (copied from Dr Richrd Humbles note on 03/28/2019)  39 year old female here for evaluation of headaches.   June 2020 patient had onset of right-sided face, neck pain and swelling. 1 week prior to onset of symptoms patient had an accident where a piece of sheet rock fell on the top of her head, causing mild pain but no significant problems. She also had been playing with her children around that time and doing headstands which caused some pressure in the top of her head. Then she started to have pain and headache in the right side of her head radiating to the back of the head and neck. Sometimes associated with blurred vision and nausea, conjunctival injection and tearing. Sometimes associated with right-sided facial sweating. Headaches can last 15 to 20 minutes at a time. Headaches occur on a  daily basis. Now headaches occurring twice per day. Patient also had significant anxiety and stress related to these headaches. She had multiple emergency room evaluations. No prior similar problems or headaches when she was younger.  Patient is followed up with ENT and dentist. No specific ENT problems have been found. She has been recommended to have multiple dental extractions due to periodontal disease bilaterally.  CT soft tissue of the neck showed nonspecific swelling of right platysma. CTA of the head and neck was normal.  Patient is tried indomethacin without relief. Also tried Tylenol, ibuprofen, naproxen, prednisone, antibiotics without relief.   REVIEW OF SYSTEMS: Out of a complete 14 system review of symptoms, the patient complains only of the following symptoms, and all other reviewed systems are negative.  ALLERGIES: No Known Allergies  HOME MEDICATIONS: Outpatient Medications Prior to Visit  Medication Sig Dispense Refill  . HYDROcodone-acetaminophen (NORCO/VICODIN) 5-325 MG tablet Take 1 tablet by mouth every 6 (six) hours as needed for moderate pain. (Patient not taking: Reported on 08/01/2019) 14 tablet 0  . hydrOXYzine (ATARAX/VISTARIL) 25 MG tablet Take 1 tablet (25 mg total) by mouth 3 (three) times daily as needed. 60 tablet 1  . methimazole (TAPAZOLE) 10 MG tablet Take 1 tablet (10 mg total) by mouth daily. 30 tablet 2  . metoprolol succinate (TOPROL-XL) 25 MG 24 hr tablet Take 1 tablet (25 mg total) by mouth daily. 30 tablet 2  . naproxen (NAPROSYN) 500 MG tablet Take 1 tablet (500 mg total)  by mouth 2 (two) times daily. (Patient not taking: Reported on 07/03/2019) 30 tablet 0  . topiramate (TOPAMAX) 25 MG tablet Take 1 tablet (25 mg total) by mouth 2 (two) times daily. (Patient not taking: Reported on 07/03/2019) 180 tablet 0   No facility-administered medications prior to visit.    PAST MEDICAL HISTORY: Past Medical History:  Diagnosis Date  .  Hypertension   . Thyroid disease    hyper  . Tuberculosis    as a child    PAST SURGICAL HISTORY: Past Surgical History:  Procedure Laterality Date  . CESAREAN SECTION  07/02/2011   Procedure: CESAREAN SECTION;  Surgeon: Roseanna Rainbow, MD;  Location: [facility];  Service: Gynecology;  Laterality: N/A;  . CHOLECYSTECTOMY  2002    FAMILY HISTORY: Family History  Problem Relation Age of Onset  . Diabetes Mother   . Hypertension Father   . Thyroid disease Paternal Grandmother   . Thyroid disease Sister   . Thyroid disease Brother     SOCIAL HISTORY: Social History   Socioeconomic History  . Marital status: Married    Spouse name: Blossom Hoops  . Number of children: 4  . Years of education: 7  . Highest education level: Not on file  Occupational History    Comment: na  Tobacco Use  . Smoking status: Current Some Day Smoker    Packs/day: 5.00    Years: 1.00    Pack years: 5.00    Types: Cigars  . Smokeless tobacco: Former Neurosurgeon  . Tobacco comment: 03/28/19 7-8/day  Substance and Sexual Activity  . Alcohol use: No    Comment: quit 10/2018  . Drug use: No  . Sexual activity: Yes    Birth control/protection: Implant  Other Topics Concern  . Not on file  Social History Narrative   Lives with spouse, 4 daughters   Some caffeine   Social Determinants of Health   Financial Resource Strain:   . Difficulty of Paying Living Expenses: Not on file  Food Insecurity:   . Worried About Programme researcher, broadcasting/film/video in the Last Year: Not on file  . Ran Out of Food in the Last Year: Not on file  Transportation Needs:   . Lack of Transportation (Medical): Not on file  . Lack of Transportation (Non-Medical): Not on file  Physical Activity:   . Days of Exercise per Week: Not on file  . Minutes of Exercise per Session: Not on file  Stress:   . Feeling of Stress : Not on file  Social Connections:   . Frequency of Communication with Friends and Family: Not on file  . Frequency of Social  Gatherings with Friends and Family: Not on file  . Attends Religious Services: Not on file  . Active Member of Clubs or Organizations: Not on file  . Attends Banker Meetings: Not on file  . Marital Status: Not on file  Intimate Partner Violence:   . Fear of Current or Ex-Partner: Not on file  . Emotionally Abused: Not on file  . Physically Abused: Not on file  . Sexually Abused: Not on file      PHYSICAL EXAM  There were no vitals filed for this visit. There is no height or weight on file to calculate BMI.  Generalized: Well developed, in no acute distress  Cardiology: normal rate and rhythm, no murmur noted Neurological examination  Mentation: Alert oriented to time, place, history taking. Follows all commands speech and language fluent Cranial nerve  II-XII: Pupils were equal round reactive to light. Extraocular movements were full, visual field were full on confrontational test. Facial sensation and strength were normal. Uvula tongue midline. Head turning and shoulder shrug  were normal and symmetric. Motor: The motor testing reveals 5 over 5 strength of all 4 extremities. Good symmetric motor tone is noted throughout.  Sensory: Sensory testing is intact to soft touch on all 4 extremities. No evidence of extinction is noted.  Coordination: Cerebellar testing reveals good finger-nose-finger and heel-to-shin bilaterally.  Gait and station: Gait is normal. Tandem gait is normal. Romberg is negative. No drift is seen.  Reflexes: Deep tendon reflexes are symmetric and normal bilaterally.   DIAGNOSTIC DATA (LABS, IMAGING, TESTING) - I reviewed patient records, labs, notes, testing and imaging myself where available.  No flowsheet data found.   Lab Results  Component Value Date   WBC 6.9 07/28/2019   HGB 16.4 (H) 07/28/2019   HCT 45.2 07/28/2019   MCV 89.7 07/28/2019   PLT 213 07/28/2019      Component Value Date/Time   NA 137 07/28/2019 1509   K 3.8 07/28/2019  1509   CL 106 07/28/2019 1509   CO2 23 07/28/2019 1509   GLUCOSE 121 (H) 07/28/2019 1509   BUN 6 07/28/2019 1509   CREATININE 0.63 07/28/2019 1509   CREATININE 0.41 (L) 06/27/2014 1150   CALCIUM 9.2 07/28/2019 1509   PROT 7.3 05/28/2019 1332   ALBUMIN 4.3 05/28/2019 1332   AST 21 05/28/2019 1332   ALT 25 05/28/2019 1332   ALKPHOS 107 05/28/2019 1332   BILITOT 0.5 05/28/2019 1332   GFRNONAA >60 07/28/2019 1509   GFRNONAA >89 05/16/2014 1204   GFRAA >60 07/28/2019 1509   GFRAA >89 05/16/2014 1204   Lab Results  Component Value Date   CHOL 112 11/15/2016   HDL 24 (L) 11/15/2016   LDLCALC 56 11/15/2016   TRIG 162 (H) 11/15/2016   CHOLHDL 4.7 (H) 11/15/2016   Lab Results  Component Value Date   HGBA1C 5.2 11/15/2016   Lab Results  Component Value Date   VITAMINB12 1,137 (H) 11/03/2012   Lab Results  Component Value Date   TSH <0.01 (L) 04/10/2019       ASSESSMENT AND PLAN 39 y.o. year old female  has a past medical history of Hypertension, Thyroid disease, and Tuberculosis. here with ***  No diagnosis found.     No orders of the defined types were placed in this encounter.    No orders of the defined types were placed in this encounter.     I spent 15 minutes with the patient. 50% of this time was spent counseling and educating patient on plan of care and medications.    Shawnie Dapper, FNP-C 08/06/2019, 7:49 AM [facility] [address] [place], [place] [address] [phone]

## 2019-08-13 ENCOUNTER — Other Ambulatory Visit (INDEPENDENT_AMBULATORY_CARE_PROVIDER_SITE_OTHER): Payer: Self-pay

## 2019-08-13 ENCOUNTER — Other Ambulatory Visit: Payer: Self-pay

## 2019-08-13 DIAGNOSIS — E0590 Thyrotoxicosis, unspecified without thyrotoxic crisis or storm: Secondary | ICD-10-CM

## 2019-08-13 LAB — T4, FREE: Free T4: 0.88 ng/dL (ref 0.60–1.60)

## 2019-08-13 LAB — TSH: TSH: <0.01 u[IU]/mL — ABNORMAL LOW (ref 0.35–4.50)

## 2019-08-16 ENCOUNTER — Ambulatory Visit (INDEPENDENT_AMBULATORY_CARE_PROVIDER_SITE_OTHER): Payer: Self-pay | Admitting: Endocrinology

## 2019-08-16 ENCOUNTER — Other Ambulatory Visit: Payer: Self-pay

## 2019-08-16 ENCOUNTER — Encounter: Payer: Self-pay | Admitting: Endocrinology

## 2019-08-16 VITALS — BP 110/76 | HR 71 | Wt 174.2 lb

## 2019-08-16 DIAGNOSIS — E0590 Thyrotoxicosis, unspecified without thyrotoxic crisis or storm: Secondary | ICD-10-CM

## 2019-08-16 NOTE — Progress Notes (Signed)
Patient ID: Tracy Carson, female   DOB: 1980/11/30, 39 y.o.   MRN: [id number]                                                                                                               Reason for Appointment:  Hyperthyroidism, follow-up visit    Chief complaint: Follow-up of thyroid   History of Present Illness:   Prior history: Her hyperthyroidism was diagnosed in 2012 before a pregnancy At that time she had symptoms of palpitations, shakiness, feeling excessively warm, difficulty sleeping, increased appetite and hair loss She was treated with methimazole She was recommended I-131 treatment in 2014 but even though she had an uptake test done she did not go for the treatment Subsequently has been very irregular with her follow-up program and medications She says she was taking her methimazole 2 tablets twice a day every third day or so because of cost Because of worsening symptoms of palpitations, feeling excessively hot and losing weight she presented to the emergency room on 11/08/16 and was admitted for 2 days  On her initial consultation in her free T4 was still very high at 3.04 and she was having problems with fatigue, weakness and heat intolerance along with tachycardia. Her methimazole was increased to 3 tablets twice a day initially   She finally agreed to do that I-131 treatment when she was seen in June and this was done with 19.5 mCi on 02/16/17  RECENT history  In October 2018 she had normal thyroid functions and had no complaints but did not follow-up as directed subsequently  In June 2020 at the ER she thought she had swollen glands in her neck along with anxiety and palpitations At that time her thyroid levels showed hyperthyroidism and free T4 was significantly high at 2.3 She was started on methimazole 10 mg twice daily by her PCP on 01/11/2019  On her initial consultation back in August 2020 with her methimazole was further adjusted In September 2020 she was  told to increase methimazole further to 10 mg This was not changed on her last visit in 11/20  She says she feels tired and sleepy at times Has had a little weight gain Also has some hair loss Recently seen by PCP for symptoms of anxiety but also admits to some depressed mood also  She is taking her methimazole 10 mg regularly She has been reluctant to redo her I-131 treatment  Her free T4 levels are normal, TSH still suppressed    Wt Readings from Last 3 Encounters:  08/16/19 174 lb 3.2 oz (79 kg)  07/03/19 169 lb (76.7 kg)  06/06/19 167 lb (75.8 kg)     Thyroid function tests as follows:     Lab Results  Component Value Date   FREET4 0.88 08/13/2019   FREET4 0.82 05/24/2019   FREET4 1.61 (H) 04/10/2019   T3FREE 3.2 05/24/2019   T3FREE 4.3 (H) 04/10/2019   T3FREE 4.2 02/26/2019   TSH <0.01 (L) 08/13/2019   TSH <0.01 (L) 04/10/2019  TSH <0.010 (L) 01/06/2019    No results found for: THYROTRECAB   Allergies as of 08/16/2019   No Known Allergies     Medication List       Accurate as of August 16, 2019  8:48 AM. If you have any questions, ask your nurse or doctor.        HYDROcodone-acetaminophen 5-325 MG tablet Commonly known as: NORCO/VICODIN Take 1 tablet by mouth every 6 (six) hours as needed for moderate pain.   hydrOXYzine 25 MG tablet Commonly known as: ATARAX/VISTARIL Take 1 tablet (25 mg total) by mouth 3 (three) times daily as needed.   methimazole 10 MG tablet Commonly known as: TAPAZOLE Take 1 tablet (10 mg total) by mouth daily.   metoprolol succinate 25 MG 24 hr tablet Commonly known as: TOPROL-XL Take 1 tablet (25 mg total) by mouth daily.   naproxen 500 MG tablet Commonly known as: NAPROSYN Take 1 tablet (500 mg total) by mouth 2 (two) times daily.   topiramate 25 MG tablet Commonly known as: TOPAMAX Take 1 tablet (25 mg total) by mouth 2 (two) times daily.           Past Medical History:  Diagnosis Date  . Hypertension     . Thyroid disease    hyper  . Tuberculosis    as a child    Past Surgical History:  Procedure Laterality Date  . CESAREAN SECTION  07/02/2011   Procedure: CESAREAN SECTION;  Surgeon: Roseanna Rainbow, MD;  Location: [facility];  Service: Gynecology;  Laterality: N/A;  . CHOLECYSTECTOMY  2002    Family History  Problem Relation Age of Onset  . Diabetes Mother   . Hypertension Father   . Thyroid disease Paternal Grandmother   . Thyroid disease Sister   . Thyroid disease Brother     Social History:  reports that she has been smoking cigars. She has a 5.00 pack-year smoking history. She has quit using smokeless tobacco. She reports that she does not drink alcohol or use drugs.  Allergies: No Known Allergies   Review of Systems    She recently has been treated with hydroxyzine for anxiety with only some relief She also has had some depressed mood and lack of motivation as discussed above, has not discussed with PCP  Having regular menstrual cycles   Examination:   BP 110/76   Pulse 71   Wt 174 lb 3.2 oz (79 kg)   SpO2 99%   BMI 31.86 kg/m   Eyes are mildly prominent with slight swelling Thyroid not palpable No tremor Biceps reflexes appear normal    Assessment/Plan:   Graves' disease, treated with radioactive iodine in July 2018  She has had a recurrence of her hyperthyroidism With 10 mg of methimazole her thyroid levels are consistently controlled  Her symptoms of fatigue and drowsiness as well as anxiety and depression are likely unrelated Not clear why she is having some hair loss  Since her free T4 is still quite normal she can continue 10 mg methimazole Follow-up in 2 months At that time she will again be asked to consider I-131 treatment and she appears to be open to this option compared to long-term methimazole treatment with frequent follow-up  She will need to discuss her anxiety and depression with PCP as she likely needs more specific therapy     There are no Patient Instructions on file for this visit.   Reather Littler 08/16/2019, 8:48 AM    Note: This  office note was prepared with Insurance underwriter. Any transcriptional errors that result from this process are unintentional.

## 2019-08-31 ENCOUNTER — Ambulatory Visit: Payer: Self-pay | Attending: Family Medicine

## 2019-08-31 ENCOUNTER — Other Ambulatory Visit: Payer: Self-pay

## 2019-09-19 ENCOUNTER — Other Ambulatory Visit: Payer: Self-pay

## 2019-09-19 ENCOUNTER — Encounter: Payer: Self-pay | Admitting: Family Medicine

## 2019-09-19 ENCOUNTER — Ambulatory Visit: Payer: Self-pay | Attending: Family Medicine | Admitting: Family Medicine

## 2019-09-19 VITALS — BP 110/71 | HR 67 | Ht 62.0 in | Wt 170.0 lb

## 2019-09-19 DIAGNOSIS — E881 Lipodystrophy, not elsewhere classified: Secondary | ICD-10-CM

## 2019-09-19 DIAGNOSIS — L659 Nonscarring hair loss, unspecified: Secondary | ICD-10-CM

## 2019-09-19 NOTE — Progress Notes (Signed)
Patient states that she is loosing hair. States it started 1 month ago.

## 2019-09-19 NOTE — Progress Notes (Signed)
Established Patient Office Visit  Subjective:  Patient ID: Tracy Carson, female    DOB: February 23, 1981  Age: 39 y.o. MRN: [id number]  CC:  Chief Complaint  Patient presents with  . Alopecia    HPI Tracy Carson  Is a 39 year old female with a history of hyperthyroidism (radioactive iodine treatment 01/2017), migraines, anxiety, and depression who presents today for a follow up visit.  She is followed by endocrinology for treatment of her hyperthyroidism and her most recent appointment was last month.  Stable on methimazole.  Denies heat/cold intolerance, palpitations, chest pain.  Her migraines are stable on current regimen.  She states that she does still experience anxiety and "fear" but she has improvement with hydroxyzine.   Patient states that 5-6 months ago she self administered a "vitamin injection" that she received from [place] to treat back pain, and now has a "boil" on her left buttock.  She wonders if this injection could have contributed to her anxiety.  Today she has an acute complaint of hair loss that began one month ago.  She has noticed the hair "falls out itself" and she has started wearing it up to stop losing hair.  Denies pruritus, scalp changes.  Denies unusual stress level.  Denies recent use of chemicals on her hair/scalp.  She had similar symptoms two years ago prior to her radioactive iodine treatment that resolved after treatment.  Past Medical History:  Diagnosis Date  . Hypertension   . Thyroid disease    hyper  . Tuberculosis    as a child    Past Surgical History:  Procedure Laterality Date  . CESAREAN SECTION  07/02/2011   Procedure: CESAREAN SECTION;  Surgeon: Roseanna Rainbow, MD;  Location: [facility];  Service: Gynecology;  Laterality: N/A;  . CHOLECYSTECTOMY  2002    Family History  Problem Relation Age of Onset  . Diabetes Mother   . Hypertension Father   . Thyroid disease Paternal Grandmother   . Thyroid disease Sister   . Thyroid  disease Brother     Social History   Socioeconomic History  . Marital status: Married    Spouse name: Blossom Hoops  . Number of children: 4  . Years of education: 7  . Highest education level: Not on file  Occupational History    Comment: na  Tobacco Use  . Smoking status: Current Some Day Smoker    Packs/day: 5.00    Years: 1.00    Pack years: 5.00    Types: Cigars  . Smokeless tobacco: Former Neurosurgeon  . Tobacco comment: 03/28/19 7-8/day  Substance and Sexual Activity  . Alcohol use: No    Comment: quit 10/2018  . Drug use: No  . Sexual activity: Yes    Birth control/protection: Implant  Other Topics Concern  . Not on file  Social History Narrative   Lives with spouse, 4 daughters   Some caffeine   Social Determinants of Health   Financial Resource Strain:   . Difficulty of Paying Living Expenses: Not on file  Food Insecurity:   . Worried About Programme researcher, broadcasting/film/video in the Last Year: Not on file  . Ran Out of Food in the Last Year: Not on file  Transportation Needs:   . Lack of Transportation (Medical): Not on file  . Lack of Transportation (Non-Medical): Not on file  Physical Activity:   . Days of Exercise per Week: Not on file  . Minutes of Exercise per Session: Not on file  Stress:   . Feeling of Stress : Not on file  Social Connections:   . Frequency of Communication with Friends and Family: Not on file  . Frequency of Social Gatherings with Friends and Family: Not on file  . Attends Religious Services: Not on file  . Active Member of Clubs or Organizations: Not on file  . Attends Banker Meetings: Not on file  . Marital Status: Not on file  Intimate Partner Violence:   . Fear of Current or Ex-Partner: Not on file  . Emotionally Abused: Not on file  . Physically Abused: Not on file  . Sexually Abused: Not on file    Outpatient Medications Prior to Visit  Medication Sig Dispense Refill  . hydrOXYzine (ATARAX/VISTARIL) 25 MG tablet Take 1 tablet  (25 mg total) by mouth 3 (three) times daily as needed. 60 tablet 1  . methimazole (TAPAZOLE) 10 MG tablet Take 1 tablet (10 mg total) by mouth daily. 30 tablet 2  . metoprolol succinate (TOPROL-XL) 25 MG 24 hr tablet Take 1 tablet (25 mg total) by mouth daily. 30 tablet 2  . naproxen (NAPROSYN) 500 MG tablet Take 1 tablet (500 mg total) by mouth 2 (two) times daily. 30 tablet 0  . topiramate (TOPAMAX) 25 MG tablet Take 1 tablet (25 mg total) by mouth 2 (two) times daily. 180 tablet 0  . HYDROcodone-acetaminophen (NORCO/VICODIN) 5-325 MG tablet Take 1 tablet by mouth every 6 (six) hours as needed for moderate pain. (Patient not taking: Reported on 09/19/2019) 14 tablet 0   No facility-administered medications prior to visit.    No Known Allergies  ROS Review of Systems  Constitutional: Negative for fatigue, fever and unexpected weight change.  HENT: Negative for congestion, rhinorrhea, sinus pressure and sinus pain.   Eyes: Negative for visual disturbance.  Respiratory: Negative for cough, chest tightness and shortness of breath.   Cardiovascular: Negative for chest pain, palpitations and leg swelling.  Gastrointestinal: Negative for abdominal distention, abdominal pain, constipation, diarrhea, nausea and vomiting.  Endocrine: Negative for cold intolerance, heat intolerance, polydipsia and polyuria.  Genitourinary: Negative for decreased urine volume, difficulty urinating and dysuria.  Musculoskeletal: Negative for arthralgias and myalgias.  Skin: Negative for color change and rash.       Bump on left buttock  Neurological: Negative for dizziness, tremors, weakness, numbness and headaches.  Hematological: Does not bruise/bleed easily.  Psychiatric/Behavioral: Negative for agitation and behavioral problems.      Objective:    Physical Exam  Constitutional: She is oriented to person, place, and time. She appears well-developed and well-nourished.  HENT:  Head: Normocephalic and  atraumatic.  No scarring noted to scalp.  No focal areas of hair loss.  No lesions noted.   Eyes: Pupils are equal, round, and reactive to light. Conjunctivae and EOM are normal.  Cardiovascular: Normal rate, regular rhythm, normal heart sounds and intact distal pulses.  No murmur heard. Pulmonary/Chest: Effort normal and breath sounds normal. No respiratory distress. She has no wheezes.  Abdominal: Soft. Bowel sounds are normal. She exhibits no distension. There is no abdominal tenderness.  Musculoskeletal:        General: Normal range of motion.  Neurological: She is alert and oriented to person, place, and time.  Skin: Skin is warm and dry. No rash noted.  Subcutaneous nodule noted to left buttock.  Tender to palpation.  No erythema, edema.  Skin intact.  Psychiatric: She has a normal mood and affect. Her behavior is normal.  Nursing note and vitals reviewed.   BP 110/71   Pulse 67   Ht 5\' 2"  (1.575 m)   Wt 170 lb (77.1 kg)   SpO2 99%   BMI 31.09 kg/m  Wt Readings from Last 3 Encounters:  09/19/19 170 lb (77.1 kg)  08/16/19 174 lb 3.2 oz (79 kg)  07/03/19 169 lb (76.7 kg)     There are no preventive care reminders to display for this patient.  There are no preventive care reminders to display for this patient.  Lab Results  Component Value Date   TSH <0.01 (L) 08/13/2019   Lab Results  Component Value Date   WBC 6.9 07/28/2019   HGB 16.4 (H) 07/28/2019   HCT 45.2 07/28/2019   MCV 89.7 07/28/2019   PLT 213 07/28/2019   Lab Results  Component Value Date   NA 137 07/28/2019   K 3.8 07/28/2019   CO2 23 07/28/2019   GLUCOSE 121 (H) 07/28/2019   BUN 6 07/28/2019   CREATININE 0.63 07/28/2019   BILITOT 0.5 05/28/2019   ALKPHOS 107 05/28/2019   AST 21 05/28/2019   ALT 25 05/28/2019   PROT 7.3 05/28/2019   ALBUMIN 4.3 05/28/2019   CALCIUM 9.2 07/28/2019   ANIONGAP 8 07/28/2019   Lab Results  Component Value Date   CHOL 112 11/15/2016   Lab Results    Component Value Date   HDL 24 (L) 11/15/2016   Lab Results  Component Value Date   LDLCALC 56 11/15/2016   Lab Results  Component Value Date   TRIG 162 (H) 11/15/2016   Lab Results  Component Value Date   CHOLHDL 4.7 (H) 11/15/2016   Lab Results  Component Value Date   HGBA1C 5.2 11/15/2016      Assessment & Plan:   1. 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   No orders of the defined types were placed in this encounter.   Follow-up: Return for medical conditions, keep previously scheduled appointment.    Janann August, RN   Evaluation and management procedures were performed by me with DNP Student in attendance, note written by DNP student under my supervision and collaboration. I have reviewed the note and I agree with the management and plan.   I saw Ms. Carolin Coy for chronic disease management last month and she presents today with complaint of alopecia suspicious for Telogen effluvium. Even though she is unable to identify any specific inciting stressor, she does endorse some stressors in her extended family.  Reassurance has been provided with regards to her alopecia.  Thyroid disorder can also be a contributing factor.  She has also been reassured regarding the injection site reaction in her left butt cheek and has been advised to apply ice; no alarming symptoms at this time.   [name], MD, FAAFP. [facility] [place], [place] [phone]   09/19/2019, 1:17 PM

## 2019-10-15 ENCOUNTER — Other Ambulatory Visit: Payer: Self-pay

## 2019-10-15 ENCOUNTER — Other Ambulatory Visit (INDEPENDENT_AMBULATORY_CARE_PROVIDER_SITE_OTHER): Payer: Self-pay

## 2019-10-15 DIAGNOSIS — E0590 Thyrotoxicosis, unspecified without thyrotoxic crisis or storm: Secondary | ICD-10-CM

## 2019-10-15 LAB — T4, FREE: Free T4: 1.21 ng/dL (ref 0.60–1.60)

## 2019-10-15 LAB — TSH: TSH: <0.01 u[IU]/mL — ABNORMAL LOW (ref 0.35–4.50)

## 2019-10-15 LAB — T3, FREE: T3, Free: 3.6 pg/mL (ref 2.3–4.2)

## 2019-10-18 ENCOUNTER — Ambulatory Visit (INDEPENDENT_AMBULATORY_CARE_PROVIDER_SITE_OTHER): Payer: Self-pay | Admitting: Endocrinology

## 2019-10-18 ENCOUNTER — Encounter: Payer: Self-pay | Admitting: Endocrinology

## 2019-10-18 ENCOUNTER — Other Ambulatory Visit: Payer: Self-pay

## 2019-10-18 VITALS — BP 120/74 | HR 68 | Ht 62.0 in | Wt 170.2 lb

## 2019-10-18 DIAGNOSIS — E0590 Thyrotoxicosis, unspecified without thyrotoxic crisis or storm: Secondary | ICD-10-CM

## 2019-10-18 NOTE — Progress Notes (Signed)
Patient ID: Tracy Carson, female   DOB: Dec 18, 1980, 39 y.o.   MRN: [id number]                                                                                                               Reason for Appointment:  Hyperthyroidism, follow-up visit    Chief complaint: Follow-up of thyroid   History of Present Illness:   Prior history: Her hyperthyroidism was diagnosed in 2012 before a pregnancy At that time she had symptoms of palpitations, shakiness, feeling excessively warm, difficulty sleeping, increased appetite and hair loss She was treated with methimazole She was recommended I-131 treatment in 2014 but even though she had an uptake test done she did not go for the treatment Subsequently has been very irregular with her follow-up program and medications She says she was taking her methimazole 2 tablets twice a day every third day or so because of cost Because of worsening symptoms of palpitations, feeling excessively hot and losing weight she presented to the emergency room on 11/08/16 and was admitted for 2 days  On her initial consultation in her free T4 was still very high at 3.04 and she was having problems with fatigue, weakness and heat intolerance along with tachycardia. Her methimazole was increased to 3 tablets twice a day initially   She finally agreed to do that I-131 treatment when she was seen in June and this was done with 19.5 mCi on 02/16/17  RECENT history  In October 2018 she had normal thyroid functions and had no complaints but did not follow-up as directed subsequently  In June 2020 at the ER she thought she had swollen glands in her neck along with anxiety and palpitations At that time her thyroid levels showed hyperthyroidism and free T4 was significantly high at 2.3 She was started on methimazole 10 mg twice daily by her PCP on 01/11/2019  On her return back in August 2020 for endocrinology management her methimazole was further adjusted In September  2020 she was told to increase methimazole further to 10 mg This was not changed on her last visit  She does not feel unusually tired, only some sleepiness at times Weight is stable No symptoms of heat intolerance or shakiness Also continues to have some hair loss Still taking metoprolol and not having palpitations  She is taking her methimazole 10 mg daily   Her free T4 levels are normal again but slightly higher than before, TSH still suppressed   Wt Readings from Last 3 Encounters:  10/18/19 170 lb 3.2 oz (77.2 kg)  09/19/19 170 lb (77.1 kg)  08/16/19 174 lb 3.2 oz (79 kg)    Thyroid function tests as follows:     Lab Results  Component Value Date   FREET4 1.21 10/15/2019   FREET4 0.88 08/13/2019   FREET4 0.82 05/24/2019   T3FREE 3.6 10/15/2019   T3FREE 3.2 05/24/2019   T3FREE 4.3 (H) 04/10/2019   TSH <0.01 (L) 10/15/2019   TSH <0.01 (L) 08/13/2019   TSH <0.01 (L) 04/10/2019  No results found for: THYROTRECAB   Allergies as of 10/18/2019   No Known Allergies     Medication List       Accurate as of October 18, 2019 12:41 PM. If you have any questions, ask your nurse or doctor.        HYDROcodone-acetaminophen 5-325 MG tablet Commonly known as: NORCO/VICODIN Take 1 tablet by mouth every 6 (six) hours as needed for moderate pain.   hydrOXYzine 25 MG tablet Commonly known as: ATARAX/VISTARIL Take 1 tablet (25 mg total) by mouth 3 (three) times daily as needed.   methimazole 10 MG tablet Commonly known as: TAPAZOLE Take 1 tablet (10 mg total) by mouth daily.   metoprolol succinate 25 MG 24 hr tablet Commonly known as: TOPROL-XL Take 1 tablet (25 mg total) by mouth daily.   naproxen 500 MG tablet Commonly known as: NAPROSYN Take 1 tablet (500 mg total) by mouth 2 (two) times daily.   topiramate 25 MG tablet Commonly known as: TOPAMAX Take 1 tablet (25 mg total) by mouth 2 (two) times daily.           Past Medical History:  Diagnosis Date  .  Hypertension   . Thyroid disease    hyper  . Tuberculosis    as a child    Past Surgical History:  Procedure Laterality Date  . CESAREAN SECTION  07/02/2011   Procedure: CESAREAN SECTION;  Surgeon: Roseanna Rainbow, MD;  Location: [facility];  Service: Gynecology;  Laterality: N/A;  . CHOLECYSTECTOMY  2002    Family History  Problem Relation Age of Onset  . Diabetes Mother   . Hypertension Father   . Thyroid disease Paternal Grandmother   . Thyroid disease Sister   . Thyroid disease Brother     Social History:  reports that she has been smoking cigars. She has a 5.00 pack-year smoking history. She has quit using smokeless tobacco. She reports that she does not drink alcohol or use drugs.  Allergies: No Known Allergies   Review of Systems     Having regular menstrual cycles   Examination:   BP 120/74 (BP Location: Left Arm, Patient Position: Sitting, Cuff Size: Normal)   Pulse 68   Ht 5\' 2"  (1.575 m)   Wt 170 lb 3.2 oz (77.2 kg)   SpO2 99%   BMI 31.13 kg/m   Eyes are mildly prominent Thyroid not palpable No tremor Has mild diffuse alopecia    Assessment/Plan:   Graves' disease, treated with radioactive iodine in July 2018  She has had a recurrence of her hyperthyroidism in 2020  With 10 mg of methimazole her thyroid levels are in the normal range TSH suppressed however  Not clear why she is having hair loss despite normal thyroid levels and likely unrelated  Since her free T4 is trending slightly higher discussed that she will likely get into remission again She agrees to do the I-131 treatment Discussed how to adjust methimazole around the test dose and procedures    Patient Instructions  Both for the iodine test dose and the treatment dose you will stop the methimazole 5 days before. After you have the radioactive iodine treatment you will start back on methimazole 3 days later You will need to see you for about 1 month after the radioactive iodine  treatment  Tanto para la dosis de prueba de yodo como para la dosis de Tracy Carson, dejar de tomar metimazol 5 [employer]. Despus de recibir Scientist, research (medical) con yodo  radiactivo, volver a tomar metimazol 3 das despus. Deber verlo durante aproximadamente 1 mes despus del tratamiento con yodo radiactivo.     Reather Littler 10/18/2019, 12:41 PM    Note: This office note was prepared with Dragon voice recognition system technology. Any transcriptional errors that result from this process are unintentional.

## 2019-10-18 NOTE — Patient Instructions (Signed)
Both for the iodine test dose and the treatment dose you will stop the methimazole 5 days before. After you have the radioactive iodine treatment you will start back on methimazole 3 days later You will need to see you for about 1 month after the radioactive iodine treatment  Tanto para la dosis de prueba de yodo como para la dosis de [place], dejar de tomar metimazol 5 [employer]. Despus de recibir el tratamiento con yodo radiactivo, volver a tomar metimazol 3 [employer]. Deber verlo durante aproximadamente 1 mes despus del tratamiento con yodo radiactivo.

## 2019-11-12 ENCOUNTER — Other Ambulatory Visit: Payer: Self-pay | Admitting: Physician Assistant

## 2019-11-12 DIAGNOSIS — E0500 Thyrotoxicosis with diffuse goiter without thyrotoxic crisis or storm: Secondary | ICD-10-CM

## 2019-12-04 ENCOUNTER — Other Ambulatory Visit: Payer: Self-pay

## 2019-12-04 ENCOUNTER — Other Ambulatory Visit (INDEPENDENT_AMBULATORY_CARE_PROVIDER_SITE_OTHER): Payer: Self-pay

## 2019-12-04 DIAGNOSIS — E0590 Thyrotoxicosis, unspecified without thyrotoxic crisis or storm: Secondary | ICD-10-CM

## 2019-12-04 LAB — T4, FREE: Free T4: 0.85 ng/dL (ref 0.60–1.60)

## 2019-12-06 ENCOUNTER — Telehealth: Payer: Self-pay | Admitting: Endocrinology

## 2019-12-06 ENCOUNTER — Other Ambulatory Visit: Payer: Self-pay | Admitting: Endocrinology

## 2019-12-06 DIAGNOSIS — E0590 Thyrotoxicosis, unspecified without thyrotoxic crisis or storm: Secondary | ICD-10-CM

## 2019-12-06 NOTE — Telephone Encounter (Signed)
Called interpreting service and used Spanish interpreter [phone] and called pt. Pt did not answer, however, detailed voicemail with all of MD message was left and requested that she call this office back upon receiving this message.

## 2019-12-06 NOTE — Telephone Encounter (Signed)
Her appointment for tomorrow has not been canceled, Tracy Carson was informed about this.  She will be seen at the end of June with labs. Also remind her that she will stop her methimazole on 5/18 since she is getting I-131 uptake on 5/25.  She will then start back methimazole and stop it again 1 week before the radioactive iodine treatment date.  Treatment date will be scheduled later

## 2019-12-07 ENCOUNTER — Ambulatory Visit: Payer: Self-pay | Admitting: Endocrinology

## 2019-12-11 ENCOUNTER — Encounter (HOSPITAL_COMMUNITY): Payer: Self-pay

## 2019-12-11 ENCOUNTER — Ambulatory Visit (HOSPITAL_COMMUNITY)
Admission: EM | Admit: 2019-12-11 | Discharge: 2019-12-11 | Disposition: A | Discharge status: Home / Self Care | Payer: Self-pay | Attending: Urgent Care | Admitting: Urgent Care

## 2019-12-11 ENCOUNTER — Other Ambulatory Visit: Payer: Self-pay

## 2019-12-11 DIAGNOSIS — E0590 Thyrotoxicosis, unspecified without thyrotoxic crisis or storm: Secondary | ICD-10-CM

## 2019-12-11 DIAGNOSIS — R0789 Other chest pain: Secondary | ICD-10-CM

## 2019-12-11 MED ORDER — TIZANIDINE HCL 4 MG PO TABS: 4.0000 mg | ORAL_TABLET | Freq: Every day | ORAL | 0 refills | Status: DC

## 2019-12-11 MED ORDER — MELOXICAM 7.5 MG PO TABS: 7.5000 mg | ORAL_TABLET | Freq: Every day | ORAL | 0 refills | Status: DC

## 2019-12-11 MED ORDER — FAMOTIDINE 20 MG PO TABS: 20.0000 mg | ORAL_TABLET | Freq: Two times a day (BID) | ORAL | 0 refills | Status: DC

## 2019-12-11 NOTE — ED Provider Notes (Signed)
[facility]   MRN: [id number] DOB: Nov 28, 1980  Subjective:   Tracy Carson is a 39 y.o. female presenting for acute onset of midsternal chest pain since this morning.  Patient states that the pain has been throbbing, fairly constant.  She took ibuprofen with some relief.  Denies fever, throat pain, cough, shortness of breath, wheezing.  Patient has a history of hyperthyroidism and is scheduled to have radioablation later this month.  No other chronic conditions.  No current facility-administered medications for this encounter.  Current Outpatient Medications:  .  HYDROcodone-acetaminophen (NORCO/VICODIN) 5-325 MG tablet, Take 1 tablet by mouth every 6 (six) hours as needed for moderate pain., Disp: 14 tablet, Rfl: 0 .  hydrOXYzine (ATARAX/VISTARIL) 25 MG tablet, Take 1 tablet (25 mg total) by mouth 3 (three) times daily as needed., Disp: 60 tablet, Rfl: 1 .  methimazole (TAPAZOLE) 10 MG tablet, Take 1 tablet (10 mg total) by mouth daily., Disp: 30 tablet, Rfl: 2 .  metoprolol succinate (TOPROL-XL) 25 MG 24 hr tablet, Take 1 tablet (25 mg total) by mouth daily., Disp: 30 tablet, Rfl: 2 .  naproxen (NAPROSYN) 500 MG tablet, Take 1 tablet (500 mg total) by mouth 2 (two) times daily., Disp: 30 tablet, Rfl: 0 .  topiramate (TOPAMAX) 25 MG tablet, Take 1 tablet (25 mg total) by mouth 2 (two) times daily., Disp: 180 tablet, Rfl: 0   No Known Allergies  Past Medical History:  Diagnosis Date  . Hypertension   . Thyroid disease    hyper  . 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 CESAREAN SECTION  07/02/2011   Procedure: CESAREAN SECTION;  Surgeon: Roseanna Rainbow, MD;  Location: [facility];  Service: Gynecology;  Laterality: N/A;  . CHOLECYSTECTOMY  2002    Family History  Problem Relation Age of Onset  . Diabetes Mother   . Hypertension Father   . Thyroid disease Paternal Grandmother   . Thyroid disease Sister   . Thyroid  disease Brother     Social History   Tobacco Use  . Smoking status: Current Some Day Smoker    Packs/day: 5.00    Years: 1.00    Pack years: 5.00    Types: Cigars  . Smokeless tobacco: Former Neurosurgeon  . Tobacco comment: 03/28/19 7-8/day  Substance Use Topics  . Alcohol use: No    Comment: quit 10/2018  . Drug use: No    ROS   Objective:   Vitals: BP 126/72 (BP Location: Right Arm)   Pulse 72   Temp 98.2 F (36.8 C) (Oral)   Resp 19   LMP 12/04/2019   SpO2 99%   Physical Exam Constitutional:      General: She is not in acute distress.    Appearance: Normal appearance. She is well-developed. She is not ill-appearing, toxic-appearing or diaphoretic.  HENT:     Head: Normocephalic and atraumatic.     Nose: Nose normal.     Mouth/Throat:     Mouth: Mucous membranes are moist.  Eyes:     Extraocular Movements: Extraocular movements intact.     Pupils: Pupils are equal, round, and reactive to light.  Cardiovascular:     Rate and Rhythm: Normal rate and regular rhythm.     Pulses: Normal pulses.     Heart sounds: Normal heart sounds. No murmur. No friction rub. No gallop.   Pulmonary:     Effort: Pulmonary  effort is normal. No respiratory distress.     Breath sounds: Normal breath sounds. No stridor. No wheezing, rhonchi or rales.  Chest:     Chest wall: Tenderness (mildly reproducible over mid-sternum) present.  Skin:    General: Skin is warm and dry.     Findings: No rash.  Neurological:     Mental Status: She is alert and oriented to person, place, and time.  Psychiatric:        Mood and Affect: Mood normal.        Behavior: Behavior normal.        Thought Content: Thought content normal.     ED ECG REPORT   Date: 12/11/2019  Rate: 69bpm  Rhythm: normal sinus rhythm  QRS Axis: normal  Intervals: normal  ST/T Wave abnormalities: normal  Conduction Disutrbances:none  Narrative Interpretation: Sinus rhythm at 69bpm, comparable to previous ecg.   Old EKG  Reviewed: unchanged  I have personally reviewed the EKG tracing and agree with the computerized printout as noted.   Assessment and Plan :   PDMP not reviewed this encounter.  1. Atypical chest pain   2. Hyperthyroidism     Recommended meloxicam and tizanidine to address musculoskeletal source of her pain given reproducible nature of this.  Also counseled on using Pepcid to address possibility of acid reflux.  Lung sounds are clear, EKG is completely normal and therefore I am reassured that patient is appropriate for outpatient management. Counseled patient on potential for adverse effects with medications prescribed/recommended today, ER and return-to-clinic precautions discussed, patient verbalized understanding.    Wallis Bamberg, [place] 12/11/19 1956

## 2019-12-11 NOTE — ED Triage Notes (Signed)
Pt is here with chest pain that started this morning. Pt has taken Advil to relieve discomfort.

## 2019-12-18 ENCOUNTER — Encounter (HOSPITAL_COMMUNITY)
Admission: RE | Admit: 2019-12-18 | Discharge: 2019-12-18 | Disposition: A | Discharge status: Home / Self Care | Payer: Self-pay | Source: Ambulatory Visit | Attending: Endocrinology | Admitting: Endocrinology

## 2019-12-18 ENCOUNTER — Other Ambulatory Visit: Payer: Self-pay

## 2019-12-18 DIAGNOSIS — E0590 Thyrotoxicosis, unspecified without thyrotoxic crisis or storm: Secondary | ICD-10-CM | POA: Insufficient documentation

## 2019-12-19 ENCOUNTER — Encounter (HOSPITAL_COMMUNITY)
Admission: RE | Admit: 2019-12-19 | Discharge: 2019-12-19 | Disposition: A | Discharge status: Home / Self Care | Payer: Self-pay | Source: Ambulatory Visit | Attending: Endocrinology | Admitting: Endocrinology

## 2019-12-19 MED ORDER — SODIUM IODIDE I-123 7.4 MBQ CAPS
474.0000 | ORAL_CAPSULE | Freq: Once | ORAL | Status: AC
  Administered 2019-12-19: 474 via ORAL

## 2019-12-21 ENCOUNTER — Other Ambulatory Visit: Payer: Self-pay | Admitting: Endocrinology

## 2019-12-21 DIAGNOSIS — E0590 Thyrotoxicosis, unspecified without thyrotoxic crisis or storm: Secondary | ICD-10-CM

## 2020-01-02 ENCOUNTER — Other Ambulatory Visit: Payer: Self-pay | Admitting: Family Medicine

## 2020-01-17 ENCOUNTER — Other Ambulatory Visit: Payer: Self-pay

## 2020-01-17 ENCOUNTER — Ambulatory Visit (HOSPITAL_COMMUNITY)
Admission: RE | Admit: 2020-01-17 | Discharge: 2020-01-17 | Disposition: A | Discharge status: Home / Self Care | Payer: Self-pay | Source: Ambulatory Visit | Attending: Endocrinology | Admitting: Endocrinology

## 2020-01-17 DIAGNOSIS — E0590 Thyrotoxicosis, unspecified without thyrotoxic crisis or storm: Secondary | ICD-10-CM | POA: Insufficient documentation

## 2020-01-17 LAB — PREGNANCY, URINE: Preg Test, Ur: NEGATIVE

## 2020-01-17 MED ORDER — SODIUM IODIDE I 131 CAPSULE
12.1700 | Freq: Once | INTRAVENOUS | Status: AC
  Administered 2020-01-17: 12.17 via ORAL

## 2020-01-18 ENCOUNTER — Other Ambulatory Visit: Payer: Self-pay

## 2020-01-22 ENCOUNTER — Ambulatory Visit: Payer: Self-pay | Admitting: Endocrinology

## 2020-01-30 ENCOUNTER — Other Ambulatory Visit (INDEPENDENT_AMBULATORY_CARE_PROVIDER_SITE_OTHER): Payer: Self-pay

## 2020-01-30 ENCOUNTER — Other Ambulatory Visit: Payer: Self-pay

## 2020-01-30 DIAGNOSIS — E0590 Thyrotoxicosis, unspecified without thyrotoxic crisis or storm: Secondary | ICD-10-CM

## 2020-01-30 LAB — T4, FREE: Free T4: 0.76 ng/dL (ref 0.60–1.60)

## 2020-01-30 LAB — TSH: TSH: <0.01 u[IU]/mL — ABNORMAL LOW (ref 0.35–4.50)

## 2020-02-01 ENCOUNTER — Other Ambulatory Visit: Payer: Self-pay

## 2020-02-01 ENCOUNTER — Encounter: Payer: Self-pay | Admitting: Endocrinology

## 2020-02-01 ENCOUNTER — Ambulatory Visit (INDEPENDENT_AMBULATORY_CARE_PROVIDER_SITE_OTHER): Payer: Self-pay | Admitting: Endocrinology

## 2020-02-01 VITALS — BP 102/70 | HR 70 | Ht 62.0 in | Wt 172.0 lb

## 2020-02-01 DIAGNOSIS — E0500 Thyrotoxicosis with diffuse goiter without thyrotoxic crisis or storm: Secondary | ICD-10-CM

## 2020-02-01 NOTE — Progress Notes (Signed)
Patient ID: Tracy Carson, female   DOB: 24-Jul-1981, 39 y.o.   MRN: [id number]                                                                                                               Reason for Appointment:  Hyperthyroidism, follow-up visit    Chief complaint: Follow-up of thyroid   History of Present Illness:   Prior history: Her hyperthyroidism was diagnosed in 2012 before a pregnancy At that time she had symptoms of palpitations, shakiness, feeling excessively warm, difficulty sleeping, increased appetite and hair loss She was treated with methimazole She was recommended I-131 treatment in 2014 but even though she had an uptake test done she did not go for the treatment Subsequently has been very irregular with her follow-up program and medications She says she was taking her methimazole 2 tablets twice a day every third day or so because of cost Because of worsening symptoms of palpitations, feeling excessively hot and losing weight she presented to the emergency room on 11/08/16 and was admitted for 2 days  On her initial consultation in her free T4 was still very high at 3.04 and she was having problems with fatigue, weakness and heat intolerance along with tachycardia. Her methimazole was increased to 3 tablets twice a day initially   She finally agreed to do that I-131 treatment and this was done with 19.5 mCi on 02/16/17  RECENT history  In October 2018 she had normal thyroid functions and had no complaints but did not follow-up as directed subsequently  In June 2020 at the ER she thought she had swollen glands in her neck along with anxiety and palpitations At that time her thyroid levels showed hyperthyroidism and free T4 was significantly high at 2.3 She was started on methimazole 10 mg twice daily by her PCP on 01/11/2019  Because of persistent hyperthyroidism she has been treated with 12 mCi of I-131 on 01/17/2020 She was supposed to continue her methimazole but  she misunderstood and has not taken any  She does not feel unusually tired, only had increased sleepiness for 3 days after her I-131 treatment Weight is up slightly No shakiness and overall feels fairly good She has not refilled her metoprolol and is not having palpitations   Her free T4 level is slightly lower than a couple months ago, her TSH is low as expected   Wt Readings from Last 3 Encounters:  02/01/20 172 lb (78 kg)  10/18/19 170 lb 3.2 oz (77.2 kg)  09/19/19 170 lb (77.1 kg)    Thyroid function tests as follows:     Lab Results  Component Value Date   FREET4 0.76 01/30/2020   FREET4 0.85 12/04/2019   FREET4 1.21 10/15/2019   T3FREE 3.6 10/15/2019   T3FREE 3.2 05/24/2019   T3FREE 4.3 (H) 04/10/2019   TSH <0.01 (L) 01/30/2020   TSH <0.01 (L) 10/15/2019   TSH <0.01 (L) 08/13/2019    No results found for: THYROTRECAB   Allergies as of 02/01/2020  No Known Allergies     Medication List       Accurate as of February 01, 2020  9:35 AM. If you have any questions, ask your nurse or doctor.        famotidine 20 MG tablet Commonly known as: PEPCID Take 1 tablet (20 mg total) by mouth 2 (two) times daily.   HYDROcodone-acetaminophen 5-325 MG tablet Commonly known as: NORCO/VICODIN Take 1 tablet by mouth every 6 (six) hours as needed for moderate pain.   hydrOXYzine 25 MG tablet Commonly known as: ATARAX/VISTARIL Take 1 tablet (25 mg total) by mouth 3 (three) times daily as needed.   meloxicam 7.5 MG tablet Commonly known as: Mobic Take 1 tablet (7.5 mg total) by mouth daily.   methimazole 10 MG tablet Commonly known as: TAPAZOLE Take 1 tablet (10 mg total) by mouth daily.   metoprolol succinate 25 MG 24 hr tablet Commonly known as: TOPROL-XL Take 1 tablet (25 mg total) by mouth daily. Must have office visit for refills   naproxen 500 MG tablet Commonly known as: NAPROSYN Take 1 tablet (500 mg total) by mouth 2 (two) times daily.   tiZANidine 4 MG  tablet Commonly known as: Zanaflex Take 1 tablet (4 mg total) by mouth at bedtime.   topiramate 25 MG tablet Commonly known as: TOPAMAX Take 1 tablet (25 mg total) by mouth 2 (two) times daily.           Past Medical History:  Diagnosis Date  . Hypertension   . Thyroid disease    hyper  . Tuberculosis    as a child    Past Surgical History:  Procedure Laterality Date  . CESAREAN SECTION  07/02/2011   Procedure: CESAREAN SECTION;  Surgeon: Roseanna Rainbow, MD;  Location: [facility];  Service: Gynecology;  Laterality: N/A;  . CHOLECYSTECTOMY  2002    Family History  Problem Relation Age of Onset  . Diabetes Mother   . Hypertension Father   . Thyroid disease Paternal Grandmother   . Thyroid disease Sister   . Thyroid disease Brother     Social History:  reports that she has been smoking cigars. She has a 5.00 pack-year smoking history. She has quit using smokeless tobacco. She reports that she does not drink alcohol and does not use drugs.  Allergies: No Known Allergies   Review of Systems  She was having hair loss which is better recently  Having regular menstrual cycles   Examination:   BP 102/70 (BP Location: Left Arm, Patient Position: Sitting, Cuff Size: Normal)   Pulse 70   Ht 5\' 2"  (1.575 m)   Wt 172 lb (78 kg)   SpO2 97%   BMI 31.46 kg/m   Thyroid not palpable No tremor     Assessment/Plan:   Graves' disease, treated with radioactive iodine in July 2018 and again in 12/2019  Even though it has been only about 2 weeks since her I-131 treatment her thyroid levels are quite normal without any methimazole Subjectively doing well No thyroid enlargement has been present this year  Discussed potential for hypothyroidism within the next month or so and possible symptoms She will come back in about 4 weeks for reassessment but she will call if she starts feeling lethargic before that    There are no Patient Instructions on file for this  visit.   Reather Littler 02/01/2020, 9:35 AM    Note: This office note was prepared with Dragon voice recognition system technology. Any  transcriptional errors that result from this process are unintentional.

## 2020-02-13 ENCOUNTER — Emergency Department (HOSPITAL_COMMUNITY)
Admission: EM | Admit: 2020-02-13 | Discharge: 2020-02-14 | Disposition: A | Discharge status: Home / Self Care | Payer: Self-pay | Attending: Emergency Medicine | Admitting: Emergency Medicine

## 2020-02-13 ENCOUNTER — Other Ambulatory Visit: Payer: Self-pay

## 2020-02-13 ENCOUNTER — Emergency Department (HOSPITAL_COMMUNITY): Payer: Self-pay

## 2020-02-13 ENCOUNTER — Encounter (HOSPITAL_COMMUNITY): Payer: Self-pay | Admitting: *Deleted

## 2020-02-13 DIAGNOSIS — Z5321 Procedure and treatment not carried out due to patient leaving prior to being seen by health care provider: Secondary | ICD-10-CM | POA: Insufficient documentation

## 2020-02-13 DIAGNOSIS — R079 Chest pain, unspecified: Secondary | ICD-10-CM | POA: Insufficient documentation

## 2020-02-13 LAB — TROPONIN I (HIGH SENSITIVITY)
Troponin I (High Sensitivity): 3 ng/L (ref <18)
Troponin I (High Sensitivity): 4 ng/L (ref <18)

## 2020-02-13 LAB — CBC
HCT: 44.8 % (ref 36.0–46.0)
Hemoglobin: 15.8 g/dL — ABNORMAL HIGH (ref 12.0–15.0)
MCH: 31 pg (ref 26.0–34.0)
MCHC: 35.3 g/dL (ref 30.0–36.0)
MCV: 87.8 fL (ref 80.0–100.0)
Platelets: 214 10*3/uL (ref 150–400)
RBC: 5.1 MIL/uL (ref 3.87–5.11)
RDW: 12 % (ref 11.5–15.5)
WBC: 10.4 10*3/uL (ref 4.0–10.5)
nRBC: 0 % (ref 0.0–0.2)

## 2020-02-13 LAB — I-STAT BETA HCG BLOOD, ED (MC, WL, AP ONLY): I-stat hCG, quantitative: <5 m[IU]/mL (ref <5)

## 2020-02-13 LAB — BASIC METABOLIC PANEL
Anion gap: 9 (ref 5–15)
BUN: 8 mg/dL (ref 6–20)
CO2: 18 mmol/L — ABNORMAL LOW (ref 22–32)
Calcium: 8.8 mg/dL — ABNORMAL LOW (ref 8.9–10.3)
Chloride: 109 mmol/L (ref 98–111)
Creatinine, Ser: 0.64 mg/dL (ref 0.44–1.00)
GFR calc Af Amer: >60 mL/min (ref >60)
GFR calc non Af Amer: >60 mL/min (ref >60)
Glucose, Bld: 100 mg/dL — ABNORMAL HIGH (ref 70–99)
Potassium: 3.6 mmol/L (ref 3.5–5.1)
Sodium: 136 mmol/L (ref 135–145)

## 2020-02-13 MED ORDER — SODIUM CHLORIDE 0.9% FLUSH: 3.0000 mL | Freq: Once | INTRAVENOUS | Status: DC

## 2020-02-13 NOTE — ED Triage Notes (Signed)
The pt reports chest pain  Central for 2 days   No sob dizziness or nausea  lmp lastmonth

## 2020-02-14 ENCOUNTER — Ambulatory Visit (HOSPITAL_COMMUNITY)
Admission: EM | Admit: 2020-02-14 | Discharge: 2020-02-14 | Disposition: A | Discharge status: Home / Self Care | Payer: Self-pay

## 2020-02-14 ENCOUNTER — Other Ambulatory Visit: Payer: Self-pay

## 2020-02-14 ENCOUNTER — Encounter (HOSPITAL_COMMUNITY): Payer: Self-pay

## 2020-02-14 DIAGNOSIS — R079 Chest pain, unspecified: Secondary | ICD-10-CM

## 2020-02-14 NOTE — Discharge Instructions (Addendum)
Your EKG, blood work and chest x-ray from the ER were all normal. Recommend you go home and get some rest You can try doing ibuprofen or Aleve for pain Recommend Pepcid in case this is acid reflux related. Recommend call your doctor for follow-up

## 2020-02-14 NOTE — ED Provider Notes (Signed)
[facility]    CSN: [id number] Arrival date & time: 02/14/20  1607      History   Chief Complaint Chief Complaint  Patient presents with  . Chest Pain    HPI Tracy Carson is a 39 y.o. female.   Patient is a 39 year old female with past medical history of hypertension, hyperthyroidism.  She presents today with left chest pain.  This began yesterday morning.  Reporting the pain woke her up out of her sleep.  Describes the pain as pressure.  Some pain exacerbated with movements.  Patient went to the ER and waited for 14 hours.  She had normal EKG, normal chest x-ray, negative troponin there.  There was no electrolyte abnormality and CBC was normal.  Reports that her chest pain has gotten better.  She was initially taken to the ER by EMS which she was given aspirin and 2 nitro in route to the ER which she reports helped her symptoms some.  No known family history of cardiac disease.  Current everyday smoker.  No associated shortness of breath, dizziness, nausea, vomiting, fevers or cough.  Does also have history of anxiety.  ROS per HPI      Past Medical History:  Diagnosis Date  . Hypertension   . Thyroid disease    hyper  . Tuberculosis    as a child    Patient Active Problem List   Diagnosis Date Noted  . 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  . History of blurry vision 05/20/2014  . History of itching of eye 05/20/2014  . Other fatigue 05/20/2014  . Tobacco dependence 05/20/2014  . Palpitations 05/20/2014  . Abdominal pain 11/03/2012  . 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 Tachycardia 11/03/2012  . Hematochezia 11/03/2012  . Graves disease 11/03/2012  . Anemia 11/03/2012  . Breech presentation 07/02/2011  . Cesarean delivery, without mention of indication, delivered, with or without mention of antepartum condition 07/02/2011    Past Surgical History:  Procedure Laterality Date  . CESAREAN SECTION  07/02/2011   Procedure: CESAREAN SECTION;  Surgeon: Roseanna Rainbow,  MD;  Location: [facility];  Service: Gynecology;  Laterality: N/A;  . CHOLECYSTECTOMY  2002    OB History    Gravida  4   Para  4   Term  4   Preterm      AB      Living  4     SAB      TAB      Ectopic      Multiple      Live Births  3            Home Medications    Prior to Admission medications   Medication Sig Start Date End Date Taking? Authorizing Provider  famotidine (PEPCID) 20 MG tablet Take 1 tablet (20 mg total) by mouth 2 (two) times daily. 12/11/19   Wallis Bamberg, PA-C  hydrOXYzine (ATARAX/VISTARIL) 25 MG tablet Take 1 tablet (25 mg total) by mouth 3 (three) times daily as needed. 08/01/19   [name], MD  meloxicam (MOBIC) 7.5 MG tablet Take 1 tablet (7.5 mg total) by mouth daily. 12/11/19   Wallis Bamberg, PA-C  naproxen (NAPROSYN) 500 MG tablet Take 1 tablet (500 mg total) by mouth 2 (two) times daily. 03/10/19   Wallis Bamberg, PA-C  tiZANidine (ZANAFLEX) 4 MG tablet Take 1 tablet (4 mg total) by mouth at bedtime. 12/11/19   Wallis Bamberg, PA-C  topiramate (TOPAMAX) 25 MG tablet Take 1 tablet (25 mg  total) by mouth 2 (two) times daily. 05/24/19   Lomax, Amy, NP    Family History Family History  Problem Relation Age of Onset  . Diabetes Mother   . Hypertension Father   . Thyroid disease Paternal Grandmother   . Thyroid disease Sister   . Thyroid disease Brother     Social History Social History   Tobacco Use  . Smoking status: Current Some Day Smoker    Packs/day: 5.00    Years: 1.00    Pack years: 5.00    Types: Cigars  . Smokeless tobacco: Former Neurosurgeon  . Tobacco comment: 03/28/19 7-8/day  Substance Use Topics  . Alcohol use: No    Comment: quit 10/2018  . Drug use: No     Allergies   Patient has no known allergies.   Review of Systems Review of Systems   Physical Exam Triage Vital Signs ED Triage Vitals  Enc Vitals Group     BP 02/14/20 0824 130/75     Pulse Rate 02/14/20 0824 (!) 53     Resp 02/14/20 0824 15     Temp 02/14/20  0824 98.8 F (37.1 C)     Temp src --      SpO2 02/14/20 0824 100 %     Weight --      Height --      Head Circumference --      Peak Flow --      Pain Score 02/14/20 0822 6     Pain Loc --      Pain Edu? --      Excl. in GC? --    No data found.  Updated Vital Signs BP 130/75   Pulse (!) 53   Temp 98.8 F (37.1 C)   Resp 15   LMP 01/15/2020 (Approximate)   SpO2 100%   Visual Acuity Right Eye Distance:   Left Eye Distance:   Bilateral Distance:    Right Eye Near:   Left Eye Near:    Bilateral Near:     Physical Exam Vitals and nursing note reviewed.  Constitutional:      General: She is not in acute distress.    Appearance: Normal appearance. She is not ill-appearing, toxic-appearing or diaphoretic.  HENT:     Head: Normocephalic.     Nose: Nose normal.     Mouth/Throat:     Pharynx: Oropharynx is clear.  Eyes:     Conjunctiva/sclera: Conjunctivae normal.  Cardiovascular:     Rate and Rhythm: Normal rate and regular rhythm.     Heart sounds: Normal heart sounds.  Pulmonary:     Effort: Pulmonary effort is normal.     Breath sounds: Normal breath sounds.  Musculoskeletal:        General: Normal range of motion.     Cervical back: Normal range of motion.  Skin:    General: Skin is warm and dry.     Findings: No rash.  Neurological:     Mental Status: She is alert.  Psychiatric:        Mood and Affect: Mood normal.      UC Treatments / Results  Labs (all labs ordered are listed, but only abnormal results are displayed) Labs Reviewed - No data to display  EKG   Radiology DG Chest 2 View  Result Date: 02/13/2020 CLINICAL DATA:  Chest pain EXAM: CHEST - 2 VIEW COMPARISON:  07/28/2019 FINDINGS: The heart size and mediastinal contours are within normal limits. Both  lungs are clear. The visualized skeletal structures are unremarkable. IMPRESSION: No active cardiopulmonary disease. Electronically Signed   By: Helyn Numbers MD   On: 02/13/2020 18:10      Procedures Procedures (including critical care time)  Medications Ordered in UC Medications - No data to display  Initial Impression / Assessment and Plan / UC Course  I have reviewed the triage vital signs and the nursing notes.  Pertinent labs & imaging results that were available during my care of the patient were reviewed by me and considered in my medical decision making (see chart for details).     Chest pain EKG with normal sinus rhythm and normal rate done in the ER.  Blood work from ER with negative troponins, no electrolyte abnormalities and normal CBC.  Chest x-ray normal in the ER also. Not terribly concerned for ACS at this time. Patient symptoms have somewhat improved and her only complaint now is being tired. Recommended she go home and rest, try taking ibuprofen or Aleve for pain.  She also has a history of acid reflux she can try doing Pepcid to see if this helps. Recommended if symptoms return or worsen she will need to go back to the ER for further management. Otherwise she can call her primary care doctor as needed for follow-up Final Clinical Impressions(s) / UC Diagnoses   Final diagnoses:  Chest pain, unspecified type     Discharge Instructions     Your EKG, blood work and chest x-ray from the ER were all normal. Recommend you go home and get some rest You can try doing ibuprofen or Aleve for pain Recommend Pepcid in case this is acid reflux related. Recommend call your doctor for follow-up     ED Prescriptions    None     PDMP not reviewed this encounter.   Janace Aris, NP 02/14/20 1437

## 2020-02-14 NOTE — ED Notes (Signed)
Urgent care called stating patient is trying to check in and requesting we discharge them out.

## 2020-02-14 NOTE — ED Triage Notes (Signed)
Patient report chest pain that began yesterday morning. States she waited in the ED, but decided to come here after waiting 14 hours.  Denies: ShOB, dizziness, nausea

## 2020-03-03 ENCOUNTER — Other Ambulatory Visit (INDEPENDENT_AMBULATORY_CARE_PROVIDER_SITE_OTHER): Payer: Self-pay

## 2020-03-03 ENCOUNTER — Other Ambulatory Visit: Payer: Self-pay

## 2020-03-03 DIAGNOSIS — E0500 Thyrotoxicosis with diffuse goiter without thyrotoxic crisis or storm: Secondary | ICD-10-CM

## 2020-03-03 LAB — TSH: TSH: 3.28 u[IU]/mL (ref 0.35–4.50)

## 2020-03-03 LAB — T4, FREE: Free T4: 0.41 ng/dL — ABNORMAL LOW (ref 0.60–1.60)

## 2020-03-05 ENCOUNTER — Ambulatory Visit (INDEPENDENT_AMBULATORY_CARE_PROVIDER_SITE_OTHER): Payer: Self-pay | Admitting: Endocrinology

## 2020-03-05 ENCOUNTER — Encounter: Payer: Self-pay | Admitting: Endocrinology

## 2020-03-05 ENCOUNTER — Other Ambulatory Visit: Payer: Self-pay

## 2020-03-05 VITALS — BP 110/80 | HR 67 | Ht 62.0 in | Wt 174.8 lb

## 2020-03-05 DIAGNOSIS — E890 Postprocedural hypothyroidism: Secondary | ICD-10-CM

## 2020-03-05 MED ORDER — LEVOTHYROXINE SODIUM 100 MCG PO TABS: 100.0000 ug | ORAL_TABLET | Freq: Every day | ORAL | 3 refills | Status: DC

## 2020-03-05 NOTE — Progress Notes (Signed)
Patient ID: Tracy Carson, female   DOB: Oct 18, 1980, 39 y.o.   MRN: [id number]                                                                                                               Reason for Appointment:  Hyperthyroidism, follow-up visit    Chief complaint: Follow-up of thyroid   History of Present Illness:   Prior history: Her hyperthyroidism was diagnosed in 2012 before a pregnancy At that time she had symptoms of palpitations, shakiness, feeling excessively warm, difficulty sleeping, increased appetite and hair loss She was treated with methimazole She was recommended I-131 treatment in 2014 but even though she had an uptake test done she did not go for the treatment Subsequently has been very irregular with her follow-up program and medications She says she was taking her methimazole 2 tablets twice a day every third day or so because of cost Because of worsening symptoms of palpitations, feeling excessively hot and losing weight she presented to the emergency room on 11/08/16 and was admitted for 2 days  On her initial consultation in her free T4 was still very high at 3.04 and she was having problems with fatigue, weakness and heat intolerance along with tachycardia. Her methimazole was increased to 3 tablets twice a day initially   She finally agreed to do that I-131 treatment and this was done with 19.5 mCi on 02/16/17  RECENT history  In October 2018 she had normal thyroid functions and had no complaints but did not follow-up as directed subsequently In June 2020 at the ER she thought she had swollen glands in her neck along with anxiety and palpitations At that time her thyroid levels showed hyperthyroidism and free T4 was significantly high at 2.3 She was started on methimazole 10 mg twice daily by her PCP on 01/11/2019  Because of persistent hyperthyroidism she has been treated with 12 mCi of I-131 on 01/17/2020 Recently feels fairly well and has not been on  methimazole Weight is up 2 pounds Did not complain of any significant change in energy level or any symptoms of hair loss May occasionally feel cold intolerance  Her free T4 level is significantly lower with TSH 3.3 compared to suppressed levels    Wt Readings from Last 3 Encounters:  03/05/20 174 lb 12.8 oz (79.3 kg)  02/13/20 175 lb 14.8 oz (79.8 kg)  02/01/20 172 lb (78 kg)    Thyroid function tests as follows:     Lab Results  Component Value Date   FREET4 0.41 (L) 03/03/2020   FREET4 0.76 01/30/2020   FREET4 0.85 12/04/2019   T3FREE 3.6 10/15/2019   T3FREE 3.2 05/24/2019   T3FREE 4.3 (H) 04/10/2019   TSH 3.28 03/03/2020   TSH <0.01 (L) 01/30/2020   TSH <0.01 (L) 10/15/2019    No results found for: THYROTRECAB   Allergies as of 03/05/2020   No Known Allergies     Medication List       Accurate as of March 05, 2020 11:26  AM. If you have any questions, ask your nurse or doctor.        famotidine 20 MG tablet Commonly known as: PEPCID Take 1 tablet (20 mg total) by mouth 2 (two) times daily.   hydrOXYzine 25 MG tablet Commonly known as: ATARAX/VISTARIL Take 1 tablet (25 mg total) by mouth 3 (three) times daily as needed.   meloxicam 7.5 MG tablet Commonly known as: Mobic Take 1 tablet (7.5 mg total) by mouth daily.   naproxen 500 MG tablet Commonly known as: NAPROSYN Take 1 tablet (500 mg total) by mouth 2 (two) times daily.   tiZANidine 4 MG tablet Commonly known as: Zanaflex Take 1 tablet (4 mg total) by mouth at bedtime.   topiramate 25 MG tablet Commonly known as: TOPAMAX Take 1 tablet (25 mg total) by mouth 2 (two) times daily.           Past Medical History:  Diagnosis Date  . Hypertension   . Thyroid disease    hyper  . Tuberculosis    as a child    Past Surgical History:  Procedure Laterality Date  . CESAREAN SECTION  07/02/2011   Procedure: CESAREAN SECTION;  Surgeon: Roseanna Rainbow, MD;  Location: [facility];  Service:  Gynecology;  Laterality: N/A;  . CHOLECYSTECTOMY  2002    Family History  Problem Relation Age of Onset  . Diabetes Mother   . Hypertension Father   . Thyroid disease Paternal Grandmother   . Thyroid disease Sister   . Thyroid disease Brother     Social History:  reports that she has been smoking cigars. She has a 5.00 pack-year smoking history. She has quit using smokeless tobacco. She reports that she does not drink alcohol and does not use drugs.  Allergies: No Known Allergies   Review of Systems  She was having hair loss which is not a problem as much  Having regular menstrual cycles  She says she did not take Covid vaccine because of having thyroid problems and blood pressure issues   Examination:   BP 110/80 (BP Location: Left Arm, Patient Position: Sitting, Cuff Size: Large)   Pulse 67   Ht 5\' 2"  (1.575 m)   Wt 174 lb 12.8 oz (79.3 kg)   SpO2 97%   BMI 31.97 kg/m   Reflexes difficult to elicit     Assessment/Plan:   Graves' disease, treated with radioactive iodine in July 2018 and again in 12/2019  She is now hypothyroid although no significant symptoms as yet  Start levothyroxine 0.1 mg daily and follow-up in 8 weeks, I will discuss taking this in the morning and avoiding calcium and iron supplements   Discussed in detail the need and safety of Covid vaccine and urged her to take this.  Patient information given  There are no Patient Instructions on file for this visit.   Reather Littler 03/05/2020, 11:26 AM    Note: This office note was prepared with Dragon voice recognition system technology. Any transcriptional errors that result from this process are unintentional.

## 2020-03-06 ENCOUNTER — Encounter: Payer: Self-pay | Admitting: Physician Assistant

## 2020-03-06 ENCOUNTER — Ambulatory Visit: Payer: Self-pay | Attending: Physician Assistant | Admitting: Physician Assistant

## 2020-03-06 VITALS — BP 124/76 | HR 68 | Temp 97.7°F | Ht 62.0 in | Wt 175.0 lb

## 2020-03-06 DIAGNOSIS — Z789 Other specified health status: Secondary | ICD-10-CM

## 2020-03-06 DIAGNOSIS — R350 Frequency of micturition: Secondary | ICD-10-CM

## 2020-03-06 DIAGNOSIS — Z603 Acculturation difficulty: Secondary | ICD-10-CM

## 2020-03-06 DIAGNOSIS — E890 Postprocedural hypothyroidism: Secondary | ICD-10-CM | POA: Insufficient documentation

## 2020-03-06 DIAGNOSIS — Z09 Encounter for follow-up examination after completed treatment for conditions other than malignant neoplasm: Secondary | ICD-10-CM

## 2020-03-06 DIAGNOSIS — R072 Precordial pain: Secondary | ICD-10-CM

## 2020-03-06 LAB — POCT URINALYSIS DIP (CLINITEK)
Bilirubin, UA: NEGATIVE
Blood, UA: NEGATIVE
Glucose, UA: NEGATIVE mg/dL
Ketones, POC UA: NEGATIVE mg/dL
Leukocytes, UA: NEGATIVE
Nitrite, UA: NEGATIVE
POC PROTEIN,UA: NEGATIVE
Spec Grav, UA: 1.015 (ref 1.010–1.025)
Urobilinogen, UA: 0.2 E.U./dL
pH, UA: 6.5 (ref 5.0–8.0)

## 2020-03-06 LAB — POCT URINE PREGNANCY: Preg Test, Ur: NEGATIVE

## 2020-03-06 NOTE — Patient Instructions (Signed)
Your urine does not show infection.  Drink 80 ounces water daily, but do not drink anything after 6pm.

## 2020-03-06 NOTE — Progress Notes (Signed)
Patient ID: Tracy Carson, female   DOB: 09-06-1980, 39 y.o.   MRN: [id number]       Tracy Carson, is a 39 y.o. female  OZH:[id number]  GEX:[id number]  DOB - [birth year]  Subjective:  Chief Complaint and HPI: Tracy Carson is a 39 y.o. female here today for a follow up visit After ED visit 02/14/2020 for CP.  No further Cp-she  previously was on metoprolol but this was discontinued.  She still has some and so she takes it "on occasion" if she has CP.    Today she is complaining with urinary frequency at night.  Feels like she has to go to the bathroom and then does not produce much urine.  This has been going on for several days.  No fever.  No abdominal pain.    endocrinologist just changed her thyroid meds yesterday  From ED note: Chest pain EKG with normal sinus rhythm and normal rate done in the ER.  Blood work from ER with negative troponins, no electrolyte abnormalities and normal CBC.  Chest x-ray normal in the ER also. Not terribly concerned for ACS at this time. Patient symptoms have somewhat improved and her only complaint now is being tired. Recommended she go home and rest, try taking ibuprofen or Aleve for pain.  She also has a history of acid reflux she can try doing Pepcid to see if this helps. Recommended if symptoms return or worsen she will need to go back to the ER for further management. Otherwise she can call her primary care doctor as needed for follow-up.   ED/Hospital notes reviewed and summarized above.     ROS:   Constitutional:  No f/c, No night sweats, No unexplained weight loss. EENT:  No vision changes, No blurry vision, No hearing changes. No mouth, throat, or ear problems.  Respiratory: No cough, No SOB Cardiac: No CP, no palpitations GI:  No abd pain, No N/V/D. GU: see above Musculoskeletal: No joint pain Neuro: No headache, no dizziness, no motor weakness.  Skin: No rash Endocrine:  No polydipsia. No polyuria.  Psych: Denies  SI/HI  No problems updated.  ALLERGIES: No Known Allergies  PAST MEDICAL HISTORY: Past Medical History:  Diagnosis Date  . Hypertension   . Thyroid disease    hyper  . 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 Authorizing Provider  levothyroxine (SYNTHROID) 100 MCG tablet Take 1 tablet (100 mcg total) by mouth daily. 03/05/20  Yes Reather Littler, MD  famotidine (PEPCID) 20 MG tablet Take 1 tablet (20 mg total) by mouth 2 (two) times daily. Patient not taking: Reported on 03/05/2020 12/11/19   Wallis Bamberg, PA-C  hydrOXYzine (ATARAX/VISTARIL) 25 MG tablet Take 1 tablet (25 mg total) by mouth 3 (three) times daily as needed. Patient not taking: Reported on 03/05/2020 08/01/19   [name], MD  meloxicam (MOBIC) 7.5 MG tablet Take 1 tablet (7.5 mg total) by mouth daily. Patient not taking: Reported on 03/05/2020 12/11/19   Wallis Bamberg, PA-C  naproxen (NAPROSYN) 500 MG tablet Take 1 tablet (500 mg total) by mouth 2 (two) times daily. Patient not taking: Reported on 03/05/2020 03/10/19   Wallis Bamberg, PA-C  tiZANidine (ZANAFLEX) 4 MG tablet Take 1 tablet (4 mg total) by mouth at bedtime. Patient not taking: Reported on 03/05/2020 12/11/19   Wallis Bamberg, PA-C  topiramate (TOPAMAX) 25 MG tablet Take  1 tablet (25 mg total) by mouth 2 (two) times daily. Patient not taking: Reported on 03/05/2020 05/24/19   Shawnie Dapper, NP     Objective:  EXAM:   Vitals:   03/06/20 1407  BP: 124/76  Pulse: 68  Temp: 97.7 F (36.5 C)  TempSrc: Temporal  SpO2: 97%  Weight: 175 lb (79.4 kg)  Height: 5\' 2"  (1.575 m)    General appearance : A&OX3. NAD. Non-toxic-appearing HEENT: Atraumatic and Normocephalic.  PERRLA. EOM intact.  Neck: supple, no JVD. No cervical lymphadenopathy. No thyromegaly Chest/Lungs:  Breathing-non-labored, Good air entry bilaterally, breath sounds normal without rales, rhonchi, or wheezing  CVS: S1  S2 regular, no murmurs, gallops, rubs  Extremities: Bilateral Lower Ext shows no edema, both legs are warm to touch with = pulse throughout Neurology:  CN II-XII grossly intact, Non focal.   Psych:  TP linear. J/I fair. Normal speech. Appropriate eye contact and affect.  Skin:  No Rash  Data Review Lab Results  Component Value Date   HGBA1C 5.2 11/15/2016     Assessment & Plan   1. Urinary frequency Your urine does not show infection.  Drink 80 ounces water daily, but do not drink anything after 6pm. - POCT URINALYSIS DIP (CLINITEK) - POCT urine pregnancy  2. Precordial chest pain Resolved previously was on metoprolol but this was discontinued.  She still has some and so she takes it "on occasion" if she has CP.  I have advised her not to take old medications that have been discontinued and discussed the dangers as such     3. Encounter for examination following treatment at hospital Discussed that some of her symptoms may improve as she is being prescribed a more therapeutic dose of synthroid that she will start today.   4. Language barrier [employer] interpreters "Coralie Common" used and additional time performing visit was required.      Patient have been counseled extensively about nutrition and exercise  Return in about 3 months (around 06/06/2020) for PCP.  The patient was given clear instructions to go to ER or return to medical center if symptoms don't improve, worsen or new problems develop. The patient verbalized understanding. The patient was told to call to get lab results if they haven't heard anything in the next week.     Georgian Co, PA-C [facility] [place], [place] [phone]   03/06/2020, 2:27 PM

## 2020-03-10 ENCOUNTER — Ambulatory Visit: Payer: Self-pay

## 2020-03-10 NOTE — Telephone Encounter (Signed)
Patient called stating that she has severe abdominal cramping for about 3 months.  She states that the cramping has been becoming more severe over the past several days.  She rates the pain at 10. It will last 1-3 minutes then go away.   She states that it can be in any part of her abdomin. And go low into her bowels. She states that it does not travel into her back or chest. She has no urinary problems. She has no areas on her abdomin that are sore to touch. She has had no constipation diarrhea or nausea vomiting. She has no fever. 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 Her last menstral cycle was end of July.  She states that she has had this pain in past but was told it would go away by the doctor Luna.  Per protocol patient will go to UC for evaluation of her symptoms. No appointment at office today.  Care advice was read to patient.  She verbalized understanding.  All conversation with patient was interpreted by interpreter Tobi Bastos  # [phone].  Reason for Disposition  [1] MILD-MODERATE pain AND [2] constant AND [3] present > 2 hours  Answer Assessment - Initial Assessment Questions 1. LOCATION: "Where does it hurt?"      Any part cramping 2. RADIATION: "Does the pain shoot anywhere else?" (e.g., chest, back)     no 3. ONSET: "When did the pain begin?" (e.g., minutes, hours or days ago)     3 months but getting worse and more frequent 4. SUDDEN: "Gradual or sudden onset?"     Sometimes sudden 5. PATTERN "Does the pain come and go, or is it constant?"    - If constant: "Is it getting better, staying the same, or worsening?"      (Note: Constant means the pain never goes away completely; most serious pain is constant and it progresses)     - If intermittent: "How long does it last?" "Do you have pain now?"     (Note: Intermittent means the pain goes away completely between bouts)     Intermittent strong cramping 6. SEVERITY: "How bad is the pain?"   (e.g., Scale 1-10; mild, moderate, or severe)   - MILD (1-3): doesn't interfere with normal activities, abdomen soft and not tender to touch    - MODERATE (4-7): interferes with normal activities or awakens from sleep, tender to touch    - SEVERE (8-10): excruciating pain, doubled over, unable to do any normal activities      10 when it starts 0 when it dissapears last 1-3 minutes 7. RECURRENT SYMPTOM: "Have you ever had this type of stomach pain before?" If Yes, ask: "When was the last time?" and "What happened that time?"     Just once checked by Dr Doy Mince States Dr said it was ok 8. CAUSE: "What do you think is causing the stomach pain?"    When she puts pressure on her stomach 9. RELIEVING/AGGRAVATING FACTORS: "What makes it better or worse?" (e.g., movement, antacids, bowel movement)    Driving car using stomach  Deep breaths make it better 10. OTHER SYMPTOMS: "Has there been any vomiting, diarrhea, constipation, or urine problems?"       none 11. PREGNANCY: "Is there any chance you are pregnant?" "When was your last menstrual period?"      No does not think she is pregnant Last period last days of july  Protocols used: ABDOMINAL  PAIN [facility]

## 2020-03-13 ENCOUNTER — Encounter (HOSPITAL_COMMUNITY): Payer: Self-pay | Admitting: Emergency Medicine

## 2020-03-13 ENCOUNTER — Other Ambulatory Visit: Payer: Self-pay

## 2020-03-13 ENCOUNTER — Ambulatory Visit (HOSPITAL_COMMUNITY)
Admission: EM | Admit: 2020-03-13 | Discharge: 2020-03-13 | Disposition: A | Discharge status: Home / Self Care | Payer: Self-pay | Attending: Internal Medicine | Admitting: Internal Medicine

## 2020-03-13 DIAGNOSIS — R079 Chest pain, unspecified: Secondary | ICD-10-CM

## 2020-03-13 DIAGNOSIS — R1084 Generalized abdominal pain: Secondary | ICD-10-CM

## 2020-03-13 MED ORDER — FAMOTIDINE 20 MG PO TABS
20.0000 mg | ORAL_TABLET | Freq: Two times a day (BID) | ORAL | 0 refills | Status: DC
Start: 2020-03-13 — End: 2021-09-29

## 2020-03-13 MED ORDER — TIZANIDINE HCL 4 MG PO TABS: 4.0000 mg | ORAL_TABLET | Freq: Every day | ORAL | 0 refills | Status: DC

## 2020-03-13 MED ORDER — NAPROXEN 500 MG PO TABS
500.0000 mg | ORAL_TABLET | Freq: Two times a day (BID) | ORAL | 0 refills | Status: DC
Start: 2020-03-13 — End: 2021-08-26

## 2020-03-13 NOTE — ED Triage Notes (Signed)
Pt presents to [facility] for assessment of left sided chest pain and left arm pain starting before bed last night.  States she woke up this morning and the pain was worse, tried to take Ibuprofen approx 20 minutes ago without relief.  Denies dyspnea, denies light-headedness or dizziness, denies jaw or back pain, denies palpitations.

## 2020-03-13 NOTE — ED Provider Notes (Signed)
[facility]   MRN: [id number] DOB: [birth year]  Subjective:   Tracy Carson is a 39 y.o. female presenting for 1 day hx of acute onset recurrent left sided chest pain. States that she tried ibuprofen without relief. Denies falls, trauma, fever, shob, coughing, n/v. Has had intermittent upper belly pain as well, feels like she sometimes has a knot in her stomach. She has had multiple visits for the same sx, multiple labs, cxr, ekg. All have been unremarkable.   No current facility-administered medications for this encounter.  Current Outpatient Medications:  .  famotidine (PEPCID) 20 MG tablet, Take 1 tablet (20 mg total) by mouth 2 (two) times daily. (Patient not taking: Reported on 03/05/2020), Disp: 60 tablet, Rfl: 0 .  hydrOXYzine (ATARAX/VISTARIL) 25 MG tablet, Take 1 tablet (25 mg total) by mouth 3 (three) times daily as needed. (Patient not taking: Reported on 03/05/2020), Disp: 60 tablet, Rfl: 1 .  levothyroxine (SYNTHROID) 100 MCG tablet, Take 1 tablet (100 mcg total) by mouth daily., Disp: 30 tablet, Rfl: 3 .  meloxicam (MOBIC) 7.5 MG tablet, Take 1 tablet (7.5 mg total) by mouth daily. (Patient not taking: Reported on 03/05/2020), Disp: 30 tablet, Rfl: 0 .  naproxen (NAPROSYN) 500 MG tablet, Take 1 tablet (500 mg total) by mouth 2 (two) times daily. (Patient not taking: Reported on 03/05/2020), Disp: 30 tablet, Rfl: 0 .  tiZANidine (ZANAFLEX) 4 MG tablet, Take 1 tablet (4 mg total) by mouth at bedtime. (Patient not taking: Reported on 03/05/2020), Disp: 30 tablet, Rfl: 0 .  topiramate (TOPAMAX) 25 MG tablet, Take 1 tablet (25 mg total) by mouth 2 (two) times daily. (Patient not taking: Reported on 03/05/2020), Disp: 180 tablet, Rfl: 0   No Known Allergies  Past Medical History:  Diagnosis Date  . Hypertension   . Thyroid disease    hyper  . 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 CESAREAN SECTION  07/02/2011   Procedure:  CESAREAN SECTION;  Surgeon: Roseanna Rainbow, MD;  Location: [facility];  Service: Gynecology;  Laterality: N/A;  . CHOLECYSTECTOMY  2002    Family History  Problem Relation Age of Onset  . Diabetes Mother   . Hypertension Father   . Thyroid disease Paternal Grandmother   . Thyroid disease Sister   . Thyroid disease Brother     Social History   Tobacco Use  . Smoking status: Current Some Day Smoker    Packs/day: 5.00    Years: 1.00    Pack years: 5.00    Types: Cigars  . Smokeless tobacco: Former Neurosurgeon  . Tobacco comment: 03/28/19 7-8/day  Substance Use Topics  . Alcohol use: No    Comment: quit 10/2018  . Drug use: No    ROS   Objective:   Vitals: BP 138/87 (BP Location: Right Arm)   Pulse 70   Resp 16   LMP 02/20/2020   SpO2 98%   Physical Exam Constitutional:      General: She is not in acute distress.    Appearance: Normal appearance. She is well-developed. She is not ill-appearing, toxic-appearing or diaphoretic.  HENT:     Head: Normocephalic and atraumatic.     Nose: Nose normal.     Mouth/Throat:     Mouth: Mucous membranes are moist.  Eyes:     General: No scleral icterus.       Right eye: No discharge.  Left eye: No discharge.     Extraocular Movements: Extraocular movements intact.     Conjunctiva/sclera: Conjunctivae normal.     Pupils: Pupils are equal, round, and reactive to light.  Cardiovascular:     Rate and Rhythm: Normal rate and regular rhythm.     Pulses: Normal pulses.     Heart sounds: Normal heart sounds. No murmur heard.  No friction rub. No gallop.   Pulmonary:     Effort: Pulmonary effort is normal. No respiratory distress.     Breath sounds: Normal breath sounds. No stridor. No wheezing, rhonchi or rales.  Chest:     Chest wall: No tenderness.  Abdominal:     General: Bowel sounds are normal. There is no distension.     Palpations: Abdomen is soft. There is no mass.     Tenderness: There is no abdominal tenderness.  There is no right CVA tenderness, left CVA tenderness, guarding or rebound.  Skin:    General: Skin is warm and dry.     Findings: No rash.  Neurological:     Mental Status: She is alert and oriented to person, place, and time.  Psychiatric:        Mood and Affect: Mood normal.        Behavior: Behavior normal.        Thought Content: Thought content normal.        Judgment: Judgment normal.     ED ECG REPORT   Date: 03/13/2020  Rate: 65bpm  Rhythm: normal sinus rhythm  QRS Axis: right  Intervals: normal  ST/T Wave abnormalities: normal  Conduction Disutrbances:none  Narrative Interpretation: Sinus rhythm at 65bpm, unchanged from last ekg.   Old EKG Reviewed: unchanged  I have personally reviewed the EKG tracing and agree with the computerized printout as noted.   Assessment and Plan :   PDMP not reviewed this encounter.  1. Left-sided chest pain   2. Generalized abdominal pain     Suspect musculoskeletal chest pain. Recommended naproxen, tizanidine. Start Pepcid to address atypical GERD. Patient needs to establish care with Spanish speaking PCP for follow up and further work up. May need referral to GI. Recommended she contact [facility] for a consult. Counseled patient on potential for adverse effects with medications prescribed/recommended today, ER and return-to-clinic precautions discussed, patient verbalized understanding.    Wallis Bamberg, PA-C 03/13/20 1432

## 2020-03-14 ENCOUNTER — Ambulatory Visit: Payer: Self-pay

## 2020-03-17 ENCOUNTER — Other Ambulatory Visit: Payer: Self-pay

## 2020-03-17 ENCOUNTER — Encounter (HOSPITAL_COMMUNITY): Payer: Self-pay

## 2020-03-17 ENCOUNTER — Ambulatory Visit (HOSPITAL_COMMUNITY)
Admission: EM | Admit: 2020-03-17 | Discharge: 2020-03-17 | Disposition: A | Discharge status: Home / Self Care | Payer: HRSA Program | Attending: Urgent Care | Admitting: Urgent Care

## 2020-03-17 DIAGNOSIS — Z791 Long term (current) use of non-steroidal anti-inflammatories (NSAID): Secondary | ICD-10-CM | POA: Diagnosis not present

## 2020-03-17 DIAGNOSIS — R509 Fever, unspecified: Secondary | ICD-10-CM

## 2020-03-17 DIAGNOSIS — Z8349 Family history of other endocrine, nutritional and metabolic diseases: Secondary | ICD-10-CM | POA: Diagnosis not present

## 2020-03-17 DIAGNOSIS — F17210 Nicotine dependence, cigarettes, uncomplicated: Secondary | ICD-10-CM | POA: Diagnosis not present

## 2020-03-17 DIAGNOSIS — I10 Essential (primary) hypertension: Secondary | ICD-10-CM | POA: Diagnosis not present

## 2020-03-17 DIAGNOSIS — G8929 Other chronic pain: Secondary | ICD-10-CM | POA: Insufficient documentation

## 2020-03-17 DIAGNOSIS — Z9049 Acquired absence of other specified parts of digestive tract: Secondary | ICD-10-CM | POA: Diagnosis not present

## 2020-03-17 DIAGNOSIS — E0590 Thyrotoxicosis, unspecified without thyrotoxic crisis or storm: Secondary | ICD-10-CM | POA: Insufficient documentation

## 2020-03-17 DIAGNOSIS — U071 COVID-19: Secondary | ICD-10-CM | POA: Diagnosis present

## 2020-03-17 DIAGNOSIS — Z8249 Family history of ischemic heart disease and other diseases of the circulatory system: Secondary | ICD-10-CM | POA: Insufficient documentation

## 2020-03-17 DIAGNOSIS — Z79899 Other long term (current) drug therapy: Secondary | ICD-10-CM | POA: Insufficient documentation

## 2020-03-17 DIAGNOSIS — R0789 Other chest pain: Secondary | ICD-10-CM | POA: Insufficient documentation

## 2020-03-17 DIAGNOSIS — B349 Viral infection, unspecified: Secondary | ICD-10-CM

## 2020-03-17 DIAGNOSIS — R0981 Nasal congestion: Secondary | ICD-10-CM

## 2020-03-17 DIAGNOSIS — R070 Pain in throat: Secondary | ICD-10-CM

## 2020-03-17 MED ORDER — CETIRIZINE HCL 10 MG PO TABS: 10.0000 mg | ORAL_TABLET | Freq: Every day | ORAL | 0 refills | Status: DC

## 2020-03-17 MED ORDER — PROMETHAZINE-DM 6.25-15 MG/5ML PO SYRP
5.0000 mL | ORAL_SOLUTION | Freq: Every evening | ORAL | 0 refills | Status: DC | PRN
Start: 2020-03-17 — End: 2020-11-13

## 2020-03-17 MED ORDER — PSEUDOEPHEDRINE HCL 30 MG PO TABS
30.0000 mg | ORAL_TABLET | Freq: Three times a day (TID) | ORAL | 0 refills | Status: DC | PRN
Start: 2020-03-17 — End: 2020-11-13

## 2020-03-17 MED ORDER — BENZONATATE 100 MG PO CAPS: 100.0000 mg | ORAL_CAPSULE | Freq: Three times a day (TID) | ORAL | 0 refills | Status: DC | PRN

## 2020-03-17 NOTE — ED Triage Notes (Signed)
Pt presents with complaints of headache and fever x 2 days. Pt has had tylenol or ibuprofen for the pain and fever.

## 2020-03-17 NOTE — Discharge Instructions (Addendum)
Para el dolor de garganta o tos puede usar un t de miel. Use 3 cucharaditas de miel con jugo exprimido de [employer]. Coloque trozos de Microbiologist en 1/2-1 taza de agua y caliente sobre la estufa. Luego mezcle los ingredientes y repita cada 4 horas. Para fiebre, dolores de cuerpo tome Tylenol 500mg -650mg  cada 6 horas. Hidrata muy bien con al menos 2 litros (64 onzas) de agua al dia. Coma comidas ligeras como sopas para y nutricion. Tambien puede tomar suero. Comience un antihistamnico como Zyrtec (cetirizina) 10mg  al dia. Puede usar pseudoefedrina (Sudafed) de venta libre para el goteo posnasal, congestin a una dosis de 30 mg cada 8 horas o cada 12 horas. Use el jarabe por la noche para su tos y las capsulas durante el dia.

## 2020-03-17 NOTE — ED Provider Notes (Signed)
[facility]   MRN: [id number] DOB: [birth year]  Subjective:   Tracy Carson is a 39 y.o. female presenting for 1 day history of acute onset fever, runny and stuffy nose, throat pain, occasional cough.  Patient has not been vaccinated for COVID-19.  No one in her family has.  Still has the atypical chronic chest pain.  Has been using naproxen for this.  That is unchanged.  Denies shortness of breath.  No current facility-administered medications for this encounter.  Current Outpatient Medications:  .  famotidine (PEPCID) 20 MG tablet, Take 1 tablet (20 mg total) by mouth 2 (two) times daily., Disp: 60 tablet, Rfl: 0 .  hydrOXYzine (ATARAX/VISTARIL) 25 MG tablet, Take 1 tablet (25 mg total) by mouth 3 (three) times daily as needed. (Patient not taking: Reported on 03/05/2020), Disp: 60 tablet, Rfl: 1 .  levothyroxine (SYNTHROID) 100 MCG tablet, Take 1 tablet (100 mcg total) by mouth daily., Disp: 30 tablet, Rfl: 3 .  meloxicam (MOBIC) 7.5 MG tablet, Take 1 tablet (7.5 mg total) by mouth daily. (Patient not taking: Reported on 03/05/2020), Disp: 30 tablet, Rfl: 0 .  naproxen (NAPROSYN) 500 MG tablet, Take 1 tablet (500 mg total) by mouth 2 (two) times daily with a meal., Disp: 30 tablet, Rfl: 0 .  tiZANidine (ZANAFLEX) 4 MG tablet, Take 1 tablet (4 mg total) by mouth at bedtime., Disp: 30 tablet, Rfl: 0 .  topiramate (TOPAMAX) 25 MG tablet, Take 1 tablet (25 mg total) by mouth 2 (two) times daily. (Patient not taking: Reported on 03/05/2020), Disp: 180 tablet, Rfl: 0   No Known Allergies  Past Medical History:  Diagnosis Date  . Hypertension   . Thyroid disease    hyper  . 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 CESAREAN SECTION  07/02/2011   Procedure: CESAREAN SECTION;  Surgeon: Roseanna Rainbow, MD;  Location: [facility];  Service: Gynecology;  Laterality: N/A;  . CHOLECYSTECTOMY  2002    Family History  Problem Relation Age  of Onset  . Diabetes Mother   . Hypertension Father   . Thyroid disease Paternal Grandmother   . Thyroid disease Sister   . Thyroid disease Brother     Social History   Tobacco Use  . Smoking status: Current Some Day Smoker    Packs/day: 5.00    Years: 1.00    Pack years: 5.00    Types: Cigars  . Smokeless tobacco: Former Neurosurgeon  . Tobacco comment: 03/28/19 7-8/day  Substance Use Topics  . Alcohol use: No    Comment: quit 10/2018  . Drug use: No    ROS   Objective:   Vitals: BP 130/70   Pulse 86   Temp 99.9 F (37.7 C)   Resp 18   LMP 02/20/2020   SpO2 97%   Physical Exam Constitutional:      General: She is not in acute distress.    Appearance: Normal appearance. She is well-developed. She is not ill-appearing, toxic-appearing or diaphoretic.  HENT:     Head: Normocephalic and atraumatic.     Nose: Congestion and rhinorrhea present.     Mouth/Throat:     Mouth: Mucous membranes are moist.     Pharynx: Oropharynx is clear. No oropharyngeal exudate or posterior oropharyngeal erythema.  Eyes:     General: No scleral icterus.       Right eye: No discharge.  Left eye: No discharge.     Extraocular Movements: Extraocular movements intact.     Conjunctiva/sclera: Conjunctivae normal.     Pupils: Pupils are equal, round, and reactive to light.  Cardiovascular:     Rate and Rhythm: Normal rate and regular rhythm.     Pulses: Normal pulses.     Heart sounds: Normal heart sounds. No murmur heard.  No friction rub. No gallop.   Pulmonary:     Effort: Pulmonary effort is normal. No respiratory distress.     Breath sounds: Normal breath sounds. No stridor. No wheezing, rhonchi or rales.  Skin:    General: Skin is warm and dry.     Findings: No rash.  Neurological:     Mental Status: She is alert and oriented to person, place, and time.  Psychiatric:        Mood and Affect: Mood normal.        Behavior: Behavior normal.        Thought Content: Thought content  normal.        Judgment: Judgment normal.      Assessment and Plan :   PDMP not reviewed this encounter.  1. Viral illness   2. Fever, unspecified   3. Sinus congestion   4. Throat discomfort     Will manage for viral illness such as viral URI, viral syndrome, viral rhinitis, COVID-19. Counseled patient on nature of COVID-19 including modes of transmission, diagnostic testing, management and supportive care.  Offered scripts for symptomatic relief. COVID 19 testing is pending. Counseled patient on potential for adverse effects with medications prescribed/recommended today, ER and return-to-clinic precautions discussed, patient verbalized understanding.     Wallis Bamberg, [place] 03/17/20 1955

## 2020-03-18 LAB — SARS CORONAVIRUS 2 (TAT 6-24 HRS): SARS Coronavirus 2: POSITIVE — AB

## 2020-04-28 ENCOUNTER — Other Ambulatory Visit: Payer: Self-pay

## 2020-04-28 ENCOUNTER — Other Ambulatory Visit (INDEPENDENT_AMBULATORY_CARE_PROVIDER_SITE_OTHER): Payer: Self-pay

## 2020-04-28 DIAGNOSIS — E890 Postprocedural hypothyroidism: Secondary | ICD-10-CM

## 2020-04-28 LAB — T4, FREE: Free T4: 0.28 ng/dL — ABNORMAL LOW (ref 0.60–1.60)

## 2020-04-28 LAB — TSH: TSH: 14.36 u[IU]/mL — ABNORMAL HIGH (ref 0.35–4.50)

## 2020-04-29 ENCOUNTER — Other Ambulatory Visit: Payer: Self-pay

## 2020-04-29 ENCOUNTER — Encounter (HOSPITAL_COMMUNITY): Payer: Self-pay

## 2020-04-29 ENCOUNTER — Emergency Department (HOSPITAL_COMMUNITY)
Admission: EM | Admit: 2020-04-29 | Discharge: 2020-04-29 | Disposition: A | Discharge status: Home / Self Care | Payer: Self-pay | Attending: Emergency Medicine | Admitting: Emergency Medicine

## 2020-04-29 DIAGNOSIS — Z79899 Other long term (current) drug therapy: Secondary | ICD-10-CM | POA: Insufficient documentation

## 2020-04-29 DIAGNOSIS — E039 Hypothyroidism, unspecified: Secondary | ICD-10-CM | POA: Insufficient documentation

## 2020-04-29 DIAGNOSIS — H5711 Ocular pain, right eye: Secondary | ICD-10-CM | POA: Insufficient documentation

## 2020-04-29 DIAGNOSIS — F17290 Nicotine dependence, other tobacco product, uncomplicated: Secondary | ICD-10-CM | POA: Insufficient documentation

## 2020-04-29 DIAGNOSIS — I10 Essential (primary) hypertension: Secondary | ICD-10-CM | POA: Insufficient documentation

## 2020-04-29 LAB — TSH: TSH: 11.98 u[IU]/mL — ABNORMAL HIGH (ref 0.350–4.500)

## 2020-04-29 MED ORDER — ACETAMINOPHEN 325 MG PO TABS
650.0000 mg | ORAL_TABLET | Freq: Once | ORAL | Status: AC
  Administered 2020-04-29: 650 mg via ORAL
  Filled 2020-04-29: qty 2

## 2020-04-29 MED ORDER — TETRACAINE HCL 0.5 % OP SOLN
2.0000 [drp] | Freq: Once | OPHTHALMIC | Status: AC
  Administered 2020-04-29: 2 [drp] via OPHTHALMIC
  Filled 2020-04-29: qty 4

## 2020-04-29 MED ORDER — FLUORESCEIN SODIUM 1 MG OP STRP
1.0000 | ORAL_STRIP | Freq: Once | OPHTHALMIC | Status: AC
  Administered 2020-04-29: 1 via OPHTHALMIC
  Filled 2020-04-29: qty 1

## 2020-04-29 NOTE — Discharge Instructions (Signed)
Your eye exam was reassuring today in the emergency department, I want you to follow-up with the ophthalmologist as soon as you can.  Please call their office and schedule appointment.  You can use Tylenol as directed on the bottle for pain.  If your symptoms become worse or if you have any new symptoms please come back to the emergency department.  Your thyroid levels are pending, your primary care doctor can follow-up with this.  Please see them in the next couple days as well. Use the attached instructions.

## 2020-04-29 NOTE — ED Triage Notes (Addendum)
Pt presents to ED with complaints of bilateral eye swelling x 4-5 days with blurred vision, bilateral temporal headache. Endorses small amount of drainage from both eyes. Also endorses pain along lymph nodes in neck, and muscle cramps.

## 2020-04-29 NOTE — ED Provider Notes (Signed)
[facility] EMERGENCY DEPARTMENT Provider Note   CSN: [id number] Arrival date & time: 04/29/20  1518     History Chief Complaint  Patient presents with  . Eye Pain    Candence Sease is a 39 y.o. female with pertinent history of hypertension and hypethyroidism presents emergency department today for bilateral eye swelling/painfor the past 5 days.  Patient is Spanish-speaking however did not want medical interpreter. Pt states that she frequently has eye swelling due to her thyroid disease which is managed by her PCP, however came in today because of eye pain associated with it.  Patient admits to eye pain on R side along with diplopia on R side, some diplopia on L side. No photophobia, seeing spots, or seeing halos. Also admits to swelling and pain around her left  And righteye.  No numbness or tingling around face.  No fevers, chills, nausea, vomiting.  Patient states that she has headache bilaterally, states that she has had this for multiple years. HA today is  no different than before.  Also admits to lymph node in her neck on the R side for the past couple of days.  No vision loss.  Triage note states that she endorses small drainage from both eyes, patient tells me that she does not have any discharge, however eyes are sometimes watery.. Has not been taking anything for this.  Does not wear contacts. Did not scratch eye. States that eye pain is mild and is behind her eye on the right side. Does not have opthomologist. No eye itching or eye redness. No other URI symptoms.   HPI     Past Medical History:  Diagnosis Date  . Hypertension   . Thyroid disease    hyper  . Tuberculosis    as a child    Patient Active Problem List   Diagnosis Date Noted  . 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 History of blurry vision 05/20/2014  . History of itching of eye 05/20/2014  . Other fatigue 05/20/2014  . Tobacco dependence 05/20/2014  . Palpitations  05/20/2014  . Abdominal pain 11/03/2012  . 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 Tachycardia 11/03/2012  . Hematochezia 11/03/2012  . Graves disease 11/03/2012  . Anemia 11/03/2012  . Breech presentation 07/02/2011  . Cesarean delivery, without mention of indication, delivered, with or without mention of antepartum condition 07/02/2011    Past Surgical History:  Procedure Laterality Date  . CESAREAN SECTION  07/02/2011   Procedure: CESAREAN SECTION;  Surgeon: Roseanna Rainbow, MD;  Location: [facility];  Service: Gynecology;  Laterality: N/A;  . CHOLECYSTECTOMY  2002     OB History    Gravida  4   Para  4   Term  4   Preterm      AB      Living  4     SAB      TAB      Ectopic      Multiple      Live Births  3           Family History  Problem Relation Age of Onset  . Diabetes Mother   . Hypertension Father   . Thyroid disease Paternal Grandmother   . Thyroid disease Sister   . Thyroid disease Brother     Social History   Tobacco Use  . Smoking status: Current Some Day Smoker    Packs/day: 5.00    Years: 1.00    Pack years: 5.00  Types: Cigars  . Smokeless tobacco: Former NeurosurgeonUser  . Tobacco comment: 03/28/19 7-8/day  Substance Use Topics  . Alcohol use: No    Comment: quit 10/2018  . Drug use: No    Home Medications Prior to Admission medications   Medication Sig Start Date End Date Taking? Authorizing Provider  benzonatate (TESSALON) 100 MG capsule Take 1-2 capsules (100-200 mg total) by mouth 3 (three) times daily as needed. 03/17/20   Wallis BambergMani, Mario, PA-C  cetirizine (ZYRTEC ALLERGY) 10 MG tablet Take 1 tablet (10 mg total) by mouth daily. 03/17/20   Wallis BambergMani, Mario, PA-C  famotidine (PEPCID) 20 MG tablet Take 1 tablet (20 mg total) by mouth 2 (two) times daily. 03/13/20   Wallis BambergMani, Mario, PA-C  hydrOXYzine (ATARAX/VISTARIL) 25 MG tablet Take 1 tablet (25 mg total) by mouth 3 (three) times daily as needed. Patient not taking: Reported on 03/05/2020 08/01/19   Hoy RegisterNewlin,  Enobong, MD  levothyroxine (SYNTHROID) 100 MCG tablet Take 1 tablet (100 mcg total) by mouth daily. 03/05/20   Reather LittlerKumar, Ajay, MD  meloxicam (MOBIC) 7.5 MG tablet Take 1 tablet (7.5 mg total) by mouth daily. Patient not taking: Reported on 03/05/2020 12/11/19   Wallis BambergMani, Mario, PA-C  naproxen (NAPROSYN) 500 MG tablet Take 1 tablet (500 mg total) by mouth 2 (two) times daily with a meal. 03/13/20   Wallis BambergMani, Mario, PA-C  promethazine-dextromethorphan (PROMETHAZINE-DM) 6.25-15 MG/5ML syrup Take 5 mLs by mouth at bedtime as needed for cough. 03/17/20   Wallis BambergMani, Mario, PA-C  pseudoephedrine (SUDAFED) 30 MG tablet Take 1 tablet (30 mg total) by mouth every 8 (eight) hours as needed for congestion. 03/17/20   Wallis BambergMani, Mario, PA-C  tiZANidine (ZANAFLEX) 4 MG tablet Take 1 tablet (4 mg total) by mouth at bedtime. 03/13/20   Wallis BambergMani, Mario, PA-C  topiramate (TOPAMAX) 25 MG tablet Take 1 tablet (25 mg total) by mouth 2 (two) times daily. Patient not taking: Reported on 03/05/2020 05/24/19   Shawnie DapperLomax, Amy, NP    Allergies    Patient has no known allergies.  Review of Systems   Review of Systems  Constitutional: Negative for diaphoresis, fatigue and fever.  HENT: Negative for congestion, drooling, ear discharge, ear pain, postnasal drip, sinus pressure, sneezing, sore throat and tinnitus.   Eyes: Positive for pain and visual disturbance. Negative for photophobia, discharge, redness and itching.  Respiratory: Negative for shortness of breath.   Cardiovascular: Negative for chest pain.  Gastrointestinal: Negative for nausea and vomiting.  Musculoskeletal: Negative for arthralgias, back pain and myalgias.  Skin: Negative for color change, pallor, rash and wound.  Neurological: Negative for syncope, weakness, light-headedness, numbness and headaches.  Psychiatric/Behavioral: Negative for behavioral problems and confusion.    Physical Exam Updated Vital Signs BP 125/82 (BP Location: Right Arm)   Pulse 75   Temp 98.9 F (37.2 C)  (Oral)   Resp 15   SpO2 96%   Physical Exam Constitutional:      General: She is not in acute distress.    Appearance: Normal appearance. She is not ill-appearing, toxic-appearing or diaphoretic.     Comments: Pt appears well, no acute distress.   HENT:     Head: Normocephalic and atraumatic.     Mouth/Throat:     Mouth: Mucous membranes are moist.     Pharynx: Oropharynx is clear.  Eyes:     General: Lids are normal. Lids are everted, no foreign bodies appreciated. Vision grossly intact. Gaze aligned appropriately. No allergic shiner, visual field deficit or scleral icterus.  Right eye: No foreign body or discharge.        Left eye: No foreign body or discharge.     Intraocular pressure: Right eye pressure is 12 mmHg. Left eye pressure is 12 mmHg. Measurements were taken using a handheld tonometer.    Extraocular Movements: Extraocular movements intact.     Right eye: Normal extraocular motion and no nystagmus.     Left eye: Normal extraocular motion and no nystagmus.     Conjunctiva/sclera: Conjunctivae normal.     Right eye: Right conjunctiva is not injected.     Pupils: Pupils are equal, round, and reactive to light.     Comments: Eyes are not injected, no chemosis, no discharge. Bilateral eyes with some mild edema around eyes.  Everted lids, no foreign objects viewed.  Fluorescein did not reveal any uptake, no corneal abrasions.  Slit-lamp performed without any abnormality. Mild pain when pt looks to right on R eye otherwise normal EOMS. No erythema or tenderness around eyes.   Cardiovascular:     Rate and Rhythm: Normal rate and regular rhythm.     Pulses: Normal pulses.     Heart sounds: Normal heart sounds.  Pulmonary:     Effort: Pulmonary effort is normal. No respiratory distress.     Breath sounds: Normal breath sounds. No stridor. No wheezing, rhonchi or rales.  Chest:     Chest wall: No tenderness.  Abdominal:     General: Abdomen is flat. There is no distension.      Palpations: Abdomen is soft.     Tenderness: There is no abdominal tenderness. There is no guarding or rebound.  Musculoskeletal:        General: No swelling or tenderness. Normal range of motion.     Cervical back: Normal range of motion and neck supple. No rigidity.     Right lower leg: No edema.     Left lower leg: No edema.  Lymphadenopathy:     Cervical: Cervical adenopathy (R side) present.  Skin:    General: Skin is warm and dry.     Capillary Refill: Capillary refill takes less than 2 seconds.     Coloration: Skin is not pale.  Neurological:     General: No focal deficit present.     Mental Status: She is alert and oriented to person, place, and time.  Psychiatric:        Mood and Affect: Mood normal.        Behavior: Behavior normal.     ED Results / Procedures / Treatments   Labs (all labs ordered are listed, but only abnormal results are displayed) Labs Reviewed  TSH - Abnormal; Notable for the following components:      Result Value   TSH 11.980 (*)    All other components within normal limits    EKG None  Radiology No results found.  Procedures Procedures (including critical care time)  Medications Ordered in ED Medications  tetracaine (PONTOCAINE) 0.5 % ophthalmic solution 2 drop (2 drops Both Eyes Given 04/29/20 1840)  fluorescein ophthalmic strip 1 strip (1 strip Both Eyes Given 04/29/20 1840)  acetaminophen (TYLENOL) tablet 650 mg (650 mg Oral Given 04/29/20 1933)    ED Course  I have reviewed the triage vital signs and the nursing notes.  Pertinent labs & imaging results that were available during my care of the patient were reviewed by me and considered in my medical decision making (see chart for details).    MDM  Rules/Calculators/A&P                          Nidia Grogan is a 40 y.o. female with pertinent history of hypertension and hypethyroidism presents emergency department today for bilateral eye swelling/painfor the past 5  days.  Patient appears well.  No erythema or injection of eye.  Visual acuity normal, no concerns for preseptal or orbital cellulitis.  Everted eyelids, no foreign bodies.  Fluorescein did not reveal any uptake.  Normal slit exam.  Normal EOMs.  Patient appears well, normal pressures in the eye bilaterally.  No concerns for stroke since diplopia is bilateral and no other signs of stroke. I think patient most likely has a viral syndrome causing lymph nodes on the right side with right eye pain, however will have Dr. Rubin Payor assess as well.  Dr. Rubin Payor is not concerned of any acute process, will have patient follow-up with ophthalmology.  Will also obtain TSH, patient to follow-up with PCP. Symptomatic treatment discussed with patient. No eye pain red flags, pt to be discharged.  Doubt need for further emergent work up at this time. I explained the diagnosis and have given explicit precautions to return to the ER including for any other new or worsening symptoms. The patient understands and accepts the medical plan as it's been dictated and I have answered their questions. Discharge instructions concerning home care and prescriptions have been given. The patient is STABLE and is discharged to home in good condition.  I discussed this case with my attending physician who cosigned this note including patient's presenting symptoms, physical exam, and planned diagnostics and interventions. Attending physician stated agreement with plan or made changes to plan which were implemented.   Attending physician assessed patient at bedside.  Final Clinical Impression(s) / ED Diagnoses Final diagnoses:  Eye pain, right    Rx / DC Orders ED Discharge Orders    None       Farrel Gordon, PA-C 04/30/20 1430    Benjiman Core, MD 04/30/20 2230

## 2020-04-30 NOTE — ED Provider Notes (Signed)
Contacted pt about elevated TSH, she is aware and has PCP apt tomorrow for this.    Farrel Gordon, PA-C 04/30/20 1435    Benjiman Core, MD 04/30/20 2230

## 2020-05-01 ENCOUNTER — Other Ambulatory Visit: Payer: Self-pay

## 2020-05-01 ENCOUNTER — Other Ambulatory Visit: Payer: Self-pay | Admitting: Endocrinology

## 2020-05-01 ENCOUNTER — Encounter: Payer: Self-pay | Admitting: Endocrinology

## 2020-05-01 ENCOUNTER — Ambulatory Visit (INDEPENDENT_AMBULATORY_CARE_PROVIDER_SITE_OTHER): Payer: Self-pay | Admitting: Endocrinology

## 2020-05-01 VITALS — BP 118/82 | HR 62 | Resp 99 | Ht 62.5 in | Wt 175.0 lb

## 2020-05-01 DIAGNOSIS — E890 Postprocedural hypothyroidism: Secondary | ICD-10-CM

## 2020-05-01 MED ORDER — LEVOTHYROXINE SODIUM 125 MCG PO TABS: 125.0000 ug | ORAL_TABLET | Freq: Every day | ORAL | 3 refills | Status: DC

## 2020-05-01 NOTE — Progress Notes (Signed)
Patient ID: Tracy Carson, female   DOB: 01/02/1981, 39 y.o.   MRN: [id number]                                                                                                               Reason for Appointment:  Hyperthyroidism, follow-up visit    Chief complaint: Follow-up of thyroid   History of Present Illness:   Prior history: Her hyperthyroidism was diagnosed in 2012 before a pregnancy At that time she had symptoms of palpitations, shakiness, feeling excessively warm, difficulty sleeping, increased appetite and hair loss She was treated with methimazole She was recommended I-131 treatment in 2014 but even though she had an uptake test done she did not go for the treatment Subsequently has been very irregular with her follow-up program and medications She says she was taking her methimazole 2 tablets twice a day every third day or so because of cost Because of worsening symptoms of palpitations, feeling excessively hot and losing weight she presented to the emergency room on 11/08/16 and was admitted for 2 days  On her initial consultation in her free T4 was still very high at 3.04 and she was having problems with fatigue, weakness and heat intolerance along with tachycardia. Her methimazole was increased to 3 tablets twice a day initially   She finally agreed to do that I-131 treatment and this was done with 19.5 mCi on 02/16/17  RECENT history  In October 2018 she had normal thyroid functions and had no complaints but did not follow-up as directed subsequently In June 2020 at the ER she thought she had swollen glands in her neck along with anxiety and palpitations At that time her thyroid levels showed hyperthyroidism and free T4 was significantly high at 2.3 She was started on methimazole 10 mg twice daily by her PCP on 01/11/2019  Because of persistent hyperthyroidism she has been treated with 12 mCi of I-131 on 01/17/2020  She became hypothyroid in 8/21 with significantly  low free T4 However at that time she was asymptomatic She has been on levothyroxine 100 mcg daily since then  In August because of her having Covid infection she left off her levothyroxine for about 2 weeks However since then she has been usually taking it every morning before breakfast  Recently she has been feeling more tired No heat or cold intolerance She is having some swelling of her eyes especially in the morning In the last few days she was also having some blurred and double vision and went to the ER yesterday  Her free T4 level is significantly lower at 0.28 TSH still relatively high at 12    Wt Readings from Last 3 Encounters:  05/01/20 175 lb (79.4 kg)  03/06/20 175 lb (79.4 kg)  03/05/20 174 lb 12.8 oz (79.3 kg)    Thyroid function tests as follows:     Lab Results  Component Value Date   FREET4 0.28 (L) 04/28/2020   FREET4 0.41 (L) 03/03/2020   FREET4 0.76 01/30/2020   T3FREE  3.6 10/15/2019   T3FREE 3.2 05/24/2019   T3FREE 4.3 (H) 04/10/2019   TSH 11.980 (H) 04/29/2020   TSH 14.36 (H) 04/28/2020   TSH 3.28 03/03/2020    No results found for: THYROTRECAB   Allergies as of 05/01/2020   No Known Allergies     Medication List       Accurate as of May 01, 2020  8:51 PM. If you have any questions, ask your nurse or doctor.        benzonatate 100 MG capsule Commonly known as: TESSALON Take 1-2 capsules (100-200 mg total) by mouth 3 (three) times daily as needed.   cetirizine 10 MG tablet Commonly known as: ZyrTEC Allergy Take 1 tablet (10 mg total) by mouth daily.   famotidine 20 MG tablet Commonly known as: PEPCID Take 1 tablet (20 mg total) by mouth 2 (two) times daily.   hydrOXYzine 25 MG tablet Commonly known as: ATARAX/VISTARIL Take 1 tablet (25 mg total) by mouth 3 (three) times daily as needed.   levothyroxine 125 MCG tablet Commonly known as: SYNTHROID Take 1 tablet (125 mcg total) by mouth daily. What changed:   medication  strength  how much to take Changed by: Reather Littler, MD   meloxicam 7.5 MG tablet Commonly known as: Mobic Take 1 tablet (7.5 mg total) by mouth daily.   naproxen 500 MG tablet Commonly known as: NAPROSYN Take 1 tablet (500 mg total) by mouth 2 (two) times daily with a meal.   promethazine-dextromethorphan 6.25-15 MG/5ML syrup Commonly known as: PROMETHAZINE-DM Take 5 mLs by mouth at bedtime as needed for cough.   pseudoephedrine 30 MG tablet Commonly known as: SUDAFED Take 1 tablet (30 mg total) by mouth every 8 (eight) hours as needed for congestion.   tiZANidine 4 MG tablet Commonly known as: Zanaflex Take 1 tablet (4 mg total) by mouth at bedtime.   topiramate 25 MG tablet Commonly known as: TOPAMAX Take 1 tablet (25 mg total) by mouth 2 (two) times daily.           Past Medical History:  Diagnosis Date  . Hypertension   . Thyroid disease    hyper  . Tuberculosis    as a child    Past Surgical History:  Procedure Laterality Date  . CESAREAN SECTION  07/02/2011   Procedure: CESAREAN SECTION;  Surgeon: Roseanna Rainbow, MD;  Location: [facility];  Service: Gynecology;  Laterality: N/A;  . CHOLECYSTECTOMY  2002    Family History  Problem Relation Age of Onset  . Diabetes Mother   . Hypertension Father   . Thyroid disease Paternal Grandmother   . Thyroid disease Sister   . Thyroid disease Brother     Social History:  reports that she has been smoking cigars. She has a 5.00 pack-year smoking history. She has quit using smokeless tobacco. She reports that she does not drink alcohol and does not use drugs.  Allergies: No Known Allergies   Review of Systems   Having regular menstrual cycles     Examination:   BP 118/82   Pulse 62   Resp (!) 99   Ht 5' 2.5" (1.588 m)   Wt 175 lb (79.4 kg)   BMI 31.50 kg/m   She has mild proptosis especially on the right side Mild swelling of the eyelids present Ocular movements are normal No chemosis Biceps  reflexes appear normal    Assessment/Plan:   Graves' disease, treated with radioactive iodine in July 2018 and again  in 12/2019  Even though she has been on levothyroxine consistently for the last month she is still hypothyroid and free T4 is unusually low She will increase her dose to 125 mcg Regardless of other medication she needs to take this consistently every morning before breakfast  For her Graves' ophthalmopathy since she is having less diplopia now she will be monitored periodically She will have follow-up in about 6 weeks However if she has any worsening of her prominence of the eyes, diplopia or worsening vision may consider steroids  There are no Patient Instructions on file for this visit.   Reather Littler 05/01/2020, 8:51 PM    Note: This office note was prepared with Dragon voice recognition system technology. Any transcriptional errors that result from this process are unintentional.

## 2020-06-04 ENCOUNTER — Ambulatory Visit: Payer: Self-pay | Attending: Family Medicine | Admitting: Family Medicine

## 2020-06-04 ENCOUNTER — Encounter: Payer: Self-pay | Admitting: Family Medicine

## 2020-06-04 ENCOUNTER — Other Ambulatory Visit: Payer: Self-pay | Admitting: Family Medicine

## 2020-06-04 VITALS — BP 126/66 | HR 63 | Temp 98.4°F | Ht 62.5 in | Wt 175.2 lb

## 2020-06-04 DIAGNOSIS — E890 Postprocedural hypothyroidism: Secondary | ICD-10-CM

## 2020-06-04 DIAGNOSIS — R252 Cramp and spasm: Secondary | ICD-10-CM

## 2020-06-04 DIAGNOSIS — Z23 Encounter for immunization: Secondary | ICD-10-CM

## 2020-06-04 DIAGNOSIS — F17200 Nicotine dependence, unspecified, uncomplicated: Secondary | ICD-10-CM

## 2020-06-04 DIAGNOSIS — G44209 Tension-type headache, unspecified, not intractable: Secondary | ICD-10-CM

## 2020-06-04 MED ORDER — TIZANIDINE HCL 4 MG PO TABS: 4.0000 mg | ORAL_TABLET | Freq: Three times a day (TID) | ORAL | 2 refills | Status: DC | PRN

## 2020-06-04 MED ORDER — NICOTINE 7 MG/24HR TD PT24: 7.0000 mg | MEDICATED_PATCH | Freq: Every day | TRANSDERMAL | 2 refills | Status: DC

## 2020-06-04 NOTE — Progress Notes (Signed)
Subjective:  Patient ID: Tracy Carson, female    DOB: 02-Aug-1980  Age: 39 y.o. MRN: [id number]  CC: Hypothyroidism   HPI Tracy Carson  Is a 39 year old female with a history of treatment induced hypothyroidism (radioactive iodine treatment of Graves' disease 01/2017), migraines, anxiety, and depression, history of COVID in 02/2020 who presents today for a follow up visit.  She is followed by endocrinology for treatment of her hyperthyroidism  Complains of daily cramps which are generalized, described as intense but lasting for short periods.They previously occurred twice/week. They occur in her legs, arms, abdomen, neck . Noticed that symptoms occur when she makes a fast movement especially when doing chores. She can no longer wear high heels as this precipitates the cramps. Headaches are managed by neurology and she is doing well on Topamax. Hypothyroidism is managed by endocrine and she is on levothyroxine She smokes 1 pack of cig over 4 days.  Past Medical History:  Diagnosis Date  . Hypertension   . Thyroid disease    hyper  . Tuberculosis    as a child    Past Surgical History:  Procedure Laterality Date  . CESAREAN SECTION  07/02/2011   Procedure: CESAREAN SECTION;  Surgeon: Roseanna Rainbow, MD;  Location: [facility];  Service: Gynecology;  Laterality: N/A;  . CHOLECYSTECTOMY  2002    Family History  Problem Relation Age of Onset  . Diabetes Mother   . Hypertension Father   . Thyroid disease Paternal Grandmother   . Thyroid disease Sister   . Thyroid disease Brother     No Known Allergies  Outpatient Medications Prior to Visit  Medication Sig Dispense Refill  . cetirizine (ZYRTEC ALLERGY) 10 MG tablet Take 1 tablet (10 mg total) by mouth daily. 30 tablet 0  . famotidine (PEPCID) 20 MG tablet Take 1 tablet (20 mg total) by mouth 2 (two) times daily. 60 tablet 0  . hydrOXYzine (ATARAX/VISTARIL) 25 MG tablet Take 1 tablet (25 mg total) by mouth 3 (three)  times daily as needed. 60 tablet 1  . levothyroxine (SYNTHROID) 125 MCG tablet Take 1 tablet (125 mcg total) by mouth daily. 30 tablet 3  . meloxicam (MOBIC) 7.5 MG tablet Take 1 tablet (7.5 mg total) by mouth daily. 30 tablet 0  . naproxen (NAPROSYN) 500 MG tablet Take 1 tablet (500 mg total) by mouth 2 (two) times daily with a meal. 30 tablet 0  . pseudoephedrine (SUDAFED) 30 MG tablet Take 1 tablet (30 mg total) by mouth every 8 (eight) hours as needed for congestion. 30 tablet 0  . topiramate (TOPAMAX) 25 MG tablet Take 1 tablet (25 mg total) by mouth 2 (two) times daily. 180 tablet 0  . tiZANidine (ZANAFLEX) 4 MG tablet Take 1 tablet (4 mg total) by mouth at bedtime. 30 tablet 0  . promethazine-dextromethorphan (PROMETHAZINE-DM) 6.25-15 MG/5ML syrup Take 5 mLs by mouth at bedtime as needed for cough. (Patient not taking: Reported on 06/04/2020) 100 mL 0  . benzonatate (TESSALON) 100 MG capsule Take 1-2 capsules (100-200 mg total) by mouth 3 (three) times daily as needed. (Patient not taking: Reported on 06/04/2020) 60 capsule 0   No facility-administered medications prior to visit.     ROS Review of Systems  Constitutional: Negative for activity change, appetite change and fatigue.  HENT: Negative for congestion, sinus pressure and sore throat.   Eyes: Negative for visual disturbance.  Respiratory: Negative for cough, chest tightness, shortness of breath and wheezing.  Cardiovascular: Negative for chest pain and palpitations.  Gastrointestinal: Negative for abdominal distention, abdominal pain and constipation.  Endocrine: Negative for polydipsia.  Genitourinary: Negative for dysuria and frequency.  Musculoskeletal: Negative for arthralgias and back pain.  Skin: Negative for rash.  Neurological: Negative for tremors, light-headedness and numbness.  Hematological: Does not bruise/bleed easily.  Psychiatric/Behavioral: Negative for agitation and behavioral problems.    Objective:    BP 126/66   Pulse 63   Temp 98.4 F (36.9 C) (Oral)   Ht 5' 2.5" (1.588 m)   Wt 175 lb 3.2 oz (79.5 kg)   SpO2 100%   BMI 31.53 kg/m   BP/Weight 06/04/2020 05/01/2020 04/29/2020  Systolic BP 126 118 125  Diastolic BP 66 82 82  Wt. (Lbs) 175.2 175 -  BMI 31.53 31.5 -      Physical Exam Constitutional:      Appearance: She is well-developed.  Neck:     Vascular: No JVD.  Cardiovascular:     Rate and Rhythm: Normal rate.     Heart sounds: Normal heart sounds. No murmur heard.   Pulmonary:     Effort: Pulmonary effort is normal.     Breath sounds: Normal breath sounds. No wheezing or rales.  Chest:     Chest wall: No tenderness.  Abdominal:     General: Bowel sounds are normal. There is no distension.     Palpations: Abdomen is soft. There is no mass.     Tenderness: There is no abdominal tenderness.  Musculoskeletal:        General: Normal range of motion.     Right lower leg: No edema.     Left lower leg: No edema.  Neurological:     Mental Status: She is alert and oriented to person, place, and time.  Psychiatric:        Mood and Affect: Mood normal.     CMP Latest Ref Rng & Units 02/13/2020 07/28/2019 05/28/2019  Glucose 70 - 99 mg/dL 409(W) 119(J) 478(G)  BUN 6 - 20 mg/dL 8 6 5(L)  Creatinine 9.56 - 1.00 mg/dL 2.13 0.86 5.78  Sodium 135 - 145 mmol/L 136 137 136  Potassium 3.5 - 5.1 mmol/L 3.6 3.8 3.9  Chloride 98 - 111 mmol/L 109 106 103  CO2 22 - 32 mmol/L 18(L) 23 20(L)  Calcium 8.9 - 10.3 mg/dL 4.6(N) 9.2 9.2  Total Protein 6.5 - 8.1 g/dL - - 7.3  Total Bilirubin 0.3 - 1.2 mg/dL - - 0.5  Alkaline Phos 38 - 126 U/L - - 107  AST 15 - 41 U/L - - 21  ALT 0 - 44 U/L - - 25    Lipid Panel     Component Value Date/Time   CHOL 112 11/15/2016 1038   TRIG 162 (H) 11/15/2016 1038   HDL 24 (L) 11/15/2016 1038   CHOLHDL 4.7 (H) 11/15/2016 1038   LDLCALC 56 11/15/2016 1038    CBC    Component Value Date/Time   WBC 10.4 02/13/2020 1707   RBC 5.10  02/13/2020 1707   HGB 15.8 (H) 02/13/2020 1707   HGB 13.3 11/15/2016 1038   HCT 44.8 02/13/2020 1707   HCT 38.1 11/15/2016 1038   PLT 214 02/13/2020 1707   PLT 185 11/15/2016 1038   MCV 87.8 02/13/2020 1707   MCV 81 11/15/2016 1038   MCH 31.0 02/13/2020 1707   MCHC 35.3 02/13/2020 1707   RDW 12.0 02/13/2020 1707   RDW 13.5 11/15/2016 1038  LYMPHSABS 1.7 05/28/2019 1332   LYMPHSABS 2.7 11/15/2016 1038   MONOABS 0.3 05/28/2019 1332   EOSABS 0.2 05/28/2019 1332   EOSABS 0.2 11/15/2016 1038   BASOSABS 0.0 05/28/2019 1332   BASOSABS 0.0 11/15/2016 1038    Lab Results  Component Value Date   HGBA1C 5.2 11/15/2016    Assessment & Plan:  1. Muscle cramp Unknown etiology We will check electrolytes Advised to change positions slowly, increase oral hydration - tiZANidine (ZANAFLEX) 4 MG tablet; Take 1 tablet (4 mg total) by mouth every 8 (eight) hours as needed for muscle spasms.  Dispense: 60 tablet; Refill: 2 - Basic Metabolic Panel - CK  2. Tobacco dependence Spent 3 minutes counseling on smoking cessation and she is willing to work on quitting. Discussed available pharmacotherapeutic options and she has chosen the patches. - nicotine (NICODERM CQ) 7 mg/24hr patch; Place 1 patch (7 mg total) onto the skin daily.  Dispense: 28 patch; Refill: 2  3. Hypothyroidism, postradioiodine therapy Currently on levothyroxine Managed by endocrine  4. Muscle tension headache Doing well on Topamax    Meds ordered this encounter  Medications  . tiZANidine (ZANAFLEX) 4 MG tablet    Sig: Take 1 tablet (4 mg total) by mouth every 8 (eight) hours as needed for muscle spasms.    Dispense:  60 tablet    Refill:  2  . nicotine (NICODERM CQ) 7 mg/24hr patch    Sig: Place 1 patch (7 mg total) onto the skin daily.    Dispense:  28 patch    Refill:  2    Follow-up: Return in about 6 months (around 12/02/2020) for Chronic disease management.       [name], MD, FAAFP. [facility] [place], [place] [phone]   06/04/2020, 10:31 AM

## 2020-06-04 NOTE — Progress Notes (Signed)
Having cramping in legs, neck, arms and abdomen.

## 2020-06-04 NOTE — Patient Instructions (Signed)
Muscle Cramps and Spasms Muscle cramps and spasms are when muscles tighten by themselves. They usually get better within minutes. Muscle cramps are painful. They are usually stronger and last longer than muscle spasms. Muscle spasms may or may not be painful. They can last a few seconds or much longer. Cramps and spasms can affect any muscle, but they occur most often in the calf muscles of the leg. They are usually not caused by a serious problem. In many cases, the cause is not known. Some common causes include:  Doing more physical work or exercise than your body is ready for.  Using the muscles too much (overuse) by repeating certain movements too many times.  Staying in a certain position for a long time.  Playing a sport or doing an activity without preparing properly.  Using bad form or technique while playing a sport or doing an activity.  Not having enough water in your body (dehydration).  Injury.  Side effects of some medicines.  Low levels of the salts and minerals in your blood (electrolytes), such as low potassium or calcium. Follow these instructions at home: Managing pain and stiffness      Massage, stretch, and relax the muscle. Do this for many minutes at a time.  If told, put heat on tight or tense muscles as often as told by your doctor. Use the heat source that your doctor recommends, such as a moist heat pack or a heating pad. ? Place a towel between your skin and the heat source. ? Leave the heat on for 20-30 minutes. ? Remove the heat if your skin turns bright red. This is very important if you are not able to feel pain, heat, or cold. You may have a greater risk of getting burned.  If told, put ice on the affected area. This may help if you are sore or have pain after a cramp or spasm. ? Put ice in a plastic bag. ? Place a towel between your skin and the bag. ? Leave the ice on for 20 minutes, 2-3 times a day.  Try taking hot showers or baths to help  relax tight muscles. Eating and drinking  Drink enough fluid to keep your pee (urine) pale yellow.  Eat a healthy diet to help ensure that your muscles work well. This should include: ? Fruits and vegetables. ? Lean protein. ? Whole grains. ? Low-fat or nonfat dairy products. General instructions  If you are having cramps often, avoid intense exercise for several days.  Take over-the-counter and prescription medicines only as told by your doctor.  Watch for any changes in your symptoms.  Keep all follow-up visits as told by your doctor. This is important. Contact a doctor if:  Your cramps or spasms get worse or happen more often.  Your cramps or spasms do not get better with time. Summary  Muscle cramps and spasms are when muscles tighten by themselves. They usually get better within minutes.  Cramps and spasms occur most often in the calf muscles of the leg.  Massage, stretch, and relax the muscle. This may help the cramp or spasm go away.  Drink enough fluid to keep your pee (urine) pale yellow. This information is not intended to replace advice given to you by your health care provider. Make sure you discuss any questions you have with your health care provider. Document Revised: 12/05/2017 Document Reviewed: 12/05/2017 Elsevier Patient Education  2020 [employer]  

## 2020-06-05 LAB — BASIC METABOLIC PANEL
BUN/Creatinine Ratio: 9 (ref 9–23)
BUN: 7 mg/dL (ref 6–20)
CO2: 21 mmol/L (ref 20–29)
Calcium: 9.1 mg/dL (ref 8.7–10.2)
Chloride: 103 mmol/L (ref 96–106)
Creatinine, Ser: 0.8 mg/dL (ref 0.57–1.00)
GFR calc Af Amer: 107 mL/min/{1.73_m2} (ref >59)
GFR calc non Af Amer: 93 mL/min/{1.73_m2} (ref >59)
Glucose: 88 mg/dL (ref 65–99)
Potassium: 4.3 mmol/L (ref 3.5–5.2)
Sodium: 138 mmol/L (ref 134–144)

## 2020-06-05 LAB — CK: Total CK: 149 U/L (ref 32–182)

## 2020-06-06 ENCOUNTER — Telehealth: Payer: Self-pay

## 2020-06-06 NOTE — Telephone Encounter (Signed)
Patient has viewed the results on her mychart.

## 2020-06-06 NOTE — Telephone Encounter (Signed)
-----   Message from [name], MD sent at 06/05/2020  8:57 AM EST ----- Please inform the patient that labs are normal. Thank you.

## 2020-06-10 ENCOUNTER — Other Ambulatory Visit: Payer: Self-pay

## 2020-06-10 ENCOUNTER — Other Ambulatory Visit (INDEPENDENT_AMBULATORY_CARE_PROVIDER_SITE_OTHER): Payer: Self-pay

## 2020-06-10 DIAGNOSIS — E890 Postprocedural hypothyroidism: Secondary | ICD-10-CM

## 2020-06-10 LAB — TSH: TSH: 2.99 u[IU]/mL (ref 0.35–4.50)

## 2020-06-10 LAB — T4, FREE: Free T4: 0.96 ng/dL (ref 0.60–1.60)

## 2020-06-12 ENCOUNTER — Other Ambulatory Visit: Payer: Self-pay

## 2020-06-12 ENCOUNTER — Encounter: Payer: Self-pay | Admitting: Endocrinology

## 2020-06-12 ENCOUNTER — Ambulatory Visit (INDEPENDENT_AMBULATORY_CARE_PROVIDER_SITE_OTHER): Payer: Self-pay | Admitting: Endocrinology

## 2020-06-12 VITALS — BP 108/68 | HR 67 | Ht 63.0 in | Wt 174.8 lb

## 2020-06-12 DIAGNOSIS — E890 Postprocedural hypothyroidism: Secondary | ICD-10-CM

## 2020-06-12 NOTE — Progress Notes (Signed)
Patient ID: Tracy Carson, female   DOB: 1981/05/18, 39 y.o.   MRN: [id number]                                                                                                               Reason for Appointment:  follow-up visit    Chief complaint: Follow-up of thyroid   History of Present Illness:   Prior history: Her hyperthyroidism was diagnosed in 2012 before a pregnancy At that time she had symptoms of palpitations, shakiness, feeling excessively warm, difficulty sleeping, increased appetite and hair loss She was treated with methimazole She was recommended I-131 treatment in 2014 but even though she had an uptake test done she did not go for the treatment Subsequently has been very irregular with her follow-up program and medications She says she was taking her methimazole 2 tablets twice a day every third day or so because of cost Because of worsening symptoms of palpitations, feeling excessively hot and losing weight she presented to the emergency room on 11/08/16 and was admitted for 2 days  On her initial consultation in her free T4 was still very high at 3.04 and she was having problems with fatigue, weakness and heat intolerance along with tachycardia. Her methimazole was increased to 3 tablets twice a day initially   She finally agreed to do that I-131 treatment and this was done with 19.5 mCi on 02/16/17  RECENT history  In October 2018 she had normal thyroid functions and had no complaints but did not follow-up as directed subsequently In June 2020 at the ER she thought she had swollen glands in her neck along with anxiety and palpitations At that time her thyroid levels showed hyperthyroidism and free T4 was significantly high at 2.3 She was started on methimazole 10 mg twice daily by her PCP on 01/11/2019  Because of persistent hyperthyroidism she has been treated with 12 mCi of I-131 on 01/17/2020  She became hypothyroid in 8/21 with significantly low free  T4 However at that time she was asymptomatic  With 100 mcg of levothyroxine her TSH was still about 12 and free T4 had become much lower She was also complaining of fatigue as well as swelling of her eyes  More recently she has not had swelling of her eyes and no blurred or double vision  Now she is taking 125 mg of LEVOTHYROXINE  TSH is back to normal at 3 and also free T4    Wt Readings from Last 3 Encounters:  06/12/20 174 lb 12.8 oz (79.3 kg)  06/04/20 175 lb 3.2 oz (79.5 kg)  05/01/20 175 lb (79.4 kg)    Thyroid function tests as follows:     Lab Results  Component Value Date   FREET4 0.96 06/10/2020   FREET4 0.28 (L) 04/28/2020   FREET4 0.41 (L) 03/03/2020   T3FREE 3.6 10/15/2019   T3FREE 3.2 05/24/2019   T3FREE 4.3 (H) 04/10/2019   TSH 2.99 06/10/2020   TSH 11.980 (H) 04/29/2020   TSH 14.36 (H) 04/28/2020  No results found for: THYROTRECAB   Allergies as of 06/12/2020   No Known Allergies     Medication List       Accurate as of June 12, 2020 11:01 AM. If you have any questions, ask your nurse or doctor.        cetirizine 10 MG tablet Commonly known as: ZyrTEC Allergy Take 1 tablet (10 mg total) by mouth daily.   famotidine 20 MG tablet Commonly known as: PEPCID Take 1 tablet (20 mg total) by mouth 2 (two) times daily.   hydrOXYzine 25 MG tablet Commonly known as: ATARAX/VISTARIL Take 1 tablet (25 mg total) by mouth 3 (three) times daily as needed.   levothyroxine 125 MCG tablet Commonly known as: SYNTHROID Take 1 tablet (125 mcg total) by mouth daily.   meloxicam 7.5 MG tablet Commonly known as: Mobic Take 1 tablet (7.5 mg total) by mouth daily.   naproxen 500 MG tablet Commonly known as: NAPROSYN Take 1 tablet (500 mg total) by mouth 2 (two) times daily with a meal.   nicotine 7 mg/24hr patch Commonly known as: Nicoderm CQ Place 1 patch (7 mg total) onto the skin daily.   promethazine-dextromethorphan 6.25-15 MG/5ML  syrup Commonly known as: PROMETHAZINE-DM Take 5 mLs by mouth at bedtime as needed for cough.   pseudoephedrine 30 MG tablet Commonly known as: SUDAFED Take 1 tablet (30 mg total) by mouth every 8 (eight) hours as needed for congestion.   tiZANidine 4 MG tablet Commonly known as: Zanaflex Take 1 tablet (4 mg total) by mouth every 8 (eight) hours as needed for muscle spasms.   topiramate 25 MG tablet Commonly known as: TOPAMAX Take 1 tablet (25 mg total) by mouth 2 (two) times daily.           Past Medical History:  Diagnosis Date  . Hypertension   . Thyroid disease    hyper  . Tuberculosis    as a child    Past Surgical History:  Procedure Laterality Date  . CESAREAN SECTION  07/02/2011   Procedure: CESAREAN SECTION;  Surgeon: Roseanna Rainbow, MD;  Location: [facility];  Service: Gynecology;  Laterality: N/A;  . CHOLECYSTECTOMY  2002    Family History  Problem Relation Age of Onset  . Diabetes Mother   . Hypertension Father   . Thyroid disease Paternal Grandmother   . Thyroid disease Sister   . Thyroid disease Brother     Social History:  reports that she has been smoking cigars. She has a 5.00 pack-year smoking history. She has quit using smokeless tobacco. She reports that she does not drink alcohol and does not use drugs.  Allergies: No Known Allergies   Review of Systems   Having regular menstrual cycles  Recently having some muscle cramps but better in the last couple of days   Examination:   BP 108/68   Pulse 67   Ht 5\' 3"  (1.6 m)   Wt 174 lb 12.8 oz (79.3 kg)   SpO2 97%   BMI 30.96 kg/m   She has mild proptosis with only minimal eyelid swelling      Assessment/Plan:   Graves' disease, treated with radioactive iodine in July 2018 and again in 12/2019  She is significantly hypothyroid Currently taking 125 mcg levothyroxine and explained to her that she will need to take this long-term  She will continue the same dose  Her eye signs  are better and does not need any intervention  For her muscle  cramps she can try empirically taking magnesium tablets if needed, not clear if these were related to her significant hypothyroidism  There are no Patient Instructions on file for this visit.   Reather Littler 06/12/2020, 11:01 AM    Note: This office note was prepared with Dragon voice recognition system technology. Any transcriptional errors that result from this process are unintentional.

## 2020-09-08 ENCOUNTER — Other Ambulatory Visit: Payer: Self-pay | Admitting: Endocrinology

## 2020-11-10 ENCOUNTER — Other Ambulatory Visit: Payer: Self-pay

## 2020-11-10 ENCOUNTER — Other Ambulatory Visit (INDEPENDENT_AMBULATORY_CARE_PROVIDER_SITE_OTHER): Payer: Self-pay

## 2020-11-10 DIAGNOSIS — E890 Postprocedural hypothyroidism: Secondary | ICD-10-CM

## 2020-11-10 LAB — TSH: TSH: 1.68 u[IU]/mL (ref 0.35–4.50)

## 2020-11-13 ENCOUNTER — Telehealth (INDEPENDENT_AMBULATORY_CARE_PROVIDER_SITE_OTHER): Payer: Self-pay | Admitting: Endocrinology

## 2020-11-13 ENCOUNTER — Other Ambulatory Visit: Payer: Self-pay

## 2020-11-13 DIAGNOSIS — E890 Postprocedural hypothyroidism: Secondary | ICD-10-CM

## 2020-11-13 NOTE — Progress Notes (Signed)
Patient ID: Tracy Carson, female   DOB: 01-24-1981, 40 y.o.   MRN: [id number]                                                                                                               Reason for Appointment:  follow-up visit   I connected with the above-named patient by video enabled telemedicine application and verified that I am speaking with the correct person. The patient was explained the limitations of evaluation and management by telemedicine and the availability of in person appointments.  Patient also understood that there may be a patient responsible charge related to this service . Location of the patient: Patient's home . Location of the provider: Physician office Only the patient and myself were participating in the encounter The patient understood the above statements and agreed to proceed.   Chief complaint: Follow-up of thyroid   History of Present Illness:   Prior history: Her hyperthyroidism was diagnosed in 2012 before a pregnancy At that time she had symptoms of palpitations, shakiness, feeling excessively warm, difficulty sleeping, increased appetite and hair loss She was treated with methimazole She was recommended I-131 treatment in 2014 but even though she had an uptake test done she did not go for the treatment Subsequently has been very irregular with her follow-up program and medications She says she was taking her methimazole 2 tablets twice a day every third day or so because of cost Because of worsening symptoms of palpitations, feeling excessively hot and losing weight she presented to the emergency room on 11/08/16 and was admitted for 2 days  On her initial consultation in her free T4 was still very high at 3.04 and she was having problems with fatigue, weakness and heat intolerance along with tachycardia. Her methimazole was increased to 3 tablets twice a day initially   She finally agreed to do that I-131 treatment and this was done with 19.5  mCi on 02/16/17  RECENT history  In October 2018 she had normal thyroid functions and had no complaints but did not follow-up as directed subsequently In June 2020 at the ER she thought she had swollen glands in her neck along with anxiety and palpitations At that time her thyroid levels showed hyperthyroidism and free T4 was significantly high at 2.3 She was started on methimazole 10 mg twice daily by her PCP on 01/11/2019  Because of persistent hyperthyroidism she has been treated with 12 mCi of I-131 on 01/17/2020  She became hypothyroid in 8/21 with significantly low free T4 However at that time she was asymptomatic  With 100 mcg of levothyroxine her TSH was still about 12 and free T4 lower She was also complaining of fatigue as well as swelling of her eyes Now she is taking 125 mg of LEVOTHYROXINE  Currently she has been taking her levothyroxine very consistently every morning before eating  She thinks that she is possibly gaining weight and feels heavy but is not difficult to explain her symptoms.  Has not checked her weight No unusual  fatigue, drowsiness No muscle cramps  She has occasional swelling of her eyes but not consistently and no excessive problems  TSH is now consistently normal at 1.7  Wt Readings from Last 3 Encounters:  06/12/20 174 lb 12.8 oz (79.3 kg)  06/04/20 175 lb 3.2 oz (79.5 kg)  05/01/20 175 lb (79.4 kg)    Thyroid function tests as follows:     Lab Results  Component Value Date   FREET4 0.96 06/10/2020   FREET4 0.28 (L) 04/28/2020   FREET4 0.41 (L) 03/03/2020   T3FREE 3.6 10/15/2019   T3FREE 3.2 05/24/2019   T3FREE 4.3 (H) 04/10/2019   TSH 1.68 11/10/2020   TSH 2.99 06/10/2020   TSH 11.980 (H) 04/29/2020    No results found for: THYROTRECAB   Allergies as of 11/13/2020   No Known Allergies     Medication List       Accurate as of November 13, 2020  9:57 AM. If you have any questions, ask your nurse or doctor.        cetirizine 10  MG tablet Commonly known as: ZyrTEC Allergy Take 1 tablet (10 mg total) by mouth daily.   famotidine 20 MG tablet Commonly known as: PEPCID Take 1 tablet (20 mg total) by mouth 2 (two) times daily.   hydrOXYzine 25 MG tablet Commonly known as: ATARAX/VISTARIL Take 1 tablet (25 mg total) by mouth 3 (three) times daily as needed.   levothyroxine 125 MCG tablet Commonly known as: SYNTHROID TAKE 1 TABLET (125 MCG TOTAL) BY MOUTH DAILY.   levothyroxine 125 MCG tablet Commonly known as: SYNTHROID TAKE 1 TABLET (125 MCG TOTAL) BY MOUTH DAILY.   levothyroxine 125 MCG tablet Commonly known as: SYNTHROID TAKE 1 TABLET (125 MCG TOTAL) BY MOUTH DAILY.   meloxicam 7.5 MG tablet Commonly known as: Mobic Take 1 tablet (7.5 mg total) by mouth daily.   naproxen 500 MG tablet Commonly known as: NAPROSYN Take 1 tablet (500 mg total) by mouth 2 (two) times daily with a meal.   nicotine 7 mg/24hr patch Commonly known as: Nicoderm CQ Place 1 patch (7 mg total) onto the skin daily.   nicotine 7 mg/24hr patch Commonly known as: NICODERM CQ - dosed in mg/24 hr PLACE 1 PATCH (7 MG TOTAL) ONTO THE SKIN DAILY.   promethazine-dextromethorphan 6.25-15 MG/5ML syrup Commonly known as: PROMETHAZINE-DM Take 5 mLs by mouth at bedtime as needed for cough.   pseudoephedrine 30 MG tablet Commonly known as: SUDAFED Take 1 tablet (30 mg total) by mouth every 8 (eight) hours as needed for congestion.   tiZANidine 4 MG tablet Commonly known as: Zanaflex Take 1 tablet (4 mg total) by mouth every 8 (eight) hours as needed for muscle spasms.   tiZANidine 4 MG tablet Commonly known as: ZANAFLEX TAKE 1 TABLET (4 MG TOTAL) BY MOUTH EVERY 8 (EIGHT) HOURS AS NEEDED FOR MUSCLE SPASMS.   topiramate 25 MG tablet Commonly known as: TOPAMAX Take 1 tablet (25 mg total) by mouth 2 (two) times daily.           Past Medical History:  Diagnosis Date  . Hypertension   . Thyroid disease    hyper  .  Tuberculosis    as a child    Past Surgical History:  Procedure Laterality Date  . CESAREAN SECTION  07/02/2011   Procedure: CESAREAN SECTION;  Surgeon: Roseanna Rainbow, MD;  Location: [facility];  Service: Gynecology;  Laterality: N/A;  . CHOLECYSTECTOMY  2002  Family History  Problem Relation Age of Onset  . Diabetes Mother   . Hypertension Father   . Thyroid disease Paternal Grandmother   . Thyroid disease Sister   . Thyroid disease Brother     Social History:  reports that she has been smoking cigars. She has a 5.00 pack-year smoking history. She has quit using smokeless tobacco. She reports that she does not drink alcohol and does not use drugs.  Allergies: No Known Allergies   Review of Systems   Having regular menstrual cycles     Examination:   There were no vitals taken for this visit.        Assessment/Plan:   Graves' disease, treated with radioactive iodine in July 2018 and again in 12/2019  She has had hypothyroidism since 02/2020  Currently taking 125 mcg levothyroxine and with this she is subjectively doing well  TSH is now consistently normal  She will continue the same dose and follow-up annually  No recent recurrence of ophthalmopathy symptoms  There are no Patient Instructions on file for this visit.   Tracy Carson 11/13/2020, 9:57 AM    Note: This office note was prepared with Dragon voice recognition system technology. Any transcriptional errors that result from this process are unintentional.

## 2020-11-17 ENCOUNTER — Other Ambulatory Visit: Payer: Self-pay

## 2020-11-17 MED FILL — Levothyroxine Sodium Tab 125 MCG: ORAL | 30 days supply | Qty: 30 | Fill #0 | Status: AC

## 2020-11-18 ENCOUNTER — Other Ambulatory Visit: Payer: Self-pay

## 2020-12-18 MED FILL — Levothyroxine Sodium Tab 125 MCG: ORAL | 30 days supply | Qty: 30 | Fill #1 | Status: AC

## 2020-12-19 ENCOUNTER — Other Ambulatory Visit: Payer: Self-pay

## 2021-01-05 ENCOUNTER — Other Ambulatory Visit: Payer: Self-pay

## 2021-01-19 ENCOUNTER — Other Ambulatory Visit: Payer: Self-pay | Admitting: Endocrinology

## 2021-01-19 MED ORDER — LEVOTHYROXINE SODIUM 125 MCG PO TABS
ORAL_TABLET | Freq: Every day | ORAL | 3 refills | Status: DC
  Filled 2021-01-19: qty 30, 30d supply, fill #0
  Filled 2021-02-19: qty 30, 30d supply, fill #1
  Filled 2021-03-25: qty 30, 30d supply, fill #2
  Filled 2021-04-29: qty 30, 30d supply, fill #3

## 2021-01-20 ENCOUNTER — Other Ambulatory Visit: Payer: Self-pay

## 2021-02-09 ENCOUNTER — Other Ambulatory Visit: Payer: Self-pay

## 2021-02-19 ENCOUNTER — Other Ambulatory Visit: Payer: Self-pay

## 2021-02-20 ENCOUNTER — Other Ambulatory Visit: Payer: Self-pay

## 2021-03-25 ENCOUNTER — Other Ambulatory Visit: Payer: Self-pay

## 2021-03-27 ENCOUNTER — Other Ambulatory Visit: Payer: Self-pay

## 2021-04-29 ENCOUNTER — Other Ambulatory Visit: Payer: Self-pay

## 2021-05-04 ENCOUNTER — Emergency Department (HOSPITAL_COMMUNITY)
Admission: EM | Admit: 2021-05-04 | Discharge: 2021-05-04 | Disposition: A | Discharge status: Home / Self Care | Payer: Self-pay | Attending: Emergency Medicine | Admitting: Emergency Medicine

## 2021-05-04 ENCOUNTER — Other Ambulatory Visit: Payer: Self-pay

## 2021-05-04 ENCOUNTER — Encounter (HOSPITAL_COMMUNITY): Payer: Self-pay | Admitting: Emergency Medicine

## 2021-05-04 DIAGNOSIS — L02811 Cutaneous abscess of head [any part, except face]: Secondary | ICD-10-CM | POA: Insufficient documentation

## 2021-05-04 DIAGNOSIS — Z79899 Other long term (current) drug therapy: Secondary | ICD-10-CM | POA: Insufficient documentation

## 2021-05-04 DIAGNOSIS — I10 Essential (primary) hypertension: Secondary | ICD-10-CM | POA: Insufficient documentation

## 2021-05-04 DIAGNOSIS — H6001 Abscess of right external ear: Secondary | ICD-10-CM | POA: Insufficient documentation

## 2021-05-04 DIAGNOSIS — L0291 Cutaneous abscess, unspecified: Secondary | ICD-10-CM

## 2021-05-04 DIAGNOSIS — E039 Hypothyroidism, unspecified: Secondary | ICD-10-CM | POA: Insufficient documentation

## 2021-05-04 DIAGNOSIS — F17210 Nicotine dependence, cigarettes, uncomplicated: Secondary | ICD-10-CM | POA: Insufficient documentation

## 2021-05-04 MED ORDER — DOXYCYCLINE HYCLATE 100 MG PO CAPS: 100.0000 mg | ORAL_CAPSULE | Freq: Two times a day (BID) | ORAL | 0 refills | Status: DC

## 2021-05-04 MED ORDER — IBUPROFEN 600 MG PO TABS: 600.0000 mg | ORAL_TABLET | Freq: Four times a day (QID) | ORAL | 0 refills | Status: DC | PRN

## 2021-05-04 NOTE — Discharge Instructions (Signed)
Take medications as prescribed. Follow up with your doctor in 3 days for recheck.   If symptoms worsen, return to the ED for further evaluation.

## 2021-05-04 NOTE — ED Provider Notes (Signed)
[facility] EMERGENCY DEPARTMENT Provider Note   CSN: [id number] Arrival date & time: 05/04/21  1949     History Chief Complaint  Patient presents with   Otalgia    Alonah Lineback is a 40 y.o. female.  Patient to ED with painful swelling behind her right ear x 5 days. She states it started as a pimple. No fever. No facial or jaw pain. No drainage.   The history is provided by the patient. No language interpreter was used.  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   Tachycardia 11/03/2012   Hematochezia 11/03/2012   Graves disease 11/03/2012   Anemia 11/03/2012   Breech presentation 07/02/2011   Cesarean delivery, without mention of indication, delivered, with or without mention of antepartum condition 07/02/2011    Past Surgical History:  Procedure Laterality Date   CESAREAN SECTION  07/02/2011   Procedure: CESAREAN SECTION;  Surgeon: Roseanna Rainbow, MD;  Location: [facility];  Service: Gynecology;  Laterality: N/A;   CHOLECYSTECTOMY  2002     OB History     Gravida  4   Para  4   Term  4   Preterm      AB      Living  4      SAB      IAB      Ectopic      Multiple      Live Births  3           Family History  Problem Relation Age of Onset   Diabetes Mother    Hypertension Father    Thyroid disease Paternal Grandmother    Thyroid disease Sister    Thyroid disease Brother     Social History   Tobacco Use   Smoking status:  Some Days    Packs/day: 5.00    Years: 1.00    Pack years: 5.00    Types: Cigars, Cigarettes   Smokeless tobacco: Former   Tobacco comments:    03/28/19 7-8/day  Substance Use Topics   Alcohol use: No    Comment: quit 10/2018   Drug use: No    Home Medications Prior to Admission medications   Medication Sig Start Date End Date Taking? Authorizing Provider  cetirizine (ZYRTEC ALLERGY) 10 MG tablet Take 1 tablet (10 mg total) by mouth daily. Patient not taking: Reported on 06/12/2020 03/17/20   Wallis Bamberg, PA-C  famotidine (PEPCID) 20 MG tablet Take 1 tablet (20 mg total) by mouth 2 (two) times daily. Patient not taking: Reported on 06/12/2020 03/13/20   Wallis Bamberg, PA-C  hydrOXYzine (ATARAX/VISTARIL) 25 MG tablet Take 1 tablet (25 mg total) by mouth 3 (three) times daily as needed. Patient not taking: Reported on 06/12/2020 08/01/19   [name], MD  levothyroxine (SYNTHROID) 125 MCG tablet TAKE 1 TABLET (125 MCG TOTAL) BY MOUTH DAILY. 09/08/20   Reather Littler, MD  levothyroxine (SYNTHROID) 125  MCG tablet Take 1 tablet by mouth daily. 01/19/21 01/19/22  Reather Littler, MD  levothyroxine (SYNTHROID) 125 MCG tablet TAKE 1 TABLET (125 MCG TOTAL) BY MOUTH DAILY. 05/01/20 05/01/21  Reather Littler, MD  meloxicam (MOBIC) 7.5 MG tablet Take 1 tablet (7.5 mg total) by mouth daily. Patient not taking: Reported on 06/12/2020 12/11/19   Wallis Bamberg, PA-C  naproxen (NAPROSYN) 500 MG tablet Take 1 tablet (500 mg total) by mouth 2 (two) times daily with a meal. 03/13/20   Wallis Bamberg, PA-C  nicotine (NICODERM CQ - DOSED IN MG/24 HR) 7 mg/24hr patch PLACE 1 PATCH (7 MG TOTAL) ONTO THE SKIN DAILY. 06/04/20 06/04/21  [name], MD  nicotine (NICODERM CQ) 7 mg/24hr patch Place 1 patch (7 mg total) onto the skin daily. 06/04/20   [name], MD  tiZANidine (ZANAFLEX) 4 MG tablet Take 1 tablet (4 mg total) by mouth every 8 (eight) hours as needed for muscle spasms. Patient not taking: Reported on 06/12/2020  06/04/20   [name], MD  tiZANidine (ZANAFLEX) 4 MG tablet TAKE 1 TABLET (4 MG TOTAL) BY MOUTH EVERY 8 (EIGHT) HOURS AS NEEDED FOR MUSCLE SPASMS. 06/04/20 06/04/21  [name], MD    Allergies    Patient has no known allergies.  Review of Systems   Review of Systems  Constitutional:  Negative for fever.  HENT:  Positive for ear pain. Negative for facial swelling, sore throat and trouble swallowing.   Respiratory:  Negative for shortness of breath.   Gastrointestinal:  Negative for nausea.  Musculoskeletal:  Negative for myalgias and neck pain.  Neurological:  Negative for headaches.   Physical Exam Updated Vital Signs BP 121/79   Pulse 63   Temp 98.6 F (37 C) (Oral)   Resp 16   Ht 5\' 2"  (1.575 m)   Wt 90 kg   LMP 04/08/2021   SpO2 99%   BMI 36.29 kg/m   Physical Exam Vitals and nursing note reviewed.  HENT:     Head: Normocephalic.     Ears:     Comments: Redness and swelling of the posterior ear and scalp along the ear border. No Mastoid tenderness.     Mouth/Throat:     Mouth: Mucous membranes are moist.     Pharynx: Oropharynx is clear.  Pulmonary:     Effort: Pulmonary effort is normal.    ED Results / Procedures / Treatments   Labs (all labs ordered are listed, but only abnormal results are displayed) Labs Reviewed - No data to display  EKG None  Radiology No results found.  Procedures Procedures   Medications Ordered in ED Medications - No data to display  ED Course  I have reviewed the triage vital signs and the nursing notes.  Pertinent labs & imaging results that were available during my care of the patient were reviewed by me and considered in my medical decision making (see chart for details).    MDM Rules/Calculators/A&P                           Patient to ED with painful swelling to posterior right ear for several days.   There is swelling and redness c/w cutaneous abscess. No mastoid tenderness to suggest mastoiditis.    Discussed I&D vs trial abx and PCP follow up. Patient prefers to start abx and see her doctor later this week for recheck. Return precautions discussed.   Final Clinical Impression(s) / ED Diagnoses  Final diagnoses:  None   Abscess  Rx / DC Orders ED Discharge Orders     None        Elpidio Anis, PA-C 05/05/21 1027    Pricilla Loveless, MD 05/08/21 [phone]

## 2021-05-04 NOTE — ED Triage Notes (Signed)
Patient reports pain at back pf right ear with swelling onset last week , no hearing loss or drainage .

## 2021-05-07 ENCOUNTER — Other Ambulatory Visit: Payer: Self-pay

## 2021-05-07 ENCOUNTER — Ambulatory Visit (HOSPITAL_COMMUNITY)
Admission: EM | Admit: 2021-05-07 | Discharge: 2021-05-07 | Disposition: A | Discharge status: Home / Self Care | Payer: Self-pay | Attending: Physician Assistant | Admitting: Physician Assistant

## 2021-05-07 ENCOUNTER — Encounter (HOSPITAL_COMMUNITY): Payer: Self-pay | Admitting: Emergency Medicine

## 2021-05-07 ENCOUNTER — Ambulatory Visit: Payer: Self-pay

## 2021-05-07 DIAGNOSIS — L0291 Cutaneous abscess, unspecified: Secondary | ICD-10-CM

## 2021-05-07 DIAGNOSIS — H6001 Abscess of right external ear: Secondary | ICD-10-CM

## 2021-05-07 MED ORDER — LIDOCAINE HCL (PF) 1 % IJ SOLN
INTRAMUSCULAR | Status: AC
  Filled 2021-05-07: qty 30

## 2021-05-07 MED ORDER — LIDOCAINE HCL (PF) 1 % IJ SOLN
5.0000 mL | Freq: Once | INTRAMUSCULAR | Status: DC
  Administered 2021-05-07: 5 mL

## 2021-05-07 NOTE — Telephone Encounter (Signed)
Using Spanish interpreter, Byrd Hesselbach. # [id number]. Pt. Seen in ED 05/04/21 with abscess behind right ear. Has started to drain now and is more painful. Taking antibiotics. No availability in the practice. Instructed to go back to ED. Verbalizes understanding.   Reason for Disposition  SEVERE pain (e.g., excruciating)  Answer Assessment - Initial Assessment Questions 1. APPEARANCE of BOIL: "What does the boil look like?"      Large 2. LOCATION: "Where is the boil located?"      Behind right ear 3. NUMBER: "How many boils are there?"      1 4. SIZE: "How big is the boil?" (e.g., inches, cm; compare to size of a coin or other object)     Grape 5. ONSET: "When did the boil start?"     Monday - seen in ED 6. PAIN: "Is there any pain?" If Yes, ask: "How bad is the pain?"   (Scale 1-10; or mild, moderate, severe)     severe 7. FEVER: "Do you have a fever?" If Yes, ask: "What is it, how was it measured, and when did it start?"      No 8. SOURCE: "Have you been around anyone with boils or other Staph infections?" "Have you ever had boils before?"     No 9. OTHER SYMPTOMS: "Do you have any other symptoms?" (e.g., shaking chills, weakness, rash elsewhere on body)     No 10. PREGNANCY: "Is there any chance you are pregnant?" "When was your last menstrual period?"       No  Protocols used: Boil (Skin Abscess)-A-AH

## 2021-05-07 NOTE — ED Triage Notes (Signed)
Pt is present today with an abscess on the back of her right ear. Pt states that she noticed the abscess last week. Pt states that she tried to get an appt with PCP but there were no openings until next week

## 2021-05-07 NOTE — ED Provider Notes (Signed)
[facility]    CSN: [id number] Arrival date & time: 05/07/21  1759      History   Chief Complaint Chief Complaint  Patient presents with  . Abscess    HPI Tracy Carson is a 40 y.o. female.   Pt complains of an abscess behind her right ear.  Pt has been on antibiotics.  Pt reports still swollen and more painful   The history is provided by the patient. No language interpreter was used.  Abscess Location:  Face Abscess quality: redness and warmth   Abscess quality: not draining   Chronicity:  New Relieved by:  Nothing Worsened by:  Nothing  Past Medical History:  Diagnosis Date  . Hypertension   . Thyroid disease    hyper  . Tuberculosis    as a child    Patient Active Problem List   Diagnosis Date Noted  . 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 History of blurry vision 05/20/2014  . History of itching of eye 05/20/2014  . Other fatigue 05/20/2014  . Tobacco dependence 05/20/2014  . Palpitations 05/20/2014  . Abdominal pain 11/03/2012  . 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 Tachycardia 11/03/2012  . Hematochezia 11/03/2012  . Graves disease 11/03/2012  . Anemia 11/03/2012  . Breech presentation 07/02/2011  . Cesarean delivery, without mention of indication, delivered, with or without mention of antepartum condition 07/02/2011    Past Surgical History:  Procedure Laterality Date  . CESAREAN SECTION  07/02/2011   Procedure: CESAREAN SECTION;  Surgeon: Roseanna Rainbow, MD;  Location: [facility];  Service: Gynecology;  Laterality: N/A;  . CHOLECYSTECTOMY  2002    OB History     Gravida  4   Para  4   Term  4   Preterm      AB      Living  4      SAB      IAB      Ectopic      Multiple      Live Births  3            Home Medications    Prior to Admission medications   Medication Sig Start Date End Date Taking? Authorizing Provider  cetirizine (ZYRTEC ALLERGY) 10 MG tablet Take 1 tablet (10 mg total) by  mouth daily. Patient not taking: Reported on 06/12/2020 03/17/20   Wallis Bamberg, PA-C  doxycycline (VIBRAMYCIN) 100 MG capsule Take 1 capsule (100 mg total) by mouth 2 (two) times daily. 05/04/21   Elpidio Anis, PA-C  famotidine (PEPCID) 20 MG tablet Take 1 tablet (20 mg total) by mouth 2 (two) times daily. Patient not taking: Reported on 06/12/2020 03/13/20   Wallis Bamberg, PA-C  hydrOXYzine (ATARAX/VISTARIL) 25 MG tablet Take 1 tablet (25 mg total) by mouth 3 (three) times daily as needed. Patient not taking: Reported on 06/12/2020 08/01/19   [name], MD  ibuprofen (ADVIL) 600 MG tablet Take 1 tablet (600 mg total) by mouth every 6 (six) hours as needed. 05/04/21   Elpidio Anis, PA-C  levothyroxine (SYNTHROID) 125 MCG tablet TAKE 1 TABLET (125 MCG TOTAL) BY MOUTH DAILY. 09/08/20   Reather Littler, MD  levothyroxine (SYNTHROID) 125 MCG tablet Take 1 tablet by mouth daily. 01/19/21 01/19/22  Reather Littler, MD  levothyroxine (SYNTHROID) 125 MCG tablet TAKE 1 TABLET (125 MCG TOTAL) BY MOUTH DAILY. 05/01/20 05/01/21  Reather Littler, MD  meloxicam (MOBIC) 7.5 MG tablet Take 1 tablet (7.5 mg total) by mouth daily. Patient not  taking: Reported on 06/12/2020 12/11/19   Wallis Bamberg, PA-C  naproxen (NAPROSYN) 500 MG tablet Take 1 tablet (500 mg total) by mouth 2 (two) times daily with a meal. 03/13/20   Wallis Bamberg, PA-C  nicotine (NICODERM CQ - DOSED IN MG/24 HR) 7 mg/24hr patch PLACE 1 PATCH (7 MG TOTAL) ONTO THE SKIN DAILY. 06/04/20 06/04/21  [name], MD  nicotine (NICODERM CQ) 7 mg/24hr patch Place 1 patch (7 mg total) onto the skin daily. 06/04/20   [name], MD  tiZANidine (ZANAFLEX) 4 MG tablet Take 1 tablet (4 mg total) by mouth every 8 (eight) hours as needed for muscle spasms. Patient not taking: Reported on 06/12/2020 06/04/20   [name], MD  tiZANidine (ZANAFLEX) 4 MG tablet TAKE 1 TABLET (4 MG TOTAL) BY MOUTH EVERY 8 (EIGHT) HOURS AS NEEDED FOR MUSCLE SPASMS. 06/04/20 06/04/21   [name], MD    Family History Family History  Problem Relation Age of Onset  . Diabetes Mother   . Hypertension Father   . Thyroid disease Paternal Grandmother   . Thyroid disease Sister   . Thyroid disease Brother     Social History Social History   Tobacco Use  . Smoking status: Some Days    Packs/day: 5.00    Years: 1.00    Pack years: 5.00    Types: Cigars, Cigarettes  . Smokeless tobacco: Former  . Tobacco comments:    03/28/19 7-8/day  Substance Use Topics  . Alcohol use: No    Comment: quit 10/2018  . Drug use: No     Allergies   Patient has no known allergies.   Review of Systems Review of Systems  All other systems reviewed and are negative.   Physical Exam Triage Vital Signs ED Triage Vitals  Enc Vitals Group     BP 05/07/21 1856 139/78     Pulse Rate 05/07/21 1856 66     Resp 05/07/21 1856 18     Temp 05/07/21 1856 98.9 F (37.2 C)     Temp src --      SpO2 05/07/21 1856 99 %     Weight --      Height --      Head Circumference --      Peak Flow --      Pain Score 05/07/21 1855 5     Pain Loc --      Pain Edu? --      Excl. in GC? --    No data found.  Updated Vital Signs BP 139/78   Pulse 66   Temp 98.9 F (37.2 C)   Resp 18   LMP 04/08/2021   SpO2 99%   Visual Acuity Right Eye Distance:   Left Eye Distance:   Bilateral Distance:    Right Eye Near:   Left Eye Near:    Bilateral Near:     Physical Exam Vitals and nursing note reviewed.  Constitutional:      Appearance: She is well-developed.  HENT:     Head: Normocephalic.     Comments: 1cm swollen red area behind right ear  Pulmonary:     Effort: Pulmonary effort is normal.  Abdominal:     General: There is no distension.  Neurological:     Mental Status: She is alert and oriented to person, place, and time.     UC Treatments / Results  Labs (all labs ordered are listed, but only abnormal results are displayed) Labs Reviewed - No  data to  display  EKG   Radiology No results found.  Procedures Incision and Drainage  Date/Time: 05/07/2021 7:44 PM Performed by: Elson Areas, PA-C Authorized by: Elson Areas, PA-C   Consent:    Consent obtained:  Verbal   Consent given by:  Patient   Alternatives discussed:  No treatment Universal protocol:    Procedure explained and questions answered to patient or proxy's satisfaction: yes     Immediately prior to procedure, a time out was called: yes     Patient identity confirmed:  Verbally with patient Location:    Type:  Abscess   Size:  1   Location:  Head   Head location:  R external ear Pre-procedure details:    Skin preparation:  Antiseptic wash Anesthesia:    Anesthesia method:  Local infiltration Procedure details:    Incision types:  Single straight   Drainage:  Purulent   Wound treatment:  Wound left open Post-procedure details:    Procedure completion:  Tolerated (including critical care time)  Medications Ordered in UC Medications - No data to display  Initial Impression / Assessment and Plan / UC Course  I have reviewed the triage vital signs and the nursing notes.  Pertinent labs & imaging results that were available during my care of the patient were reviewed by me and considered in my medical decision making (see chart for details).     Continue antibiotic, soak area, return if any problems.  Final Clinical Impressions(s) / UC Diagnoses   Final diagnoses:  None   Discharge Instructions   None    ED Prescriptions   None    PDMP not reviewed this encounter. An After Visit Summary was printed and given to the patient.    Elson Areas, [place] 05/07/21 2039

## 2021-05-07 NOTE — Telephone Encounter (Signed)
Noted  

## 2021-05-29 ENCOUNTER — Other Ambulatory Visit: Payer: Self-pay | Admitting: Endocrinology

## 2021-05-29 MED ORDER — LEVOTHYROXINE SODIUM 125 MCG PO TABS
ORAL_TABLET | Freq: Every day | ORAL | 3 refills | Status: DC
  Filled 2021-05-29: qty 30, 30d supply, fill #0
  Filled 2021-07-13: qty 30, 30d supply, fill #1
  Filled 2021-08-20: qty 30, 30d supply, fill #2
  Filled 2021-08-20: qty 30, 30d supply, fill #0
  Filled 2021-09-29: qty 30, 30d supply, fill #1

## 2021-06-01 ENCOUNTER — Other Ambulatory Visit: Payer: Self-pay

## 2021-06-02 ENCOUNTER — Other Ambulatory Visit: Payer: Self-pay

## 2021-07-14 ENCOUNTER — Other Ambulatory Visit: Payer: Self-pay

## 2021-08-20 ENCOUNTER — Other Ambulatory Visit: Payer: Self-pay

## 2021-08-21 ENCOUNTER — Other Ambulatory Visit: Payer: Self-pay

## 2021-08-26 ENCOUNTER — Ambulatory Visit: Payer: Self-pay | Admitting: *Deleted

## 2021-08-26 ENCOUNTER — Encounter (HOSPITAL_COMMUNITY): Payer: Self-pay | Admitting: Emergency Medicine

## 2021-08-26 ENCOUNTER — Other Ambulatory Visit: Payer: Self-pay

## 2021-08-26 ENCOUNTER — Ambulatory Visit (INDEPENDENT_AMBULATORY_CARE_PROVIDER_SITE_OTHER): Payer: Self-pay

## 2021-08-26 ENCOUNTER — Ambulatory Visit (HOSPITAL_COMMUNITY)
Admission: EM | Admit: 2021-08-26 | Discharge: 2021-08-26 | Disposition: A | Discharge status: Home / Self Care | Payer: Self-pay | Attending: Family Medicine | Admitting: Family Medicine

## 2021-08-26 DIAGNOSIS — R079 Chest pain, unspecified: Secondary | ICD-10-CM

## 2021-08-26 DIAGNOSIS — B309 Viral conjunctivitis, unspecified: Secondary | ICD-10-CM

## 2021-08-26 DIAGNOSIS — R0789 Other chest pain: Secondary | ICD-10-CM

## 2021-08-26 MED ORDER — DICLOFENAC SODIUM 75 MG PO TBEC
75.0000 mg | DELAYED_RELEASE_TABLET | Freq: Two times a day (BID) | ORAL | 0 refills | Status: DC
  Filled 2021-08-26: qty 14, 7d supply, fill #0

## 2021-08-26 MED ORDER — OLOPATADINE HCL 0.2 % OP SOLN
1.0000 [drp] | Freq: Every day | OPHTHALMIC | 0 refills | Status: DC
  Filled 2021-08-26: qty 2.5, 25d supply, fill #0

## 2021-08-26 NOTE — Discharge Instructions (Addendum)
You have been seen at the [facility] today for chest pain. Your evaluation today was not suggestive of any emergent condition requiring medical intervention at this time. Your chest x-ray ECG (heart tracing) did not show any worrisome changes. However, some medical problems make take more time to appear. Therefore, it's very important that you pay attention to any new symptoms or worsening of your current condition.  Please proceed directly to the Emergency Department immediately should you feel worse in any way or have any of the following symptoms: increasing or different chest pain, pain that spreads to your arm, neck, jaw, back or abdomen, shortness of breath, or nausea and vomiting.

## 2021-08-26 NOTE — ED Triage Notes (Signed)
3 days ago had pain in head and neck.  Today pain in chest.  Feels like pressure.  No sob.  Sneezing, coughing or deep breathing does not make pain worse.  Bp 136/106 per patient.  144/94 prior to coming to urgent care.  Called pcp and there were no appts available.

## 2021-08-26 NOTE — Telephone Encounter (Signed)
° ° ° °  Chief Complaint: Chest pain Symptoms: Left sided chest pain 7/10, "RIght at heart." constant. Headache,Nausea, BP 133/106 Frequency: This AM, headache x 3 days Pertinent Negatives: Patient denies pain does not radiate Disposition: [x] ED /[] Urgent Care (no appt availability in office) / [] Appointment(In office/virtual)/ []  [facility]/ [] Home Care/ [] Refused Recommended Disposition /[] [place] [facility]/ []  Follow-up with PCP Additional Notes: Assisted by [facility] # [phone] Reason for Disposition  [1] Chest pain lasts > 5 minutes AND [2] occurred in past 3 days (72 hours) (Exception: feels exactly the same as previously diagnosed heartburn and has accompanying sour taste in mouth)  Answer Assessment - Initial Assessment Questions 1. LOCATION: "Where does it hurt?"       Left sided at heart 2. RADIATION: "Does the pain go anywhere else?" (e.g., into neck, jaw, arms, back)     no 3. ONSET: "When did the chest pain begin?" (Minutes, hours or days)      3 days of headache pain today 4. PATTERN "Does the pain come and go, or has it been constant since it started?"  "Does it get worse with exertion?"      Constant 5. DURATION: "How long does it last" (e.g., seconds, minutes, hours)      6. SEVERITY: "How bad is the pain?"  (e.g., Scale 1-10; mild, moderate, or severe)    - MILD (1-3): doesn't interfere with normal activities     - MODERATE (4-7): interferes with normal activities or awakens from sleep    - SEVERE (8-10): excruciating pain, unable to do any normal activities       6/10  9. CAUSE: "What do you think is causing the chest pain?"     Unsure 10. OTHER SYMPTOMS: "Do you have any other symptoms?" (e.g., dizziness, nausea, vomiting, sweating, fever, difficulty breathing, cough)       Nausea, BP 133/106 78 HR  Protocols used: Chest Pain-A-AH

## 2021-08-27 ENCOUNTER — Other Ambulatory Visit: Payer: Self-pay

## 2021-08-27 NOTE — ED Provider Notes (Signed)
[facility]   [id number] 08/26/21 Arrival Time: 1719  ASSESSMENT & PLAN:  1. Chest pain, unspecified type   2. Chest wall pain   3. Acute viral conjunctivitis of both eyes     Patient history and exam consistent with non-cardiac cause of chest pain. Prescription NSAIDs per medication orders.  I have personally viewed the imaging studies ordered this visit. No acute changes on CXR. No pneumothorax.  Local eye care discussed.   Meds ordered this encounter  Medications   diclofenac (VOLTAREN) 75 MG EC tablet    Sig: Take 1 tablet (75 mg total) by mouth 2 (two) times daily.    Dispense:  14 tablet    Refill:  0   Olopatadine HCl 0.2 % SOLN    Sig: Apply 1 drop to eye daily.    Dispense:  2.5 mL    Refill:  0    Chest pain precautions given.    Discharge Instructions      You have been seen at the [facility] today for chest pain. Your evaluation today was not suggestive of any emergent condition requiring medical intervention at this time. Your chest x-ray ECG (heart tracing) did not show any worrisome changes. However, some medical problems make take more time to appear. Therefore, it's very important that you pay attention to any new symptoms or worsening of your current condition.  Please proceed directly to the Emergency Department immediately should you feel worse in any way or have any of the following symptoms: increasing or different chest pain, pain that spreads to your arm, neck, jaw, back or abdomen, shortness of breath, or nausea and vomiting.      Reviewed expectations re: course of current medical issues. Questions answered. Outlined signs and symptoms indicating need for more acute intervention. Patient verbalized understanding. After Visit Summary given.   SUBJECTIVE:  History from: patient. Tracy Carson is a 41 y.o. female who presents with complaint of intermittent L upper chest discomfort. First noted 3 d ago. No injury.  Feels with certain movements and with deep breaths. No SOB/n/v/diaphoresis. Occas cough. Increased blood pressure noted today at home today by her reports 140s/100s. Reports that she is not currently treated for HTN. She reports no swelling of ankles, no orthostatic dizziness or lightheadedness, no orthopnea or paroxysmal nocturnal dyspnea, and no palpitations. Denies: near-syncope and syncope. Recent illnesses: none. Fever: absent. Ambulatory without assistance. Self/OTC treatment: none. History of similar: no. Recreational drug use: denied. Also notes bilat eye irritation x 1-2 d with watery drainage.  Social History   Tobacco Use  Smoking Status Some Days   Packs/day: 5.00   Years: 1.00   Pack years: 5.00   Types: Cigars, Cigarettes  Smokeless Tobacco Former  Tobacco Comments   03/28/19 7-8/day   Social History   Substance and Sexual Activity  Alcohol Use No   Comment: quit 10/2018    OBJECTIVE:  Vitals:   08/26/21 1743  BP: 120/82  Pulse: 77  Resp: 18  Temp: 98.7 F (37.1 C)  TempSrc: Oral  SpO2: 99%    General appearance: alert, oriented, no acute distress Eyes: PERRLA; EOMI; conjunctivae with mild injection and watery drainage HENT: normocephalic; atraumatic Neck: supple with FROM Lungs: without labored respirations; speaks full sentences without difficulty; CTAB Heart: regular rate and rhythm without murmer Chest Wall: with tenderness to palpation over upper LSB (reports this reproduces pain described in history) Abdomen: soft, non-tender; no guarding or rebound tenderness Extremities: without edema;  without calf swelling or tenderness; symmetrical without gross deformities Skin: warm and dry; without rash or lesions Neuro: normal gait Psychological: alert and cooperative; normal mood and affect  Imaging: DG Chest 2 View  Result Date: 08/26/2021 CLINICAL DATA:  CP EXAM: CHEST - 2 VIEW COMPARISON:  02/04/2020. FINDINGS: No consolidation. No visible pleural  effusions or pneumothorax. Cardiomediastinal silhouette is within normal limits. Cholecystectomy clips. IMPRESSION: No evidence of acute cardiopulmonary disease. Electronically Signed   By: Feliberto Harts M.D.   On: 08/26/2021 18:37     No Known Allergies  Past Medical History:  Diagnosis Date   Hypertension    Thyroid disease    hyper   Tuberculosis    as a child   Social History   Socioeconomic History   Marital status: Married    Spouse name: Blossom Hoops   Number of children: 4   Years of education: 7   Highest education level: Not on file  Occupational History    Comment: na  Tobacco Use   Smoking status: Some Days    Packs/day: 5.00    Years: 1.00    Pack years: 5.00    Types: Cigars, Cigarettes   Smokeless tobacco: Former   Tobacco comments:    03/28/19 7-8/day  Vaping Use   Vaping Use: Never used  Substance and Sexual Activity   Alcohol use: No    Comment: quit 10/2018   Drug use: No   Sexual activity: Yes    Birth control/protection: None  Other Topics Concern   Not on file  Social History Narrative   Lives with spouse, 4 daughters   Some caffeine   Social Determinants of Health   Financial Resource Strain: Not on file  Food Insecurity: Not on file  Transportation Needs: Not on file  Physical Activity: Not on file  Stress: Not on file  Social Connections: Not on file  Intimate Partner Violence: Not on file   Family History  Problem Relation Age of Onset   Diabetes Mother    Hypertension Father    Thyroid disease Paternal Grandmother    Thyroid disease Sister    Thyroid disease Brother    Past Surgical History:  Procedure Laterality Date   CESAREAN SECTION  07/02/2011   Procedure: CESAREAN SECTION;  Surgeon: Roseanna Rainbow, MD;  Location: [facility];  Service: Gynecology;  Laterality: N/A;   CHOLECYSTECTOMY  2002      Mardella Layman, MD 08/27/21 [phone]

## 2021-08-31 NOTE — Telephone Encounter (Signed)
FYI

## 2021-09-29 ENCOUNTER — Ambulatory Visit: Payer: Self-pay | Attending: Family Medicine | Admitting: Family Medicine

## 2021-09-29 ENCOUNTER — Other Ambulatory Visit: Payer: Self-pay

## 2021-09-29 ENCOUNTER — Encounter: Payer: Self-pay | Admitting: Family Medicine

## 2021-09-29 VITALS — BP 124/84 | HR 61 | Ht 62.0 in | Wt 189.8 lb

## 2021-09-29 DIAGNOSIS — N644 Mastodynia: Secondary | ICD-10-CM

## 2021-09-29 NOTE — Progress Notes (Signed)
? ?Subjective:  ?Patient ID: Tracy Carson, female    DOB: 07-Sep-1980  Age: 41 y.o. MRN: [id number] ? ?CC: Breast Pain ? ? ?HPI ?Tracy Carson is a 41 y.o. year old female with a history of treatment induced hypothyroidism (radioactive iodine treatment of Graves' disease in 2018 and 2021), migraines, anxiety, and depression, history of COVID in 02/2020 who presents today for a follow up visit.  She is followed by endocrinology for treatment of her hyperthyroidism  ?Migraines were previously managed by Neurology but she has not followed up with them lately. ? ?Interval History: ?She has had L breast pain on the lateral and medial aspect x1 month which has worsened with associated burning pain which is worse when she bends over , relieved by using Diclofenac. Denies history of trauma.She does not feel any lumps, no family history of breast cancer. In the past she uses the Nexplanon but is currently not on any contraceptive. ?She is currently on her monthly cycle. ? ?Past Medical History:  ?Diagnosis Date  ? Hypertension   ? Thyroid disease   ? hyper  ? Tuberculosis   ? as a child  ? ? ?Past Surgical History:  ?Procedure Laterality Date  ? CESAREAN SECTION  07/02/2011  ? Procedure: CESAREAN SECTION;  Surgeon: Roseanna Rainbow, MD;  Location: [facility];  Service: Gynecology;  Laterality: N/A;  ? CHOLECYSTECTOMY  2002  ? ? ?Family History  ?Problem Relation Age of Onset  ? Diabetes Mother   ? Hypertension Father   ? Thyroid disease Paternal Grandmother   ? Thyroid disease Sister   ? Thyroid disease Brother   ? ? ?No Known Allergies ? ?Outpatient Medications Prior to Visit  ?Medication Sig Dispense Refill  ? diclofenac (VOLTAREN) 75 MG EC tablet Take 1 tablet (75 mg total) by mouth 2 (two) times daily. 14 tablet 0  ? levothyroxine (SYNTHROID) 125 MCG tablet Take 1 tablet by mouth daily. 30 tablet 3  ? nicotine (NICODERM CQ) 7 mg/24hr patch Place 1 patch (7 mg total) onto the skin daily. (Patient not taking:  Reported on 09/29/2021) 28 patch 2  ? cetirizine (ZYRTEC ALLERGY) 10 MG tablet Take 1 tablet (10 mg total) by mouth daily. (Patient not taking: Reported on 06/12/2020) 30 tablet 0  ? doxycycline (VIBRAMYCIN) 100 MG capsule Take 1 capsule (100 mg total) by mouth 2 (two) times daily. (Patient not taking: Reported on 08/26/2021) 20 capsule 0  ? famotidine (PEPCID) 20 MG tablet Take 1 tablet (20 mg total) by mouth 2 (two) times daily. (Patient not taking: Reported on 06/12/2020) 60 tablet 0  ? hydrOXYzine (ATARAX/VISTARIL) 25 MG tablet Take 1 tablet (25 mg total) by mouth 3 (three) times daily as needed. (Patient not taking: Reported on 06/12/2020) 60 tablet 1  ? levothyroxine (SYNTHROID) 125 MCG tablet TAKE 1 TABLET (125 MCG TOTAL) BY MOUTH DAILY. 30 tablet 3  ? levothyroxine (SYNTHROID) 125 MCG tablet TAKE 1 TABLET (125 MCG TOTAL) BY MOUTH DAILY. 30 tablet 3  ? Olopatadine HCl 0.2 % SOLN Apply 1 drop to eye daily. 2.5 mL 0  ? tiZANidine (ZANAFLEX) 4 MG tablet Take 1 tablet (4 mg total) by mouth every 8 (eight) hours as needed for muscle spasms. (Patient not taking: Reported on 06/12/2020) 60 tablet 2  ? ?No facility-administered medications prior to visit.  ? ? ? ?ROS ?Review of Systems  ?Constitutional:  Negative for activity change, appetite change and fatigue.  ?HENT:  Negative for congestion, sinus pressure and sore  throat.   ?Eyes:  Negative for visual disturbance.  ?Respiratory:  Negative for cough, chest tightness, shortness of breath and wheezing.   ?Cardiovascular:  Negative for chest pain and palpitations.  ?Gastrointestinal:  Negative for abdominal distention, abdominal pain and constipation.  ?Endocrine: Negative for polydipsia.  ?Genitourinary:  Negative for dysuria and frequency.  ?Musculoskeletal:  Negative for arthralgias and back pain.  ?Skin:  Negative for rash.  ?Neurological:  Negative for tremors, light-headedness and numbness.  ?Hematological:  Does not bruise/bleed easily.  ?Psychiatric/Behavioral:   Negative for agitation and behavioral problems.   ? ?Objective:  ?BP 124/84   Pulse 61   Ht 5\' 2"  (1.575 m)   Wt 189 lb 12.8 oz (86.1 kg)   SpO2 99%   BMI 34.71 kg/m?  ? ?BP/Weight 09/29/2021 08/26/2021 05/07/2021  ?Systolic BP 124 120 139  ?Diastolic BP 84 82 78  ?Wt. (Lbs) 189.8 - -  ?BMI 34.71 - -  ? ? ? ? ?Physical Exam ?Constitutional:   ?   Appearance: She is well-developed.  ?Cardiovascular:  ?   Rate and Rhythm: Normal rate.  ?   Heart sounds: Normal heart sounds. No murmur heard. ?Pulmonary:  ?   Effort: Pulmonary effort is normal.  ?   Breath sounds: Normal breath sounds. No wheezing or rales.  ?Chest:  ?   Chest wall: No tenderness.  ?Breasts: ?   Right: No mass or tenderness.  ?   Left: Tenderness (medial and lateral aspect of L breast) present.  ?Abdominal:  ?   General: Bowel sounds are normal. There is no distension.  ?   Palpations: Abdomen is soft. There is no mass.  ?   Tenderness: There is no abdominal tenderness.  ?Musculoskeletal:     ?   General: Normal range of motion.  ?   Right lower leg: No edema.  ?   Left lower leg: No edema.  ?Neurological:  ?   Mental Status: She is alert and oriented to person, place, and time.  ?Psychiatric:     ?   Mood and Affect: Mood normal.  ? ? ?CMP Latest Ref Rng & Units 06/04/2020 02/13/2020 07/28/2019  ?Glucose 65 - 99 mg/dL 88 295(M) 841(L)  ?BUN 6 - 20 mg/dL 7 8 6   ?Creatinine 0.57 - 1.00 mg/dL 2.44 0.10 2.72  ?Sodium 134 - 144 mmol/L 138 136 137  ?Potassium 3.5 - 5.2 mmol/L 4.3 3.6 3.8  ?Chloride 96 - 106 mmol/L 103 109 106  ?CO2 20 - 29 mmol/L 21 18(L) 23  ?Calcium 8.7 - 10.2 mg/dL 9.1 5.3(G) 9.2  ?Total Protein 6.5 - 8.1 g/dL - - -  ?Total Bilirubin 0.3 - 1.2 mg/dL - - -  ?Alkaline Phos 38 - 126 U/L - - -  ?AST 15 - 41 U/L - - -  ?ALT 0 - 44 U/L - - -  ? ? ?Lipid Panel  ?   ?Component Value Date/Time  ? CHOL 112 11/15/2016 1038  ? TRIG 162 (H) 11/15/2016 1038  ? HDL 24 (L) 11/15/2016 1038  ? CHOLHDL 4.7 (H) 11/15/2016 1038  ? LDLCALC 56 11/15/2016 1038   ? ? ?CBC ?   ?Component Value Date/Time  ? WBC 10.4 02/13/2020 1707  ? RBC 5.10 02/13/2020 1707  ? HGB 15.8 (H) 02/13/2020 1707  ? HGB 13.3 11/15/2016 1038  ? HCT 44.8 02/13/2020 1707  ? HCT 38.1 11/15/2016 1038  ? PLT 214 02/13/2020 1707  ? PLT 185 11/15/2016 1038  ? MCV  87.8 02/13/2020 1707  ? MCV 81 11/15/2016 1038  ? MCH 31.0 02/13/2020 1707  ? MCHC 35.3 02/13/2020 1707  ? RDW 12.0 02/13/2020 1707  ? RDW 13.5 11/15/2016 1038  ? LYMPHSABS 1.7 05/28/2019 1332  ? LYMPHSABS 2.7 11/15/2016 1038  ? MONOABS 0.3 05/28/2019 1332  ? EOSABS 0.2 05/28/2019 1332  ? EOSABS 0.2 11/15/2016 1038  ? BASOSABS 0.0 05/28/2019 1332  ? BASOSABS 0.0 11/15/2016 1038  ? ? ?Lab Results  ?Component Value Date  ? HGBA1C 5.2 11/15/2016  ? ? ?Assessment & Plan:  ?1. Breast pain, left ?She is currently on her monthly cycle which could also explain pain ?We will proceed with work-up for mastodynia ?Given age she is also due for mammogram. ?- MM DIAG BREAST TOMO BILATERAL; Future ?- US BREAST LTD UNI LEFT INC AXILLA; Future ? ? ?No orders of the defined types were placed in this encounter. ? ? ?Follow-up: Return in about 6 weeks (around 11/10/2021) for L breast pain.  ? ? ? ? ? ?[name], MD, FAAFP. ?[place] [facility] ?[place], [place] ?[phone]   ?09/29/2021, 10:33 AM ?

## 2021-09-29 NOTE — Progress Notes (Signed)
Having pain in left breast. ?

## 2021-09-29 NOTE — Patient Instructions (Signed)
Dolor a la palpacin de las mamas Breast Tenderness El dolor a la palpacin de las mamas es un problema frecuente en las mujeres de todas las edades, pero tambin puede ocurrir en los hombres. El dolor a la palpacin de las mamas puede oscilar desde molestias leves hasta un dolor intenso. En las mujeres, el dolor generalmente es intermitente y se relaciona con el ciclo menstrual, pero puede ser constante. Son muchas las causas posibles del dolor a la palpacin de las mamas, incluidos los cambios hormonales, infecciones y el uso de ciertos medicamentos. Puede someterse a pruebas como una mamografa o una ecografa, para detectar hallazgos inusuales. Por lo general, el dolor a la palpacin de las mamas no significa que usted tenga cncer de mama. Siga estas instrucciones en su casa: Control del dolor y de las molestias  Si se lo indican, aplique hielo sobre la zona dolorida. Para hacer esto: Ponga el hielo en una bolsa plstica. Coloque una toalla entre la piel y la bolsa. Coloque el hielo durante 20 minutos, 2 a 3 veces por da. Use un sostn de soporte, especialmente al hacer actividad fsica. Quizs tambin desee usar ese sostn para dormir, si siente las mamas muy sensibles. Medicamentos Use los medicamentos de venta libre y los recetados solamente como se lo haya indicado el mdico. Si el dolor es causado por alguna infeccin, posiblemente le receten un antibitico. Si le recetaron un antibitico, tmelo o selo como se lo haya indicado el mdico. No deje de tomar los antibiticos aunque comience a sentirse mejor. Comida y bebida El mdico puede recomendarle disminuir el consumo de grasa en su dieta. Puede hacer lo siguiente: Limite el consumo de alimentos fritos. A la hora de cocinarlos, opte por hornearlos, hervirlos, grillarlos y asarlos a la parrilla. Disminuya la cantidad de cafena de su dieta. En cambio, beba ms agua y elija bebidas sin cafena. Instrucciones generales  Lleve un  registro de los das y las horas cuando tiene mayor sensibilidad en las mamas. Pregntele al mdico cmo debe hacerse los exmenes de mamas en su casa. Esto la ayudar a notar si tiene algn crecimiento o bulto fuera de lo normal. Concurra a todas las visitas de seguimiento como se lo haya indicado el mdico. Esto es importante. Comunquese con un mdico si: Cualquier zona de la mama est dura, enrojecida y caliente al tacto. Puede ser un signo de infeccin. Es mujer y: No est en etapa de lactancia y le sale lquido de los pezones, especialmente sangre o pus. Tiene un bulto nuevo o doloroso en la mama que no desaparece despus de la finalizacin del perodo menstrual. Tiene fiebre. El dolor no mejora o empeora. El dolor le impide realizar las actividades cotidianas. Resumen El dolor a la palpacin de las mamas puede oscilar desde molestias leves hasta un dolor intenso. Son muchas las causas posibles del dolor a la palpacin de las mamas, incluidos los cambios hormonales, infecciones y el uso de ciertos medicamentos. Puede tratarse con hielo, el uso un sostn de soporte y medicamentos. Haga cambios en la dieta como se lo haya indicado el mdico. Esta informacin no tiene como fin reemplazar el consejo del mdico. Asegrese de hacerle al mdico cualquier pregunta que tenga. Document Revised: 01/17/2019 Document Reviewed: 01/17/2019 Elsevier Patient Education  2022 Elsevier Inc.  

## 2021-10-06 ENCOUNTER — Other Ambulatory Visit: Payer: Self-pay

## 2021-10-06 DIAGNOSIS — Z1231 Encounter for screening mammogram for malignant neoplasm of breast: Secondary | ICD-10-CM

## 2021-11-26 ENCOUNTER — Ambulatory Visit
Admission: RE | Admit: 2021-11-26 | Discharge: 2021-11-26 | Disposition: A | Discharge status: Home / Self Care | Payer: No Typology Code available for payment source | Source: Ambulatory Visit | Attending: Obstetrics and Gynecology | Admitting: Obstetrics and Gynecology

## 2021-11-26 ENCOUNTER — Ambulatory Visit: Payer: Self-pay | Admitting: *Deleted

## 2021-11-26 VITALS — BP 124/84 | Wt 186.7 lb

## 2021-11-26 DIAGNOSIS — Z1239 Encounter for other screening for malignant neoplasm of breast: Secondary | ICD-10-CM

## 2021-11-26 DIAGNOSIS — Z1231 Encounter for screening mammogram for malignant neoplasm of breast: Secondary | ICD-10-CM

## 2021-11-26 NOTE — Patient Instructions (Addendum)
Explained breast self awareness with Griselda Miner. Patient did not need a Pap smear today due to last Pap smear was 07/03/2019. Let her know [facility]CP will cover Pap smears and HPV typing every 5 years unless has a history of abnormal Pap smears. Referred patient to the [facility] for a screening mammogram on mobile unit. Appointment scheduled Thursday, Nov 26, 2021 at 1110. Patient aware of appointment and will be there. Let patient know the Breast Center will follow up with her within the next couple weeks with results of her mammogram by letter or phone. Discussed smoking cessation with patient. Referred patient to the [facility] Quitline. Griselda Miner verbalized understanding. ? ?Alajia Schmelzer, Kathaleen Maser, RN ?10:14 AM ? ? ? ? ?

## 2021-11-26 NOTE — Progress Notes (Addendum)
Ms. Tracy Carson is a 41 y.o. female who presents to [facility] clinic today with complaint of bilateral diffuse breast pain since 2016 that was noted on previous exam 01/15/2016 that a follow up diagnostic mammogram was completed 01/15/2016 that was negative with a screening mammogram recommended in one year. Patient stated the pain comes and goes. Patient states the pain is greater within the left breast. Patient rates the pain at a 3 out of 10.  ?  ?Pap Smear: Pap smear not completed today. Last Pap smear was 07/03/2019 at [facility] and was normal with negative HPV. Per patient has no history of an abnormal Pap smear. Last Pap smear result is available in Epic. ?  ?Physical exam: ?Breasts ?Breasts symmetrical. No skin abnormalities bilateral breasts. No nipple retraction bilateral breasts. No nipple discharge bilateral breasts. No lymphadenopathy. No lumps palpated bilateral breasts. No complaints of pain or tenderness on exam. Patient advised to follow up with her PCP if she continues to have breast pain.    ? ?Pelvic/Bimanual ?Pap is not indicated today per [facility]CP guidelines. ?  ?Smoking History: ?Patient is a current smoker. Discussed smoking cessation with patient. Referred patient to the [facility] Quitline. ?  ?Patient Navigation: ?Patient education provided. Access to services provided for patient through [facility] program. Spanish interpreter Natale Lay from [facility] provided.  ?  ?Breast and Cervical Cancer Risk Assessment: ?Patient does not have family history of breast cancer, known genetic mutations, or radiation treatment to the chest before age 62. Patient does not have history of cervical dysplasia, immunocompromised, or DES exposure in-utero. ? ?Risk Assessment   ? ? Risk Scores   ? ?   11/26/2021  ? Last edited by: Narda Rutherford, LPN  ? 5-year risk: 0.3 %  ? Lifetime risk: 5.7 %  ? ?  ?  ? ?  ? ? ?A: ?BCCCP exam without pap smear ?Complaint of bilateral breast  pain. ? ?P: ?Referred patient to the [facility] for a screening mammogram on mobile unit. Appointment scheduled Thursday, Nov 26, 2021 at 1110. ? ?Priscille Heidelberg, RN ?11/26/2021 10:14 AM   ?

## 2021-11-27 ENCOUNTER — Other Ambulatory Visit: Payer: Self-pay | Admitting: Endocrinology

## 2021-11-27 ENCOUNTER — Other Ambulatory Visit: Payer: Self-pay

## 2021-11-27 MED ORDER — LEVOTHYROXINE SODIUM 125 MCG PO TABS
ORAL_TABLET | Freq: Every day | ORAL | 0 refills | Status: DC
  Filled 2021-11-27: qty 30, 30d supply, fill #0

## 2021-12-01 ENCOUNTER — Other Ambulatory Visit: Payer: Self-pay

## 2022-01-22 ENCOUNTER — Other Ambulatory Visit: Payer: Self-pay | Admitting: Endocrinology

## 2022-01-25 ENCOUNTER — Other Ambulatory Visit: Payer: Self-pay

## 2022-01-28 ENCOUNTER — Telehealth: Payer: Self-pay | Admitting: Endocrinology

## 2022-01-28 ENCOUNTER — Other Ambulatory Visit: Payer: Self-pay | Admitting: Endocrinology

## 2022-01-28 ENCOUNTER — Other Ambulatory Visit: Payer: Self-pay

## 2022-01-28 DIAGNOSIS — E890 Postprocedural hypothyroidism: Secondary | ICD-10-CM

## 2022-01-28 MED ORDER — LEVOTHYROXINE SODIUM 125 MCG PO TABS
ORAL_TABLET | Freq: Every day | ORAL | 0 refills | Status: DC
  Filled 2022-01-28: qty 30, 30d supply, fill #0

## 2022-01-28 NOTE — Telephone Encounter (Signed)
Patient requesting refill of Levothyroxine be sent to [facility] - labs and follow up made per MD

## 2022-01-28 NOTE — Telephone Encounter (Signed)
RX now sent to preferred pharmacy.  

## 2022-01-29 ENCOUNTER — Other Ambulatory Visit: Payer: Self-pay

## 2022-02-02 ENCOUNTER — Ambulatory Visit: Payer: No Typology Code available for payment source | Admitting: Endocrinology

## 2022-02-25 ENCOUNTER — Other Ambulatory Visit (INDEPENDENT_AMBULATORY_CARE_PROVIDER_SITE_OTHER): Payer: Self-pay

## 2022-02-25 DIAGNOSIS — E890 Postprocedural hypothyroidism: Secondary | ICD-10-CM

## 2022-02-25 LAB — TSH: TSH: 3.4 u[IU]/mL (ref 0.35–5.50)

## 2022-03-02 ENCOUNTER — Other Ambulatory Visit: Payer: Self-pay

## 2022-03-02 ENCOUNTER — Encounter: Payer: Self-pay | Admitting: Endocrinology

## 2022-03-02 ENCOUNTER — Ambulatory Visit (INDEPENDENT_AMBULATORY_CARE_PROVIDER_SITE_OTHER): Payer: Self-pay | Admitting: Endocrinology

## 2022-03-02 VITALS — BP 122/78 | HR 77 | Ht 63.0 in | Wt 185.2 lb

## 2022-03-02 DIAGNOSIS — E0500 Thyrotoxicosis with diffuse goiter without thyrotoxic crisis or storm: Secondary | ICD-10-CM

## 2022-03-02 DIAGNOSIS — E890 Postprocedural hypothyroidism: Secondary | ICD-10-CM

## 2022-03-02 MED ORDER — LEVOTHYROXINE SODIUM 125 MCG PO TABS
125.0000 ug | ORAL_TABLET | Freq: Every day | ORAL | 3 refills | Status: DC
  Filled 2022-03-02: qty 90, 90d supply, fill #0
  Filled 2022-07-23 (×2): qty 90, 90d supply, fill #1
  Filled 2022-11-15: qty 90, 90d supply, fill #2

## 2022-03-02 NOTE — Progress Notes (Signed)
Patient ID: Tracy Carson, female   DOB: 06/09/1981, 41 y.o.   MRN: [id number]                                                                                                               Reason for Appointment:  follow-up visit    Chief complaint: Follow-up of thyroid   History of Present Illness:   Prior history: Her hyperthyroidism was diagnosed in 2012 before a pregnancy At that time she had symptoms of palpitations, shakiness, feeling excessively warm, difficulty sleeping, increased appetite and hair loss She was treated with methimazole She was recommended I-131 treatment in 2014 but even though she had an uptake test done she did not go for the treatment Subsequently has been very irregular with her follow-up program and medications She says she was taking her methimazole 2 tablets twice a day every third day or so because of cost Because of worsening symptoms of palpitations, feeling excessively hot and losing weight she presented to the emergency room on 11/08/16 and was admitted for 2 days  On her initial consultation in her free T4 was still very high at 3.04 and she was having problems with fatigue, weakness and heat intolerance along with tachycardia. Her methimazole was increased to 3 tablets twice a day initially   She finally agreed to do that I-131 treatment and this was done with 19.5 mCi on 02/16/17  RECENT history  In October 2018 she had normal thyroid functions and had no complaints but did not follow-up as directed subsequently In June 2020 at the ER she thought she had swollen glands in her neck along with anxiety and palpitations At that time her thyroid levels showed hyperthyroidism and free T4 was significantly high at 2.3 She was started on methimazole 10 mg twice daily by her PCP on 01/11/2019  Because of persistent hyperthyroidism she has been treated with 12 mCi of I-131 on 01/17/2020  She became hypothyroid in 8/21 with significantly low free  T4 However at that time she was asymptomatic  With 100 mcg of levothyroxine her TSH was still about 12 and free T4 lower  She was also complaining of fatigue as well as swelling of her eyes As before is taking 125 mg of LEVOTHYROXINE  Currently she has been taking her levothyroxine very consistently every morning before eating  No recent weight change, her last visit was in 10/2020 No unusual fatigue, lethargy or hair loss  She has periodic swelling of her eyes  She will occasionally have some flashing lights  TSH is now consistently normal at 1.7  Wt Readings from Last 3 Encounters:  03/02/22 185 lb 3.2 oz (84 kg)  11/26/21 186 lb 11.2 oz (84.7 kg)  09/29/21 189 lb 12.8 oz (86.1 kg)    Thyroid function tests as follows:     Lab Results  Component Value Date   FREET4 0.96 06/10/2020   FREET4 0.28 (L) 04/28/2020   FREET4 0.41 (L) 03/03/2020   T3FREE 3.6 10/15/2019   T3FREE 3.2 05/24/2019  T3FREE 4.3 (H) 04/10/2019   TSH 3.40 02/25/2022   TSH 1.68 11/10/2020   TSH 2.99 06/10/2020    No results found for: "THYROTRECAB"   Allergies as of 03/02/2022   No Known Allergies      Medication List        Accurate as of March 02, 2022  1:11 PM. If you have any questions, ask your nurse or doctor.          diclofenac 75 MG EC tablet Commonly known as: VOLTAREN Take 1 tablet (75 mg total) by mouth 2 (two) times daily.   levothyroxine 125 MCG tablet Commonly known as: SYNTHROID Tome 1 tableta por va oral diariamente. (Patient needs appointment for any future refills) (Take 1 tablet by mouth daily. (Patient needs appointment for any future refills))   nicotine 7 mg/24hr patch Commonly known as: Nicoderm CQ Place 1 patch (7 mg total) onto the skin daily.            Past Medical History:  Diagnosis Date   Hypertension    Thyroid disease    hyper   Tuberculosis    as a child    Past Surgical History:  Procedure Laterality Date   CESAREAN SECTION   07/02/2011   Procedure: CESAREAN SECTION;  Surgeon: Roseanna Rainbow, MD;  Location: [facility];  Service: Gynecology;  Laterality: N/A;   CHOLECYSTECTOMY  2002    Family History  Problem Relation Age of Onset   Diabetes Mother    Hypertension Father    Thyroid disease Sister    Thyroid disease Paternal Grandmother    Thyroid disease Brother    Breast cancer Neg Hx     Social History:  reports that she has been smoking cigars and cigarettes. She has a 5.00 pack-year smoking history. She has quit using smokeless tobacco. She reports that she does not drink alcohol and does not use drugs.  Allergies: No Known Allergies   Review of Systems      Examination:   BP 122/78   Pulse 77   Ht 5\' 3"  (1.6 m)   Wt 185 lb 3.2 oz (84 kg)   SpO2 93%   BMI 32.81 kg/m   She has mild proptosis of the right eye and no conjunctival changes Mild swelling of the eyelids Extraocular movements are normal without diplopia   Assessment/Plan:   Graves' disease, treated with radioactive iodine in July 2018 and again in 12/2019  She has had hypothyroidism since 02/2020  She has been taking 125 mcg levothyroxine with no recurrence of hypothyroidism  TSH is consistently normal  She will continue the same dose and follow-up annually  Since she is having more ocular symptoms and some flashing lights she will need to have an ophthalmology consultation, has not been seen before and will refer   There are no Patient Instructions on file for this visit.   Reather Littler 03/02/2022, 1:11 PM    Note: This office note was prepared with Dragon voice recognition system technology. Any transcriptional errors that result from this process are unintentional.

## 2022-03-04 ENCOUNTER — Other Ambulatory Visit: Payer: Self-pay

## 2022-04-24 ENCOUNTER — Other Ambulatory Visit: Payer: Self-pay

## 2022-04-24 ENCOUNTER — Encounter (HOSPITAL_COMMUNITY): Payer: Self-pay | Admitting: Emergency Medicine

## 2022-04-24 ENCOUNTER — Emergency Department (HOSPITAL_COMMUNITY): Payer: No Typology Code available for payment source

## 2022-04-24 ENCOUNTER — Emergency Department (HOSPITAL_COMMUNITY)
Admission: EM | Admit: 2022-04-24 | Discharge: 2022-04-24 | Discharge status: Against Medical Advice | Payer: No Typology Code available for payment source | Attending: Emergency Medicine | Admitting: Emergency Medicine

## 2022-04-24 DIAGNOSIS — R072 Precordial pain: Secondary | ICD-10-CM | POA: Insufficient documentation

## 2022-04-24 DIAGNOSIS — Z79899 Other long term (current) drug therapy: Secondary | ICD-10-CM | POA: Insufficient documentation

## 2022-04-24 DIAGNOSIS — R519 Headache, unspecified: Secondary | ICD-10-CM | POA: Insufficient documentation

## 2022-04-24 DIAGNOSIS — I10 Essential (primary) hypertension: Secondary | ICD-10-CM | POA: Insufficient documentation

## 2022-04-24 DIAGNOSIS — R0602 Shortness of breath: Secondary | ICD-10-CM | POA: Insufficient documentation

## 2022-04-24 LAB — CBC
HCT: 42.2 % (ref 36.0–46.0)
Hemoglobin: 14.9 g/dL (ref 12.0–15.0)
MCH: 33.2 pg (ref 26.0–34.0)
MCHC: 35.3 g/dL (ref 30.0–36.0)
MCV: 94 fL (ref 80.0–100.0)
Platelets: 210 10*3/uL (ref 150–400)
RBC: 4.49 MIL/uL (ref 3.87–5.11)
RDW: 11.9 % (ref 11.5–15.5)
WBC: 7.8 10*3/uL (ref 4.0–10.5)
nRBC: 0 % (ref 0.0–0.2)

## 2022-04-24 LAB — BASIC METABOLIC PANEL
Anion gap: 10 (ref 5–15)
BUN: 5 mg/dL — ABNORMAL LOW (ref 6–20)
CO2: 24 mmol/L (ref 22–32)
Calcium: 9.2 mg/dL (ref 8.9–10.3)
Chloride: 105 mmol/L (ref 98–111)
Creatinine, Ser: 0.68 mg/dL (ref 0.44–1.00)
GFR, Estimated: >60 mL/min (ref >60)
Glucose, Bld: 154 mg/dL — ABNORMAL HIGH (ref 70–99)
Potassium: 3.4 mmol/L — ABNORMAL LOW (ref 3.5–5.1)
Sodium: 139 mmol/L (ref 135–145)

## 2022-04-24 LAB — TROPONIN I (HIGH SENSITIVITY)
Troponin I (High Sensitivity): 3 ng/L (ref <18)
Troponin I (High Sensitivity): 3 ng/L (ref <18)

## 2022-04-24 LAB — I-STAT BETA HCG BLOOD, ED (MC, WL, AP ONLY): I-stat hCG, quantitative: <5 m[IU]/mL (ref <5)

## 2022-04-24 NOTE — ED Provider Notes (Signed)
[facility] EMERGENCY DEPARTMENT Provider Note   CSN: [id number] Arrival date & time: 04/24/22  0222     History  Chief Complaint  Patient presents with   Chest Pain    Tracy Carson is a 41 y.o. female.  Patient with a complaint of chest pain shortness of breath headache hypertension headache is now resolved blood pressures are now back to normal.  Chest pain has been constant substernal area.  Past medical history significant for thyroid disease and history of hypertension but not on any medications patient is a tobacco user.  Patient seen in the urgent care February 1 for chest pain unspecified type.  Patient denies any fevers or upper respiratory symptoms.       Home Medications Prior to Admission medications   Medication Sig Start Date End Date Taking? Authorizing Provider  diclofenac (VOLTAREN) 75 MG EC tablet Take 1 tablet (75 mg total) by mouth 2 (two) times daily. 08/26/21   Mardella Layman, MD  levothyroxine (SYNTHROID) 125 MCG tablet Take 1 tablet (125 mcg total) by mouth daily before breakfast. 03/02/22 03/02/23  Reather Littler, MD  nicotine (NICODERM CQ) 7 mg/24hr patch Place 1 patch (7 mg total) onto the skin daily. Patient not taking: Reported on 09/29/2021 06/04/20   [name], MD      Allergies    Patient has no known allergies.    Review of Systems   Review of Systems  Constitutional:  Negative for chills and fever.  HENT:  Negative for ear pain and sore throat.   Eyes:  Negative for pain and visual disturbance.  Respiratory:  Positive for shortness of breath. Negative for cough.   Cardiovascular:  Positive for chest pain. Negative for palpitations.  Gastrointestinal:  Negative for abdominal pain and vomiting.  Genitourinary:  Negative for dysuria and hematuria.  Musculoskeletal:  Negative for arthralgias and back pain.  Skin:  Negative for color change and rash.  Neurological:  Positive for headaches. Negative for seizures and syncope.   All other systems reviewed and are negative.   Physical Exam Updated Vital Signs BP 122/72   Pulse (!) 50   Temp (!) 97.5 F (36.4 C) (Oral)   Resp 14   Ht 1.6 m (5\' 3" )   Wt 84 kg   SpO2 99%   BMI 32.80 kg/m  Physical Exam Vitals and nursing note reviewed.  Constitutional:      General: She is not in acute distress.    Appearance: She is well-developed. She is not ill-appearing.  HENT:     Head: Normocephalic and atraumatic.  Eyes:     Conjunctiva/sclera: Conjunctivae normal.  Cardiovascular:     Rate and Rhythm: Normal rate and regular rhythm.     Heart sounds: No murmur heard. Pulmonary:     Effort: Pulmonary effort is normal. No respiratory distress.     Breath sounds: Normal breath sounds. No decreased breath sounds, wheezing, rhonchi or rales.  Chest:     Chest wall: No tenderness.  Abdominal:     Palpations: Abdomen is soft.     Tenderness: There is no abdominal tenderness.  Musculoskeletal:        General: No swelling.     Cervical back: Neck supple.     Right lower leg: No tenderness. No edema.     Left lower leg: No tenderness. No edema.  Skin:    General: Skin is warm and dry.     Capillary Refill: Capillary refill takes less than  2 seconds.  Neurological:     Mental Status: She is alert.  Psychiatric:        Mood and Affect: Mood normal.     ED Results / Procedures / Treatments   Labs (all labs ordered are listed, but only abnormal results are displayed) Labs Reviewed  BASIC METABOLIC PANEL - Abnormal; Notable for the following components:      Result Value   Potassium 3.4 (*)    Glucose, Bld 154 (*)    BUN 5 (*)    All other components within normal limits  CBC  I-STAT BETA HCG BLOOD, ED ([facility], [facility], [facility] ONLY)  TROPONIN I (HIGH SENSITIVITY)  TROPONIN I (HIGH SENSITIVITY)    EKG EKG Interpretation  Date/Time:  Saturday April 24 2022 02:36:32 EDT Ventricular Rate:  78 PR Interval:  146 QRS Duration: 92 QT Interval:  402 QTC  Calculation: 458 R Axis:   93 Text Interpretation: Normal sinus rhythm Anterolateral infarct , age undetermined Abnormal ECG When compared with ECG of 26-Aug-2021 17:55, PREVIOUS ECG IS PRESENT Confirmed by Fredia Sorrow [phone]) on 04/24/2022 2:39:37 PM  Radiology DG Chest 2 View  Result Date: 04/24/2022 CLINICAL DATA:  Chest pain and shortness of breath EXAM: CHEST - 2 VIEW COMPARISON:  Radiographs 08/26/2021 FINDINGS: No focal consolidation, pleural effusion, or pneumothorax. Normal cardiomediastinal silhouette. No acute osseous abnormality. IMPRESSION: No active cardiopulmonary disease. Electronically Signed   By: Placido Sou M.D.   On: 04/24/2022 03:10    Procedures Procedures    Medications Ordered in ED Medications - No data to display  ED Course/ Medical Decision Making/ A&P                           Medical Decision Making Amount and/or Complexity of Data Reviewed Labs: ordered. Radiology: ordered.   Patient blood pressure now is 814 systolic.  Patient's work-up troponins x2 without any acute abnormalities in the troponins basic metabolic panel only significant for some mild hypokalemia with a potassium of 3.4.  CBC White blood cell count 7.8 hemoglobin good at 14.9.  Chest x-ray negative pregnancy test negative  Patient stable for discharge home will give referral to follow-up with cardiology and her primary care doctor.  May be borderline hypertensive but no evidence of any acute blood pressure at this point in time.  But needs to have a trended.  Recommend follow-up with cardiology for further work-up of the chest pain patient also seen in February for chest pain.  Consideration for echocardiogram and nonexercise stress test.  We will recommend patient starting a baby aspirin a day. Final Clinical Impression(s) / ED Diagnoses Final diagnoses:  Precordial pain    Rx / DC Orders ED Discharge Orders     None         Fredia Sorrow, MD 04/24/22 [phone]

## 2022-04-24 NOTE — ED Triage Notes (Signed)
Pt c/o chest pain, shortness of breath, headache and hypertension. Headache started today. A&Ox4, ambulatory without difficulty.

## 2022-04-24 NOTE — ED Notes (Signed)
This NT got p's vitals, earlier this morning and pt is no longer in ED.

## 2022-04-24 NOTE — Discharge Instructions (Signed)
Work-up today for the chest pain without any acute findings.  Make an appointment to follow-up with cardiology.  Recommend taking a baby aspirin a day.  Can also follow-up with your doctor for follow-up of your blood pressure.  Blood pressure currently normal but you could be borderline hypertensive needing treatment.  They will trend that and see if you need treatment.  Return for any new or worse symptoms.

## 2022-04-27 ENCOUNTER — Ambulatory Visit: Payer: Self-pay

## 2022-04-27 NOTE — Telephone Encounter (Signed)
    Chief Complaint: Left side chest pain Symptoms: pain that radiates into neck and head.Had sweating earlier. Frequency: This morning, worse "than when I went to ED 03/28/22." Pertinent Negatives: Patient denies SOB Disposition: [x] ED /[] Urgent Care (no appt availability in office) / [] Appointment(In office/virtual)/ []  [facility]/ [] Home Care/ [] Refused Recommended Disposition /[] [facility]/ [x]  Follow-up with PCP Additional Notes: Pt. Will need follow appointment.  Reason for Disposition  Pain also in shoulder(s) or arm(s) or jaw  (Exception: Pain is clearly made worse by movement.)  Answer Assessment - Initial Assessment Questions 1. LOCATION: "Where does it hurt?"       Left side  2. RADIATION: "Does the pain go anywhere else?" (e.g., into neck, jaw, arms, back)     Neck and head 3. ONSET: "When did the chest pain begin?" (Minutes, hours or days)      30 minutes ago 4. PATTERN: "Does the pain come and go, or has it been constant since it started?"  "Does it get worse with exertion?"      Comes and goes 5. DURATION: "How long does it last" (e.g., seconds, minutes, hours)     Minutes 6. SEVERITY: "How bad is the pain?"  (e.g., Scale 1-10; mild, moderate, or severe)    - MILD (1-3): doesn't interfere with normal activities     - MODERATE (4-7): interferes with normal activities or awakens from sleep    - SEVERE (8-10): excruciating pain, unable to do any normal activities       Now - 6-7 7. CARDIAC RISK FACTORS: "Do you have any history of heart problems or risk factors for heart disease?" (e.g., angina, prior heart attack; diabetes, high blood pressure, high cholesterol, smoker, or strong family history of heart disease)     No 8. PULMONARY RISK FACTORS: "Do you have any history of lung disease?"  (e.g., blood clots in lung, asthma, emphysema, birth control pills)     No 9. CAUSE: "What do you think is causing the chest pain?"     Unsure 10. OTHER  SYMPTOMS: "Do you have any other symptoms?" (e.g., dizziness, nausea, vomiting, sweating, fever, difficulty breathing, cough)       Sweating 11. PREGNANCY: "Is there any chance you are pregnant?" "When was your last menstrual period?"       No  Protocols used: Chest Pain-A-AH

## 2022-04-29 NOTE — Telephone Encounter (Signed)
Pt  has an app scheduled and will go to ED if pain returns.

## 2022-05-16 NOTE — Progress Notes (Deleted)
Cardiology Office Note:    Date:  05/16/2022   ID:  Tracy Carson, DOB November 08, 1980, MRN [id number]  PCP:  [name], MD   [facility] Providers Cardiologist:  None {  Referring MD: [name], MD   History of Present Illness:    Tracy Carson is a 41 y.o. female with a hx of HTN and history of TB as a child who was referred by Dr. Alvis Lemmings for further evaluation of chest pain.  Patient seen in [facility] ER on 03/2022 for chest pain. Note reviewed. In the ER, trop negatove x2. CXR without acute pathology. ECG with NSR without acute ischemic changes. She was discharged home with CV follow-up.  Today, ***  Past Medical History:  Diagnosis Date   Hypertension    Thyroid disease    hyper   Tuberculosis    as a child    Past Surgical History:  Procedure Laterality Date   CESAREAN SECTION  07/02/2011   Procedure: CESAREAN SECTION;  Surgeon: Roseanna Rainbow, MD;  Location: [facility];  Service: Gynecology;  Laterality: N/A;   CHOLECYSTECTOMY  2002    Current Medications: No outpatient medications have been marked as taking for the 05/18/22 encounter (Appointment) with Meriam Sprague, MD.     Allergies:   Patient has no known allergies.   Social History   Socioeconomic History   Marital status: Married    Spouse name: Blossom Hoops   Number of children: 4   Years of education: 7   Highest education level: 7th grade  Occupational History    Comment: na  Tobacco Use   Smoking status: Some Days    Packs/day: 5.00    Years: 1.00    Total pack years: 5.00    Types: Cigars, Cigarettes   Smokeless tobacco: Former   Tobacco comments:    03/28/19 7-8/day  Vaping Use   Vaping Use: Never used  Substance and Sexual Activity   Alcohol use: No    Comment: quit 10/2018   Drug use: No   Sexual activity: Yes    Birth control/protection: None  Other Topics Concern   Not on file  Social History Narrative   Lives with spouse, 4 daughters   Some caffeine    Social Determinants of Health   Financial Resource Strain: Not on file  Food Insecurity: No Food Insecurity (11/26/2021)   Hunger Vital Sign    Worried About Running Out of Food in the Last Year: Never true    Ran Out of Food in the Last Year: Never true  Transportation Needs: No Transportation Needs (11/26/2021)   PRAPARE - Administrator, Civil Service (Medical): No    Lack of Transportation (Non-Medical): No  Physical Activity: Not on file  Stress: Not on file  Social Connections: Not on file     Family History: The patient's ***family history includes Diabetes in her mother; Hypertension in her father; Thyroid disease in her brother, paternal grandmother, and sister. There is no history of Breast cancer.  ROS:   Please see the history of present illness.    *** All other systems reviewed and are negative.  EKGs/Labs/Other Studies Reviewed:    The following studies were reviewed today: ***  EKG:  EKG is *** ordered today.  The ekg ordered today demonstrates ***  Recent Labs: 02/25/2022: TSH 3.40 04/24/2022: BUN 5; Creatinine, Ser 0.68; Hemoglobin 14.9; Platelets 210; Potassium 3.4; Sodium 139  Recent Lipid Panel    Component Value  Date/Time   CHOL 112 11/15/2016 1038   TRIG 162 (H) 11/15/2016 1038   HDL 24 (L) 11/15/2016 1038   CHOLHDL 4.7 (H) 11/15/2016 1038   LDLCALC 56 11/15/2016 1038     Risk Assessment/Calculations:   {Does this patient have ATRIAL FIBRILLATION?:[phone]}  No BP recorded.  {Refresh Note OR Click here to enter BP  :1}***         Physical Exam:    VS:  There were no vitals taken for this visit.    Wt Readings from Last 3 Encounters:  04/24/22 185 lb 3 oz (84 kg)  03/02/22 185 lb 3.2 oz (84 kg)  11/26/21 186 lb 11.2 oz (84.7 kg)     GEN: *** Well nourished, well developed in no acute distress HEENT: Normal NECK: No JVD; No carotid bruits LYMPHATICS: No lymphadenopathy CARDIAC: ***RRR, no murmurs, rubs,  gallops RESPIRATORY:  Clear to auscultation without rales, wheezing or rhonchi  ABDOMEN: Soft, non-tender, non-distended MUSCULOSKELETAL:  No edema; No deformity  SKIN: Warm and dry NEUROLOGIC:  Alert and oriented x 3 PSYCHIATRIC:  Normal affect   ASSESSMENT:    No diagnosis found. PLAN:    In order of problems listed above:  #Chest Pain: Reassuring work-up in the ER.  -Check TTE?? -Check coronary CTA ???      {Are you ordering a CV Procedure (e.g. stress test, cath, DCCV, TEE, etc)?   Press F2        :[id number]}    Medication Adjustments/Labs and Tests Ordered: Current medicines are reviewed at length with the patient today.  Concerns regarding medicines are outlined above.  No orders of the defined types were placed in this encounter.  No orders of the defined types were placed in this encounter.   There are no Patient Instructions on file for this visit.   Signed, Meriam Sprague, MD  05/16/2022 7:59 PM    [facility]

## 2022-05-18 ENCOUNTER — Ambulatory Visit: Payer: No Typology Code available for payment source | Admitting: Cardiology

## 2022-06-14 ENCOUNTER — Ambulatory Visit: Payer: No Typology Code available for payment source | Admitting: Family Medicine

## 2022-07-23 ENCOUNTER — Other Ambulatory Visit: Payer: Self-pay

## 2022-08-09 ENCOUNTER — Ambulatory Visit (INDEPENDENT_AMBULATORY_CARE_PROVIDER_SITE_OTHER): Payer: No Typology Code available for payment source

## 2022-08-09 ENCOUNTER — Other Ambulatory Visit: Payer: Self-pay

## 2022-08-09 ENCOUNTER — Ambulatory Visit (HOSPITAL_COMMUNITY)
Admission: EM | Admit: 2022-08-09 | Discharge: 2022-08-09 | Disposition: A | Discharge status: Home / Self Care | Payer: No Typology Code available for payment source | Attending: Emergency Medicine | Admitting: Emergency Medicine

## 2022-08-09 ENCOUNTER — Encounter (HOSPITAL_COMMUNITY): Payer: Self-pay | Admitting: Emergency Medicine

## 2022-08-09 DIAGNOSIS — M545 Low back pain, unspecified: Secondary | ICD-10-CM

## 2022-08-09 LAB — POCT URINALYSIS DIPSTICK, ED / UC
Bilirubin Urine: NEGATIVE
Glucose, UA: NEGATIVE mg/dL
Ketones, ur: NEGATIVE mg/dL
Leukocytes,Ua: NEGATIVE
Nitrite: NEGATIVE
Protein, ur: 30 mg/dL — AB
Specific Gravity, Urine: 1.02 (ref 1.005–1.030)
Urobilinogen, UA: 0.2 mg/dL (ref 0.0–1.0)
pH: 6 (ref 5.0–8.0)

## 2022-08-09 LAB — POC URINE PREG, ED: Preg Test, Ur: NEGATIVE

## 2022-08-09 MED ORDER — PREDNISONE 10 MG PO TABS
ORAL_TABLET | ORAL | 0 refills | Status: AC
  Filled 2022-08-09 (×2): qty 42, 12d supply, fill #0

## 2022-08-09 MED ORDER — METHOCARBAMOL 500 MG PO TABS
500.0000 mg | ORAL_TABLET | Freq: Two times a day (BID) | ORAL | 0 refills | Status: DC
  Filled 2022-08-09: qty 20, 10d supply, fill #0

## 2022-08-09 MED ORDER — KETOROLAC TROMETHAMINE 60 MG/2ML IM SOLN
INTRAMUSCULAR | Status: AC
  Filled 2022-08-09: qty 2

## 2022-08-09 MED ORDER — IBUPROFEN 800 MG PO TABS
800.0000 mg | ORAL_TABLET | Freq: Three times a day (TID) | ORAL | 0 refills | Status: DC
  Filled 2022-08-09: qty 21, 7d supply, fill #0

## 2022-08-09 MED ORDER — KETOROLAC TROMETHAMINE 30 MG/ML IJ SOLN
60.0000 mg | Freq: Once | INTRAMUSCULAR | Status: AC
  Administered 2022-08-09: 60 mg via INTRAMUSCULAR

## 2022-08-09 NOTE — Discharge Instructions (Signed)
X-ray is normal today Your urine shows a small amount of blood but besides that no infection noted You will need to follow-up with orthopedic if pain persist after 1 week Use warm compresses and NSAIDs as needed for pain Do not drive while taking medication this may make you sleepy

## 2022-08-09 NOTE — ED Provider Notes (Addendum)
[place]    CSN: [id number] Arrival date & time: 08/09/22  0802      History   Chief Complaint Chief Complaint  Patient presents with   Back Pain    HPI Tracy Carson is a 42 y.o. female.   Patient presents today with lower back pain that radiates around the flank week.  Patient states that she is unknown if any injury. has been taking Tylenol and Motrin minimal relief.  Denies any vaginal discharge.  Last menstrual period was normal 2 weeks ago.  Last bowel movement was yesterday and normal denies any blood noted.    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   History of blurry vision 05/20/2014   History of itching of eye 05/20/2014   Other fatigue 05/20/2014   Tobacco dependence 05/20/2014   Palpitations 05/20/2014   Abdominal pain 11/03/2012   Proctitis 11/03/2012   Tachycardia 11/03/2012   Hematochezia 11/03/2012   Graves disease 11/03/2012   Anemia 11/03/2012   Breech presentation 07/02/2011   Cesarean delivery, without mention of indication, delivered, with or without mention of antepartum condition 07/02/2011    Past Surgical History:  Procedure Laterality Date   CESAREAN SECTION  07/02/2011   Procedure: CESAREAN SECTION;  Surgeon: Agnes Lawrence, MD;  Location: [facility];  Service: Gynecology;  Laterality: N/A;   CHOLECYSTECTOMY  2002    OB History     Gravida  4   Para  4   Term  4   Preterm      AB      Living  4      SAB      IAB      Ectopic      Multiple      Live Births  3            Home Medications    Prior to Admission medications   Medication Sig Start Date End Date Taking? Authorizing Provider  ibuprofen (ADVIL) 800 MG tablet Take 1 tablet  (800 mg total) by mouth 3 (three) times daily. 08/09/22  Yes Marney Setting, NP  methocarbamol (ROBAXIN) 500 MG tablet Take 1 tablet (500 mg total) by mouth 2 (two) times daily. 08/09/22  Yes Marney Setting, NP  predniSONE (STERAPRED UNI-PAK 21 TAB) 10 MG (21) TBPK tablet Take 6 tablets (60 mg total) by mouth daily for 2 days, THEN 5 tablets (50 mg total) daily for 2 days, THEN 4 tablets (40 mg total) daily for 2 days, THEN 3 tablets (30 mg total) daily for 2 days, THEN 2 tablets (20 mg total) daily for 2 days, THEN 1 tablet (10 mg total) daily for 2 days. Take 6 tabs by mouth daily  for 2 days, then 5 tabs for 2 days, then 4 tabs for 2 days, then 3 tabs for 2 days, 2 tabs for 2 days, then 1 tab by mouth daily for 2 days. 08/09/22 08/21/22 Yes Marney Setting, NP  levothyroxine (SYNTHROID) 125 MCG tablet Take 1 tablet (125 mcg total) by mouth daily before breakfast. 03/02/22 03/02/23  Elayne Snare, MD  nicotine (NICODERM CQ) 7 mg/24hr patch  Place 1 patch (7 mg total) onto the skin daily. Patient not taking: Reported on 09/29/2021 06/04/20   [name], MD    Family History Family History  Problem Relation Age of Onset   Diabetes Mother    Hypertension Father    Thyroid disease Sister    Thyroid disease Paternal Grandmother    Thyroid disease Brother    Breast cancer Neg Hx     Social History Social History   Tobacco Use   Smoking status: Some Days    Packs/day: 5.00    Years: 1.00    Total pack years: 5.00    Types: Cigars, Cigarettes   Smokeless tobacco: Former   Tobacco comments:    03/28/19 7-8/day  Vaping Use   Vaping Use: Never used  Substance Use Topics   Alcohol use: No    Comment: quit 10/2018   Drug use: No     Allergies   Patient has no known allergies.   Review of Systems Review of Systems  Constitutional: Negative.   Respiratory: Negative.    Cardiovascular: Negative.   Gastrointestinal:        Slight flank pain  Genitourinary: Negative.    Musculoskeletal:  Positive for back pain.  Neurological:        Some numbness and tingling to the lower back and legs intermittently     Physical Exam Triage Vital Signs ED Triage Vitals [08/09/22 0820]  Enc Vitals Group     BP 135/85     Pulse Rate 73     Resp 16     Temp 98 F (36.7 C)     Temp Source Oral     SpO2 97 %     Weight      Height      Head Circumference      Peak Flow      Pain Score      Pain Loc      Pain Edu?      Excl. in GC?    No data found.  Updated Vital Signs BP 135/85 (BP Location: Left Arm)   Pulse 73   Temp 98 F (36.7 C) (Oral)   Resp 16   LMP 07/26/2022   SpO2 97%   Visual Acuity Right Eye Distance:   Left Eye Distance:   Bilateral Distance:    Right Eye Near:   Left Eye Near:    Bilateral Near:     Physical Exam Constitutional:      Appearance: Normal appearance.  Cardiovascular:     Rate and Rhythm: Normal rate.     Pulses: Normal pulses.  Pulmonary:     Effort: Pulmonary effort is normal.  Abdominal:     General: Abdomen is flat.     Tenderness: There is no right CVA tenderness or left CVA tenderness.  Musculoskeletal:        General: Tenderness present. No signs of injury.     Right lower leg: No edema.     Left lower leg: No edema.     Comments: Pain to palpation is more midline radiates bilateral lateral to the left and right.  Full range of motion with tenderness.  Skin:    General: Skin is warm.  Neurological:     General: No focal deficit present.     Mental Status: She is alert. Mental status is at baseline.      UC Treatments / Results  Labs (all labs ordered are listed, but only abnormal results  are displayed) Labs Reviewed  POCT URINALYSIS DIPSTICK, ED / UC - Abnormal; Notable for the following components:      Result Value   Hgb urine dipstick TRACE (*)    Protein, ur 30 (*)    All other components within normal limits  POC URINE PREG, ED    EKG   Radiology DG Cervical Spine 2-3  Views  Result Date: 08/09/2022 CLINICAL DATA:  Low back pain that radiates up spine EXAM: CERVICAL SPINE - 2-3 VIEW COMPARISON:  None Available. FINDINGS: There is straightening of normal cervical lordosis. No signs of acute posttraumatic malalignment of the cervical spine. The vertebral body heights and disc spaces are well preserved. No signs of fracture or subluxation. IMPRESSION: No acute findings. Electronically Signed   By: Kerby Moors M.D.   On: 08/09/2022 09:12    Procedures Procedures (including critical care time)  Medications Ordered in UC Medications  ketorolac (TORADOL) 30 MG/ML injection 60 mg (60 mg Intramuscular Given 08/09/22 0959)    Initial Impression / Assessment and Plan / UC Course  I have reviewed the triage vital signs and the nursing notes.  Pertinent labs & imaging results that were available during my care of the patient were reviewed by me and considered in my medical decision making (see chart for details).      X-ray is normal today Your urine shows a small amount of blood but besides that no infection noted You will need to follow-up with orthopedic if pain persist after 1 week Use warm compresses and NSAIDs as needed for pain Do not drive while taking medication this may make you sleepy  Final diagnoses:  Acute midline low back pain without sciatica     Discharge Instructions      X-ray is normal today Your urine shows a small amount of blood but besides that no infection noted You will need to follow-up with orthopedic if pain persist after 1 week Use warm compresses and NSAIDs as needed for pain Do not drive while taking medication this may make you sleepy     ED Prescriptions     Medication Sig Dispense Auth. Provider   methocarbamol (ROBAXIN) 500 MG tablet Take 1 tablet (500 mg total) by mouth 2 (two) times daily. 20 tablet Morley Kos L, NP   ibuprofen (ADVIL) 800 MG tablet Take 1 tablet (800 mg total) by mouth 3 (three)  times daily. 21 tablet Morley Kos L, NP   predniSONE (STERAPRED UNI-PAK 21 TAB) 10 MG (21) TBPK tablet Take 6 tablets (60 mg total) by mouth daily for 2 days, THEN 5 tablets (50 mg total) daily for 2 days, THEN 4 tablets (40 mg total) daily for 2 days, THEN 3 tablets (30 mg total) daily for 2 days, THEN 2 tablets (20 mg total) daily for 2 days, THEN 1 tablet (10 mg total) daily for 2 days. Take 6 tabs by mouth daily  for 2 days, then 5 tabs for 2 days, then 4 tabs for 2 days, then 3 tabs for 2 days, 2 tabs for 2 days, then 1 tab by mouth daily for 2 days. 42 tablet Marney Setting, NP      PDMP not reviewed this encounter.   Marney Setting, NP 08/09/22 0946    Marney Setting, NP 08/09/22 1003

## 2022-08-09 NOTE — ED Triage Notes (Addendum)
Low back pain that radiates up spine, around abdomen, and into bilateral upper thighs bilaterally over one week. Has been taking diclofenac, ibuprofen, and tylenol without much improvement. Denies injury to back, heavy lifting. States when she has a full bladder her abdomen hurts, but it does not hurt to urinate. Reports some nausea, also reports new numbness/tingling in feet bilaterally when walking. Denies fever, vomiting, diarrhea, vaginal itching/discharge. Last normal bowel movement yesterday. Pain worse when moving/walking. Antalgic gait present on walk to triage, reports the need to limp when walking due to the pain.

## 2022-08-18 ENCOUNTER — Emergency Department (HOSPITAL_COMMUNITY)
Admission: EM | Admit: 2022-08-18 | Discharge: 2022-08-19 | Disposition: A | Discharge status: Home / Self Care | Payer: No Typology Code available for payment source | Attending: Emergency Medicine | Admitting: Emergency Medicine

## 2022-08-18 ENCOUNTER — Encounter (HOSPITAL_COMMUNITY): Payer: Self-pay | Admitting: Emergency Medicine

## 2022-08-18 ENCOUNTER — Other Ambulatory Visit: Payer: Self-pay

## 2022-08-18 ENCOUNTER — Encounter (HOSPITAL_COMMUNITY): Payer: Self-pay | Admitting: *Deleted

## 2022-08-18 ENCOUNTER — Ambulatory Visit (HOSPITAL_COMMUNITY)
Admission: EM | Admit: 2022-08-18 | Discharge: 2022-08-18 | Disposition: A | Discharge status: Home / Self Care | Payer: No Typology Code available for payment source | Attending: Internal Medicine | Admitting: Internal Medicine

## 2022-08-18 ENCOUNTER — Emergency Department (HOSPITAL_COMMUNITY): Payer: No Typology Code available for payment source

## 2022-08-18 DIAGNOSIS — R0789 Other chest pain: Secondary | ICD-10-CM | POA: Insufficient documentation

## 2022-08-18 DIAGNOSIS — I10 Essential (primary) hypertension: Secondary | ICD-10-CM | POA: Insufficient documentation

## 2022-08-18 DIAGNOSIS — G5602 Carpal tunnel syndrome, left upper limb: Secondary | ICD-10-CM | POA: Insufficient documentation

## 2022-08-18 DIAGNOSIS — R9431 Abnormal electrocardiogram [ECG] [EKG]: Secondary | ICD-10-CM | POA: Insufficient documentation

## 2022-08-18 DIAGNOSIS — F17290 Nicotine dependence, other tobacco product, uncomplicated: Secondary | ICD-10-CM | POA: Insufficient documentation

## 2022-08-18 DIAGNOSIS — R079 Chest pain, unspecified: Secondary | ICD-10-CM

## 2022-08-18 DIAGNOSIS — E039 Hypothyroidism, unspecified: Secondary | ICD-10-CM | POA: Insufficient documentation

## 2022-08-18 DIAGNOSIS — Z79899 Other long term (current) drug therapy: Secondary | ICD-10-CM | POA: Insufficient documentation

## 2022-08-18 LAB — CBC
HCT: 41.1 % (ref 36.0–46.0)
Hemoglobin: 14.7 g/dL (ref 12.0–15.0)
MCH: 33.7 pg (ref 26.0–34.0)
MCHC: 35.8 g/dL (ref 30.0–36.0)
MCV: 94.3 fL (ref 80.0–100.0)
Platelets: 219 10*3/uL (ref 150–400)
RBC: 4.36 MIL/uL (ref 3.87–5.11)
RDW: 12 % (ref 11.5–15.5)
WBC: 11.4 10*3/uL — ABNORMAL HIGH (ref 4.0–10.5)
nRBC: 0 % (ref 0.0–0.2)

## 2022-08-18 LAB — BASIC METABOLIC PANEL
Anion gap: 10 (ref 5–15)
BUN: 8 mg/dL (ref 6–20)
CO2: 24 mmol/L (ref 22–32)
Calcium: 8.5 mg/dL — ABNORMAL LOW (ref 8.9–10.3)
Chloride: 101 mmol/L (ref 98–111)
Creatinine, Ser: 0.65 mg/dL (ref 0.44–1.00)
GFR, Estimated: >60 mL/min (ref >60)
Glucose, Bld: 102 mg/dL — ABNORMAL HIGH (ref 70–99)
Potassium: 3.5 mmol/L (ref 3.5–5.1)
Sodium: 135 mmol/L (ref 135–145)

## 2022-08-18 LAB — I-STAT BETA HCG BLOOD, ED (MC, WL, AP ONLY): I-stat hCG, quantitative: <5 m[IU]/mL (ref <5)

## 2022-08-18 LAB — TROPONIN I (HIGH SENSITIVITY): Troponin I (High Sensitivity): 4 ng/L (ref <18)

## 2022-08-18 NOTE — ED Triage Notes (Signed)
The pt is c/o some chest pain with some lt arm numbness all day  she had a headache earlier which is gone now.  She had difficulty naking a fist earlier today but now that is gone     lmp 3 weeks ago

## 2022-08-18 NOTE — ED Notes (Signed)
Patient is being discharged from the Urgent Care and sent to the Emergency Department via POV/husband . Per Dr Windy Carina, patient is in need of higher level of care due to chest pain and left arm numbness. Patient is aware and verbalizes understanding of plan of care.  Vitals:   08/18/22 1930  BP: 124/80  Pulse: 73  Resp: 20  Temp: 98 F (36.7 C)  SpO2: 97%

## 2022-08-18 NOTE — ED Provider Notes (Signed)
Patient seen briefly in triage.  Patient comes in today after some central chest pain.  It was not severe.  Then her left arm hurt and she could not make a fist.  She also felt warm and may be nauseated around this time.  Now she is also having some headache.  Vital signs here are reassuring.  EKG does not show any acute pathology, but it does show some ST nonspecific abnormalities.  With her symptom complex that she is having, I feel that she is best served to be seen in the emergency room for higher level of evaluation and treatment then we can provide in urgent care clinic setting.  She is going to go by private car to the emergency room   Barrett Henle, MD 08/18/22 1946

## 2022-08-18 NOTE — ED Provider Triage Note (Signed)
Emergency Medicine Provider Triage Evaluation Note  Tracy Carson , a 42 y.o. female  was evaluated in triage.  Pt complains of chest pain.  It is constant, associate with nausea and radiation to the left arm.  No loss of consciousness, no previous ACS or PE.  Review of Systems  Per HPI  Physical Exam  BP 116/71 (BP Location: Right Arm)   Pulse 64   Temp 98.4 F (36.9 C)   Resp 20   Ht 5\' 3"  (1.6 m)   Wt 84 kg   LMP 07/26/2022   SpO2 94%   BMI 32.80 kg/m  Gen:   Awake, no distress   Resp:  Normal effort  MSK:   Moves extremities without difficulty  Other:    Medical Decision Making  Medically screening exam initiated at 10:03 PM.  Appropriate orders placed.  Naveen Lorusso was informed that the remainder of the evaluation will be completed by another provider, this initial triage assessment does not replace that evaluation, and the importance of remaining in the ED until their evaluation is complete.     Sherrill Raring, PA-C 08/18/22 2206

## 2022-08-18 NOTE — ED Triage Notes (Signed)
While walking, started to feel hot, left hand numbness, patient describes feel unable to make fist with left hand, but hand was moving, hand felt swollen.  Feels pain in chest, little pain currently and is described as pinching.  Denies sob.  Reports nausea, no vomiting.  Patient did feel hot, patient reports sweating.  Pain in head touching forehead and left side of head.  Head pain is not the worst she has ever had

## 2022-08-18 NOTE — ED Notes (Signed)
Dr Windy Carina assessed patient in treatment room

## 2022-08-19 LAB — TROPONIN I (HIGH SENSITIVITY): Troponin I (High Sensitivity): 3 ng/L (ref <18)

## 2022-08-19 MED ORDER — ACETAMINOPHEN 500 MG PO TABS
1000.0000 mg | ORAL_TABLET | Freq: Once | ORAL | Status: AC
  Administered 2022-08-19: 1000 mg via ORAL
  Filled 2022-08-19: qty 2

## 2022-08-19 NOTE — ED Provider Notes (Signed)
[place] [facility] AT [facility] Provider Note  CSN: [id number] Arrival date & time: 08/18/22 2002  Chief Complaint(s) Chest Pain  HPI Tracy Carson is a 42 y.o. female with history of hypertension, acquired hypothyroidism presenting to the emergency department with chest pain.  Patient reports chest pain for the past few days.  She reports the pain is mild.  She also reports some left hand numbness and sensation of swelling.  No nausea or vomiting, diaphoresis.  She reports dry cough with no productive cough.  No peripheral edema, leg pain or leg swelling.  She denies any recent travel or surgeries.  Pain is not pleuritic or exertional.  Initially went to urgent care and was sent to the emergency department for further evaluation.   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 Authorizing Provider  ibuprofen (ADVIL) 800 MG tablet Take 1 tablet (800 mg total) by mouth 3 (three) times daily. 08/09/22   Coralyn Mark, NP  levothyroxine (SYNTHROID) 125 MCG tablet Take 1 tablet (125 mcg total) by mouth daily before breakfast.  03/02/22 03/02/23  Reather Littler, MD  methocarbamol (ROBAXIN) 500 MG tablet Take 1 tablet (500 mg total) by mouth 2 (two) times daily. 08/09/22   Coralyn Mark, NP  nicotine (NICODERM CQ) 7 mg/24hr patch Place 1 patch (7 mg total) onto the skin daily. Patient not taking: Reported on 09/29/2021 06/04/20   [name], MD  predniSONE (DELTASONE) 10 MG tablet Take 6 tablets (60 mg total) by mouth daily for 2 days, THEN 5 tablets (50 mg total) daily for 2 days, THEN 4 tablets (40 mg total) daily for 2 days, THEN 3 tablets (30 mg total) daily for 2 days, THEN 2 tablets (20 mg total) daily for 2 days, THEN 1 tablet (10 mg total) daily for 2 days. 08/09/22 08/21/22  Coralyn Mark, NP  Past Surgical History Past Surgical History:  Procedure Laterality Date   CESAREAN SECTION  07/02/2011   Procedure: CESAREAN SECTION;  Surgeon: Agnes Lawrence, MD;  Location: [facility];  Service: Gynecology;  Laterality: N/A;   CHOLECYSTECTOMY  2002   Family History Family History  Problem Relation Age of Onset   Diabetes Mother    Hypertension Father    Thyroid disease Sister    Thyroid disease Paternal Grandmother    Thyroid disease Brother    Breast cancer Neg Hx     Social History Social History   Tobacco Use   Smoking status: Some Days    Packs/day: 5.00    Years: 1.00    Total pack years: 5.00    Types: Cigars, Cigarettes   Smokeless tobacco: Former   Tobacco comments:    03/28/19 7-8/day  Vaping Use   Vaping Use: Never used  Substance Use Topics   Alcohol use: No    Comment: quit 10/2018   Drug use: No   Allergies Patient has no known allergies.  Review of Systems Review of Systems  All other systems reviewed and are negative.   Physical Exam Vital Signs  I have reviewed the triage vital signs BP 119/78   Pulse 78   Temp 98 F (36.7 C)   Resp 18    Ht 5\' 3"  (1.6 m)   Wt 84 kg   LMP 07/26/2022   SpO2 98%   BMI 32.80 kg/m  Physical Exam Vitals and nursing note reviewed.  Constitutional:      General: She is not in acute distress.    Appearance: She is well-developed.  HENT:     Head: Normocephalic and atraumatic.     Mouth/Throat:     Mouth: Mucous membranes are moist.  Eyes:     Pupils: Pupils are equal, round, and reactive to light.  Cardiovascular:     Rate and Rhythm: Normal rate and regular rhythm.     Heart sounds: No murmur heard. Pulmonary:     Effort: Pulmonary effort is normal. No respiratory distress.     Breath sounds: Normal breath sounds.  Chest:     Chest wall: Tenderness present.  Abdominal:     General: Abdomen is flat.     Palpations: Abdomen is soft.     Tenderness: There is no abdominal tenderness.  Musculoskeletal:        General: No tenderness.     Right lower leg: No edema.     Left lower leg: No edema.     Comments: No sensory deficit to the left hand or left upper extremity.  Positive Tinel's sign which reproduces sensation of numbness and swelling in the left hand  Skin:    General: Skin is warm and dry.  Neurological:     General: No focal deficit present.     Mental Status: She is alert. Mental status is at baseline.  Psychiatric:        Mood and Affect: Mood normal.        Behavior: Behavior normal.     ED Results and Treatments Labs (all labs ordered are listed, but only abnormal results are displayed) Labs Reviewed  BASIC METABOLIC PANEL - Abnormal; Notable for the following components:      Result Value   Glucose, Bld 102 (*)    Calcium 8.5 (*)    All other components within normal limits  CBC - Abnormal; Notable for the following components:   WBC 11.4 (*)  All other components within normal limits  I-STAT BETA HCG BLOOD, ED ([facility], [facility], [facility] ONLY)  TROPONIN I (HIGH SENSITIVITY)  TROPONIN I (HIGH SENSITIVITY)                                                                                                                           Radiology DG Chest 2 View  Result Date: 08/18/2022 CLINICAL DATA:  Chest pain EXAM: CHEST - 2 VIEW COMPARISON:  04/24/2022 FINDINGS: The heart size and mediastinal contours are within normal limits. Both lungs are clear. The visualized skeletal structures are unremarkable. IMPRESSION: No active cardiopulmonary disease. Electronically Signed   By: Jasmine Pang M.D.   On: 08/18/2022 22:36    Pertinent labs & imaging results that were available during my care of the patient were reviewed by me and considered in my medical decision making (see MDM for details).  Medications Ordered in ED Medications  acetaminophen (TYLENOL) tablet 1,000 mg (has no administration in time range)                                                                                                                                     Procedures Procedures  (including critical care time)  Medical Decision Making / ED Course   MDM:  42 year old female presenting with chest pain.  Patient well-appearing, physical exam with positive Tinel's sign on the left as well as some mild chest wall tenderness.  Suspect likely musculoskeletal chest pain.  Chest x-ray without evidence of pneumonia or pneumothorax.  Patient PERC negative, very low concern for PE.  Troponin negative x 2 and EKG nonconcerning for ACS.  Symptoms extremely atypical for ACS.  Doubt esophageal pathology without nausea or vomiting.  Discussed self-care for musculoskeletal pain advised follow-up with primary physician.  Suspect left hand numbness is unrelated to chest pain, she does have positive Tinel's sign which would correlate with carpal tunnel syndrome.  She reports doing repetitive housework daily.  Will provide her splint.  Advise follow-up with primary physician for this as well.      Additional history obtained: -External records from outside source obtained and reviewed including: Chart  review including previous notes, labs, imaging, consultation notes including UC Note yesterday   Lab Tests: -I ordered, reviewed, and interpreted labs.   The pertinent results include:   Labs Reviewed  BASIC METABOLIC PANEL - Abnormal; Notable for the  following components:      Result Value   Glucose, Bld 102 (*)    Calcium 8.5 (*)    All other components within normal limits  CBC - Abnormal; Notable for the following components:   WBC 11.4 (*)    All other components within normal limits  I-STAT BETA HCG BLOOD, ED ([facility], [facility], [facility] ONLY)  TROPONIN I (HIGH SENSITIVITY)  TROPONIN I (HIGH SENSITIVITY)    Notable for negative troponin x2  EKG   EKG Interpretation  Date/Time:  Wednesday August 18 2022 19:40:31 EST Ventricular Rate:  69 PR Interval:  126 QRS Duration: 102 QT Interval:  398 QTC Calculation: 426 R Axis:   78 Text Interpretation: Normal sinus rhythm Possible Left atrial enlargement Minimal voltage criteria for LVH, may be normal variant ( Cornell product ) Borderline ECG When compared with ECG of 24-Apr-2022 02:36, No significant change since last tracing Confirmed by Garnette Gunner [phone]) on 08/19/2022 7:16:13 AM         Imaging Studies ordered: I ordered imaging studies including CXR On my interpretation imaging demonstrates no acute process I independently visualized and interpreted imaging. I agree with the radiologist interpretation   Medicines ordered and prescription drug management: Meds ordered this encounter  Medications   acetaminophen (TYLENOL) tablet 1,000 mg    -I have reviewed the patients home medicines and have made adjustments as needed  Social Determinants of Health:  Diagnosis or treatment significantly limited by social determinants of health: obesity   Reevaluation: After the interventions noted above, I reevaluated the patient and found that they have improved  Co morbidities that complicate the patient evaluation  Past  Medical History:  Diagnosis Date   Chest pain    Headache    Hypertension    SOB (shortness of breath)    Thyroid disease    hyper   Tobacco user    Tuberculosis    as a child      Dispostion: Disposition decision including need for hospitalization was considered, and patient discharged from emergency department.    Final Clinical Impression(s) / ED Diagnoses Final diagnoses:  Atypical chest pain  Carpal tunnel syndrome of left wrist     This chart was dictated using voice recognition software.  Despite best efforts to proofread,  errors can occur which can change the documentation meaning.    Cristie Hem, MD 08/19/22 [id number]

## 2022-08-19 NOTE — ED Notes (Signed)
Pt called 3x no answer  

## 2022-09-08 ENCOUNTER — Ambulatory Visit: Payer: Self-pay | Admitting: *Deleted

## 2022-09-08 NOTE — Telephone Encounter (Signed)
Reason for Disposition  New-onset diabetes suspected (e.g., abnormally thirsty, frequent urination, weight loss)  Answer Assessment - Initial Assessment Questions 1. BLOOD GLUCOSE: "What is your blood glucose level?"      Glucoses are elevated.     2. ONSET: "When did you check the blood glucose?"     This morning 187.    Yesterday morning it was 191 3. USUAL RANGE: "What is your glucose level usually?" (e.g., usual fasting morning value, usual evening value)     I've never checked my blood so I don't know.  I haven't gone to the doctor and I want to schedule an appt. To see a doctor about it.    4. KETONES: "Do you check for ketones (urine or blood test strips)?" If Yes, ask: "What does the test show now?"      Not asked 5. TYPE 1 or 2:  "Do you know what type of diabetes you have?"  (e.g., Type 1, Type 2, Gestational; doesn't know)      Don't know    Hasn't been diagnosed with diabetes. 6. INSULIN: "Do you take insulin?" "What type of insulin(s) do you use? What is the mode of delivery? (syringe, pen; injection or pump)?"      No 7. DIABETES PILLS: "Do you take any pills for your diabetes?" If Yes, ask: "Have you missed taking any pills recently?"     No 8. OTHER SYMPTOMS: "Do you have any symptoms?" (e.g., fever, frequent urination, difficulty breathing, dizziness, weakness, vomiting)     I started feeling very tired, leg pain, I'm sleepy and fall asleep easily.     I told my cousin about it and she gave me her machine so I could check my blood sugar.   She said I might have diabetes. 9. PREGNANCY: "Is there any chance you are pregnant?" "When was your last menstrual period?"     Not asked  Protocols used: Diabetes - High Blood Sugar-A-AH

## 2022-09-08 NOTE — Telephone Encounter (Signed)
  Chief Complaint: Suspects she may have diabetes Symptoms: elevated glucose 180-190, thirsty, dry mouth, urinating frequently especially at night, legs hurt, very sleepy all the time Frequency: Using her cousin's glucose meter to check her glucose.   Pt having same symptoms cousin did when cousin diagnosed with diabetes.  Pertinent Negatives: Patient denies having a diagnoses of diabetes.    Disposition: [] ED /[] Urgent Care (no appt availability in office) / [x] Appointment(In office/virtual)/ []  [facility]/ [] Home Care/ [] Refused Recommended Disposition /[] [facility]/ []  Follow-up with PCP Additional Notes: Appt made with Geryl Rankins, NP for November 09, 2022 at 9:50.   I went over s/s to go to the ED for:  feeling dizzy, very weak, vomiting, breathing rapidly.     She called in with Spanish interpreter Willette Pa [phone]

## 2022-11-09 ENCOUNTER — Ambulatory Visit: Payer: Self-pay | Admitting: Nurse Practitioner

## 2022-11-15 ENCOUNTER — Other Ambulatory Visit: Payer: Self-pay

## 2022-11-25 ENCOUNTER — Ambulatory Visit: Payer: Self-pay | Admitting: Physician Assistant

## 2022-12-06 ENCOUNTER — Other Ambulatory Visit: Payer: Self-pay

## 2022-12-06 ENCOUNTER — Ambulatory Visit: Payer: Self-pay | Attending: Physician Assistant | Admitting: Physician Assistant

## 2022-12-06 ENCOUNTER — Encounter: Payer: Self-pay | Admitting: Physician Assistant

## 2022-12-06 VITALS — BP 133/80 | HR 75 | Ht 63.0 in | Wt 185.2 lb

## 2022-12-06 DIAGNOSIS — M5440 Lumbago with sciatica, unspecified side: Secondary | ICD-10-CM

## 2022-12-06 DIAGNOSIS — R739 Hyperglycemia, unspecified: Secondary | ICD-10-CM

## 2022-12-06 DIAGNOSIS — R400 Somnolence: Secondary | ICD-10-CM

## 2022-12-06 DIAGNOSIS — E0500 Thyrotoxicosis with diffuse goiter without thyrotoxic crisis or storm: Secondary | ICD-10-CM

## 2022-12-06 MED ORDER — IBUPROFEN 800 MG PO TABS
800.0000 mg | ORAL_TABLET | Freq: Three times a day (TID) | ORAL | 0 refills | Status: DC
  Filled 2022-12-06: qty 21, 7d supply, fill #0

## 2022-12-06 MED ORDER — METFORMIN HCL 500 MG PO TABS
500.0000 mg | ORAL_TABLET | Freq: Two times a day (BID) | ORAL | 3 refills | Status: DC
  Filled 2022-12-06: qty 180, 90d supply, fill #0

## 2022-12-06 MED ORDER — METHOCARBAMOL 500 MG PO TABS
1000.0000 mg | ORAL_TABLET | Freq: Three times a day (TID) | ORAL | 0 refills | Status: DC | PRN
  Filled 2022-12-06: qty 90, 15d supply, fill #0

## 2022-12-06 NOTE — Progress Notes (Signed)
Patient ID: Tracy Carson, female   DOB: September 14, 1980, 42 y.o.   MRN: [id number]   Tracy Carson, is a 42 y.o. female  [id number]  [id number]  DOB - Jul 07, 1981  Chief Complaint  Patient presents with   Leg Pain       Subjective:   Tracy Carson is a 42 y.o. female here today for multiple issues.  Low back pain occasionally that radiates into R leg at times.  NKI.  No weakness.  Ibu and robaxin have helped.  She has been checking her blood sugars at home with a family members glucometer and readings have been from 150-200.    She is complaining of daytime somnolence.  She feels like she is going to gl to sleep after eating or while driving(she hasn't).  She says she IS sleeping well at night and then still tired on awakening.    No problems updated.  ALLERGIES: No Known Allergies  PAST MEDICAL HISTORY: Past Medical History:  Diagnosis Date   Chest pain    Headache    Hypertension    SOB (shortness of breath)    Thyroid disease    hyper   Tobacco user    Tuberculosis    as a child    MEDICATIONS AT HOME: Prior to Admission medications   Medication Sig Start Date End Date Taking? Authorizing Provider  metFORMIN (GLUCOPHAGE) 500 MG tablet Take 1 tablet (500 mg total) by mouth 2 (two) times daily with a meal. 12/06/22  Yes Kimora Stankovic M, PA-C  ibuprofen (ADVIL) 800 MG tablet Take 1 tablet (800 mg total) by mouth 3 (three) times daily. 12/06/22   Anders Simmonds, PA-C  levothyroxine (SYNTHROID) 125 MCG tablet Take 1 tablet (125 mcg total) by mouth daily before breakfast. 03/02/22 03/02/23  Reather Littler, MD  methocarbamol (ROBAXIN) 500 MG tablet Take 2 tablets (1,000 mg total) by mouth every 8 (eight) hours as needed for muscle spasms. 12/06/22   Anders Simmonds, PA-C  nicotine (NICODERM CQ) 7 mg/24hr patch Place 1 patch (7 mg total) onto the skin daily. Patient not taking: Reported on 09/29/2021 06/04/20   [name], MD    ROS: Neg HEENT Neg  resp Neg cardiac Neg GI Neg GU Neg psych Neg neuro  Objective:   Vitals:   12/06/22 0920  BP: 133/80  Pulse: 75  SpO2: 98%  Weight: 185 lb 3.2 oz (84 kg)  Height: 5\' 3"  (1.6 m)   Exam General appearance : Awake, alert, not in any distress. Speech Clear. Not toxic looking HEENT: Atraumatic and Normocephalic, mallampati 2 to 3 Neck: Supple, no JVD. No cervical lymphadenopathy.  Chest: Good air entry bilaterally, CTAB.  No rales/rhonchi/wheezing CVS: S1 S2 regular, no murmurs.  R LE DTR diminished compared to L.  Back with neg SLR B.   Extremities: B/L Lower Ext shows no edema, both legs are warm to touch Neurology: Awake alert, and oriented X 3, CN II-XII intact, Non focal Skin: No Rash  Data Review Lab Results  Component Value Date   HGBA1C 5.2 11/15/2016    Assessment & Plan   1. Daytime somnolence Sleep hygiene reviewed - Split night study; Future  2. Graves disease On synthroid currently - Thyroid Panel With TSH  3. Back pain of lumbar region with sciatica - ibuprofen (ADVIL) 800 MG tablet; Take 1 tablet (800 mg total) by mouth 3 (three) times daily.  Dispense: 21 tablet; Refill: 0 - methocarbamol (ROBAXIN) 500 MG tablet; Take  2 tablets (1,000 mg total) by mouth every 8 (eight) hours as needed for muscle spasms.  Dispense: 90 tablet; Refill: 0  4. Hyperglycemia Based on home numbers of 150-200 she at least has prediabetes if not diabetes. I have had a lengthy discussion and provided education about insulin resistance and the intake of too much sugar/refined carbohydrates.  I have advised the patient to work at a goal of eliminating sugary drinks, candy, desserts, sweets, refined sugars, processed foods, and white carbohydrates.  The patient expresses understanding.   - Hemoglobin A1c - metFORMIN (GLUCOPHAGE) 500 MG tablet; Take 1 tablet (500 mg total) by mouth 2 (two) times daily with a meal.  Dispense: 180 tablet; Refill: 3    Return in about 3  months (around 03/08/2023).  The patient was given clear instructions to go to ER or return to medical center if symptoms don't improve, worsen or new problems develop. The patient verbalized understanding. The patient was told to call to get lab results if they haven't heard anything in the next week.      Georgian Co, PA-C [facility] [place], [place] [phone]   12/06/2022, 9:48 AM

## 2022-12-06 NOTE — Patient Instructions (Signed)
Dolor de espalda crnico Chronic Back Pain El dolor de espalda crnico es aquel que dura ms de 3 meses. El dolor puede empeorar en ciertos momentos (exacerbaciones). Hay cosas que puede hacer en su casa para Runner, broadcasting/film/video. Siga estas instrucciones en su casa: Controle si hay algn cambio en sus sntomas. Tome estas medidas para Acupuncturist dolor: Control del dolor y la rigidez     Si se lo indican, aplique hielo sobre la zona dolorida. Pueden indicarle que use hielo de 24 a 48 horas luego del comienzo de Investment banker, corporate. Ponga el hielo en una bolsa plstica. Coloque una toalla entre la piel y Copy. Aplique el hielo durante 20 minutos, 2 a 3 veces por da. Si se lo indican, aplique calor sobre la zona dolorida. Hgalo con la frecuencia que le haya indicado el mdico. Use la fuente de calor que el mdico le recomiende, como una compresa de calor hmedo o una almohadilla trmica. Coloque una toalla entre la piel y la fuente de Airline pilot. Aplique calor durante 20 a 30 minutos. Si la piel se le pone de color rojo brillante, quite el hielo o el calor de inmediato para evitar daos en la piel. El [place] de dao es mayor si no puede sentir dolor, Airline pilot o fro. Sumrjase en un bao clido. Esto puede Engineer, materials. Actividad        Evite inclinarse y Education officer, environmental otras actividades que Forensic scientist. Cuando se ponga de pie: Mantenga la parte alta de la espalda y el cuello rectos. Mantenga los hombros [employer]. Evite encorvarse. Cuando se siente: Mantenga la espalda recta. Relaje los hombros. No curve los hombros ni los [employer] Rest[employer]. No permanezca sentado o de pie en el mismo lugar [place]. Durante el da, haga pausas breves para descansar. Con frecuencia, recostarse o permanecer de pie es mejor que permanecer sentado. Descansar puede ayudar a Engineer, materials. Cuando est sentado o de pie por [employer], haga un poco de [place] o ejercicios de estiramiento.  Esto ayudar a Transport planner rigidez y Chief Technology Officer. Haga ejercicio con regularidad. Pregntele al mdico qu actividades son seguras para usted. Es posible que Personnel officer objetos. Pregntele al mdico cunto peso puede levantar sin correr [employer]. Si levanta objetos: Flexione las rodillas. Mantenga el peso cerca del cuerpo. No gire el cuerpo. Medicamentos Use los medicamentos de venta libre y los recetados solamente como se lo haya indicado el mdico. Puede usar medicamentos para Chief Technology Officer y la hinchazn. Estos pueden tomarse por boca o colocarse sobre la piel. Tambin pueden darle relajantes musculares. Consulte a su mdico si el medicamento que le recetaron: Requiere que evite conducir o usar maquinaria. Puede causarle dificultad para defecar (estreimiento). Es posible que deba tomar medidas para prevenir o tratar los problemas para defecar: Product manager suficiente lquido para Radio producer pis (orina) de color amarillo plido. Usar medicamentos recetados o de Sales promotion account executive. [place] alimentos ricos en fibra. Entre ellos, frijoles, cereales integrales y frutas y verduras frescas. Limitar los alimentos con alto contenido de grasa y International aid/development worker. Estos incluyen alimentos fritos o dulces. Instrucciones generales  Duerma sobre un colchn firme. Intente acostarse de costado, con las rodillas ligeramente flexionadas. Si se recuesta Fisher [employer], coloque una almohada debajo de las rodillas. No fume ni consuma ningn producto que contenga nicotina o tabaco. Si necesita ayuda para dejar de fumar, consulte al mdico. Comunquese con un mdico si: El dolor no se alivia con reposo ni con medicamentos.  Siente un dolor nuevo. Tiene fiebre. Pierde peso con rapidez. Tiene dificultad para Xcel Energy cotidianas. Siente debilidad en una o ambas piernas o pies. Pierde la sensibilidad (tiene adormecimiento) en una o ambas piernas o pies. Solicite ayuda de inmediato si: No puede controlar la miccin o la  defecacin. Tiene un dolor muy intenso en la espalda y: [place] ganas de vomitar (nuseas). Vomita. Siente dolor en el pecho o en la barriga (abdomen). Le falta el aire. Se desmaya. Estos sntomas pueden Customer service manager. Solicite ayuda de inmediato. Llame al 911. No espere a ver si los sntomas desaparecen. No conduzca por sus propios medios [employer]. Esta informacin no tiene Theme park manager el consejo del mdico. Asegrese de hacerle al mdico cualquier pregunta que tenga. Document Revised: 03/31/2022 Document Reviewed: 03/31/2022 Elsevier Patient Education  2023 Elsevier Inc. Apnea del sueo Sleep Apnea La apnea del sueo afecta la respiracin mientras se duerme. Hace que la respiracin se detenga durante 10 segundos o ms tiempo, o se vuelva superficial. Las personas con apnea del sueo suelen Secretary/administrator. Tambin puede aumentar el riesgo de: Infarto de miocardio. Accidente cerebrovascular. Tener mucho sobrepeso (obesidad). Diabetes. Insuficiencia cardaca. Latidos cardacos irregulares. Presin arterial alta. El [place] del tratamiento es ayudarle a respirar normalmente otra vez. Cules son las causas?  La causa ms frecuente de esta afeccin es la obstruccin o el colapso de las vas respiratorias. Existen tres tipos de apnea del sueo: Apnea obstructiva del sueo. Esta ocurre cuando las vas respiratorias se obstruyen o colapsan. Apnea central del sueo. Esta ocurre cuando el cerebro no enva las seales correctas a los msculos que controlan la respiracin. Apnea mixta del sueo. Esta es una combinacin de apnea obstructiva y central del sueo. Qu incrementa el riesgo? Tener sobrepeso. Fumar. Tener vas respiratorias pequeas. El envejecimiento. Ser hombre. El consumo de alcohol. Tomar medicamentos para calmarse (sedantes o tranquilizantes). Tener familiares con esta afeccin. Tener la lengua o las amgdalas ms grandes de lo normal. Cules  son los signos o sntomas? Dificultad para permanecer dormido. Ronquidos fuertes. Dolor de cabeza por la maana. Despertarse con falta de aliento. Sequedad de boca o dolor de garganta por la maana. Estar somnoliento o cansado Administrator. Si tiene sueo o est cansado Baxter International, tambin es posible que: No pueda enfocar la mente (concentrarse). Olvide las cosas. Se enfade mucho y tenga cambios de humor. Se sienta triste (deprimido). Haya cambios en su personalidad. Tenga menos inters en el sexo si es [place]. Sea incapaz de tener una ereccin si es hombre. Cmo se trata?  Dormir de Mudlogger. Usar un medicamento para eliminar la mucosidad de la nariz (descongestivo). Evitar el consumo de alcohol, medicamentos que ayudan a relajarse o ciertos analgsicos (narcticos). [place] de [place], si es necesario. Cambios en la dieta. Dejar de fumar. Usar una mquina para abrir las vas respiratorias mientras duerme; por ejemplo: Un aparato bucal. Se trata de una boquilla que desplaza la mandbula hacia adelante. Un dispositivo CPAP. Este dispositivo sopla aire a travs de una mscara cuando usted exhala. Un dispositivo EPAP. Este tiene vlvulas que se colocan en cada fosa nasal. Un dispositivo BIPAP. Este dispositivo sopla aire a travs de una mscara cuando usted inhala y exhala. Someterse a Biomedical engineer tratamientos no Comptroller. Siga estas indicaciones en su casa: Estilo de [employer] [place] cambios que le haya recomendado el mdico. Siga una dieta saludable. Baje de peso, si es necesario. Evite el alcohol, los medicamentos para  relajarse y algunos analgsicos. No fume ni consuma ningn producto que contenga nicotina o tabaco. Si necesita ayuda para dejar de consumir estos productos, consulte al mdico. Indicaciones generales Use los medicamentos de venta libre y los recetados solamente como se lo haya indicado el mdico. Si le proporcionaron una mquina para usar mientras  duerme, sela solamente como se lo haya indicado el mdico. Si va a someterse a una ciruga, no olvide informarle al mdico que tiene apnea del sueo. Puede ser necesario que lleve su dispositivo consigo. Concurra a todas las visitas de seguimiento. Comunquese con un mdico si: El Astronomer para usar mientras duerme le [place] o parece no funcionar. No se siente mejor. Empeora. Solicite ayuda de inmediato si: Le duele el pecho. Tiene dificultad para inhalar suficiente aire. Tiene molestias en la espalda, en los brazos o en el [place]. Tiene dificultad para hablar. Siente debilidad en un lado del cuerpo. Se le cae un lado de la cara. Estos sntomas pueden Customer service manager. Solicite ayuda de inmediato. Comunquese con el servicio de emergencias de su localidad (911 en los [place]). No espere a ver si los sntomas desaparecen. No conduzca por sus propios medios [employer] hospital. Resumen Esta afeccin afecta la respiracin durante el sueo. La causa ms frecuente es la obstruccin o el colapso de las vas respiratorias. El [place] del tratamiento es ayudarlo a respirar normalmente mientras duerme. Esta informacin no tiene Theme park manager el consejo del mdico. Asegrese de hacerle al mdico cualquier pregunta que tenga. Document Revised: 03/25/2021 Document Reviewed: 03/25/2021 Elsevier Patient Education  2023 [employer].

## 2022-12-06 NOTE — Progress Notes (Signed)
Pt is here for Leg Pain   Complaining of bi-lat leg pain, fatigue and trouble sleeping for X3 months   No injury to her legs per pt   Requesting refill on robaxin

## 2022-12-07 LAB — HEMOGLOBIN A1C
Est. average glucose Bld gHb Est-mCnc: 134 mg/dL
Hgb A1c MFr Bld: 6.3 % — ABNORMAL HIGH (ref 4.8–5.6)

## 2022-12-07 LAB — THYROID PANEL WITH TSH
Free Thyroxine Index: 2 (ref 1.2–4.9)
T3 Uptake Ratio: 21 % — ABNORMAL LOW (ref 24–39)
T4, Total: 9.6 ug/dL (ref 4.5–12.0)
TSH: 3.46 u[IU]/mL (ref 0.450–4.500)

## 2022-12-24 ENCOUNTER — Encounter: Payer: Self-pay | Admitting: *Deleted

## 2023-01-31 ENCOUNTER — Encounter (HOSPITAL_BASED_OUTPATIENT_CLINIC_OR_DEPARTMENT_OTHER): Payer: Self-pay | Admitting: Internal Medicine

## 2023-03-01 ENCOUNTER — Other Ambulatory Visit (INDEPENDENT_AMBULATORY_CARE_PROVIDER_SITE_OTHER): Payer: Self-pay

## 2023-03-01 DIAGNOSIS — E890 Postprocedural hypothyroidism: Secondary | ICD-10-CM

## 2023-03-01 LAB — TSH: TSH: 7.23 u[IU]/mL — ABNORMAL HIGH (ref 0.35–5.50)

## 2023-03-08 ENCOUNTER — Ambulatory Visit: Payer: No Typology Code available for payment source | Admitting: Endocrinology

## 2023-03-10 ENCOUNTER — Ambulatory Visit: Payer: Self-pay | Attending: Family Medicine | Admitting: Family Medicine

## 2023-03-10 ENCOUNTER — Other Ambulatory Visit: Payer: Self-pay

## 2023-03-10 ENCOUNTER — Encounter: Payer: Self-pay | Admitting: Family Medicine

## 2023-03-10 VITALS — BP 121/78 | HR 67 | Ht 63.0 in | Wt 187.2 lb

## 2023-03-10 DIAGNOSIS — Z7984 Long term (current) use of oral hypoglycemic drugs: Secondary | ICD-10-CM

## 2023-03-10 DIAGNOSIS — E0500 Thyrotoxicosis with diffuse goiter without thyrotoxic crisis or storm: Secondary | ICD-10-CM

## 2023-03-10 DIAGNOSIS — E1169 Type 2 diabetes mellitus with other specified complication: Secondary | ICD-10-CM

## 2023-03-10 DIAGNOSIS — H1013 Acute atopic conjunctivitis, bilateral: Secondary | ICD-10-CM

## 2023-03-10 DIAGNOSIS — F17210 Nicotine dependence, cigarettes, uncomplicated: Secondary | ICD-10-CM

## 2023-03-10 DIAGNOSIS — R739 Hyperglycemia, unspecified: Secondary | ICD-10-CM

## 2023-03-10 DIAGNOSIS — I10 Essential (primary) hypertension: Secondary | ICD-10-CM

## 2023-03-10 DIAGNOSIS — E119 Type 2 diabetes mellitus without complications: Secondary | ICD-10-CM | POA: Insufficient documentation

## 2023-03-10 LAB — POCT GLYCOSYLATED HEMOGLOBIN (HGB A1C): HbA1c, POC (controlled diabetic range): 6.5 % (ref 0.0–7.0)

## 2023-03-10 MED ORDER — ATORVASTATIN CALCIUM 20 MG PO TABS
20.0000 mg | ORAL_TABLET | Freq: Every day | ORAL | 1 refills | Status: DC
  Filled 2023-03-10: qty 90, 90d supply, fill #0
  Filled 2023-07-06 (×2): qty 90, 90d supply, fill #1

## 2023-03-10 MED ORDER — OLOPATADINE HCL 0.1 % OP SOLN
1.0000 [drp] | Freq: Two times a day (BID) | OPHTHALMIC | 1 refills | Status: DC
  Filled 2023-03-10: qty 5, 25d supply, fill #0

## 2023-03-10 MED ORDER — AMLODIPINE BESYLATE 2.5 MG PO TABS
2.5000 mg | ORAL_TABLET | Freq: Every day | ORAL | 1 refills | Status: DC
  Filled 2023-03-10: qty 90, 90d supply, fill #0

## 2023-03-10 MED ORDER — METFORMIN HCL 500 MG PO TABS
500.0000 mg | ORAL_TABLET | Freq: Two times a day (BID) | ORAL | 1 refills | Status: DC
  Filled 2023-03-10: qty 180, 90d supply, fill #0

## 2023-03-10 NOTE — Progress Notes (Signed)
Subjective:  Patient ID: Tracy Carson, female    DOB: [birth year]  Age: 42 y.o. MRN: [id number]  CC: Medical Management of Chronic Issues (Itching in both eyes/Elevated BP reading at home.)   HPI Tracy Carson is a 42 y.o. year old female with a history of f treatment induced hypothyroidism (radioactive iodine treatment of Graves' disease in 2018 and 2021), migraines, anxiety, and depression.  She is followed by endocrinology for treatment of her hyperthyroidism. Migraines were previously managed by Neurology but she has not seen them in a while.  Interval History: Discussed the use of AI scribe software for clinical note transcription with the patient, who gave verbal consent to proceed.  She presents with eye irritation and elevated blood pressure at home. The eye irritation, described as 'itchiness,' has been ongoing for three weeks. She reports that her eyes get red and produce a lot of mucus, but denies blurry vision or pain. She also notes that her eyes are swollen upon waking. She denies runny nose, postnasal drip, or itchy throat, but does report a chronic stuffy nose and occasional cough.  The patient has been monitoring her blood pressure at home, which has been elevated with systolic readings of 140-150 and diastolic readings of 80-90. She reports chest pain associated with these elevated readings.  She had an ED visit for atypical chest pain in 07/2022 and cardiac etiology was ruled out, pain was thought to be musculoskeletal.  Her blood pressure is normal during the current visit.  The patient's prediabetes has progressed to diabetes, with an increase in her A1c from 6.3 to 6.5. She reports inconsistent use of metformin due to uncertainty about her diabetic status. She expresses frustration with difficulty losing weight despite dietary changes, including reducing sweet intake and skipping dinner. She also reports a history of thyroid disease and a recent family history of  uterine cancer.   Graves' disease is managed by endocrine and she has an upcoming appointment tomorrow.  Recent TSH is elevated and she remains on levothyroxine which she endorses adherence with.       Past Medical History:  Diagnosis Date   Chest pain    Headache    Hypertension    SOB (shortness of breath)    Thyroid disease    hyper   Tobacco user    Tuberculosis    as a child    Past Surgical History:  Procedure Laterality Date   CESAREAN SECTION  07/02/2011   Procedure: CESAREAN SECTION;  Surgeon: Roseanna Rainbow, MD;  Location: [facility];  Service: Gynecology;  Laterality: N/A;   CHOLECYSTECTOMY  2002    Family History  Problem Relation Age of Onset   Diabetes Mother    Hypertension Father    Thyroid disease Sister    Thyroid disease Paternal Grandmother    Thyroid disease Brother    Breast cancer Neg Hx     Social History   Socioeconomic History   Marital status: Married    Spouse name: Tracy Carson   Number of children: 4   Years of education: 7   Highest education level: 7th grade  Occupational History    Comment: na  Tobacco Use   Smoking status: Some Days    Current packs/day: 5.00    Average packs/day: 5.0 packs/day for 1 year (5.0 ttl pk-yrs)    Types: Cigars, Cigarettes   Smokeless tobacco: Former   Tobacco comments:    03/28/19 7-8/day  Vaping Use   Vaping status: Never  Used  Substance and Sexual Activity   Alcohol use: No    Comment: quit 10/2018   Drug use: No   Sexual activity: Yes    Birth control/protection: None  Other Topics Concern   Not on file  Social History Narrative   Lives with spouse, 4 daughters   Some caffeine   Social Determinants of Health   Financial Resource Strain: Not on file  Food Insecurity: No Food Insecurity (11/26/2021)   Hunger Vital Sign    Worried About Running Out of Food in the Last Year: Never true    Ran Out of Food in the Last Year: Never true  Transportation Needs: No Transportation Needs  (11/26/2021)   PRAPARE - Administrator, Civil Service (Medical): No    Lack of Transportation (Non-Medical): No  Physical Activity: Not on file  Stress: Not on file  Social Connections: Not on file    No Known Allergies  Outpatient Medications Prior to Visit  Medication Sig Dispense Refill   ibuprofen (ADVIL) 800 MG tablet Take 1 tablet (800 mg total) by mouth 3 (three) times daily. 21 tablet 0   metFORMIN (GLUCOPHAGE) 500 MG tablet Take 1 tablet (500 mg total) by mouth 2 (two) times daily with a meal. 180 tablet 3   levothyroxine (SYNTHROID) 125 MCG tablet Take 1 tablet (125 mcg total) by mouth daily before breakfast. 90 tablet 3   methocarbamol (ROBAXIN) 500 MG tablet Take 2 tablets (1,000 mg total) by mouth every 8 (eight) hours as needed for muscle spasms. (Patient not taking: Reported on 03/10/2023) 90 tablet 0   nicotine (NICODERM CQ) 7 mg/24hr patch Place 1 patch (7 mg total) onto the skin daily. (Patient not taking: Reported on 09/29/2021) 28 patch 2   No facility-administered medications prior to visit.     ROS Review of Systems  Constitutional:  Negative for activity change and appetite change.  HENT:  Negative for sinus pressure and sore throat.   Eyes:  Positive for itching.  Respiratory:  Negative for chest tightness, shortness of breath and wheezing.   Cardiovascular:  Negative for chest pain and palpitations.  Gastrointestinal:  Negative for abdominal distention, abdominal pain and constipation.  Genitourinary: Negative.   Musculoskeletal: Negative.   Psychiatric/Behavioral:  Negative for behavioral problems and dysphoric mood.     Objective:  BP 121/78   Pulse 67   Ht 5\' 3"  (1.6 m)   Wt 187 lb 3.2 oz (84.9 kg)   SpO2 96%   BMI 33.16 kg/m      03/10/2023   11:18 AM 12/06/2022    9:20 AM 08/19/2022    8:51 AM  BP/Weight  Systolic BP 121 133 115  Diastolic BP 78 80 78  Wt. (Lbs) 187.2 185.2   BMI 33.16 kg/m2 32.81 kg/m2       Physical  Exam Constitutional:      Appearance: She is well-developed.  Eyes:     Conjunctiva/sclera: Conjunctivae normal.     Comments: Proptosis  Cardiovascular:     Rate and Rhythm: Normal rate.     Heart sounds: Normal heart sounds. No murmur heard. Pulmonary:     Effort: Pulmonary effort is normal.     Breath sounds: Normal breath sounds. No wheezing or rales.  Chest:     Chest wall: No tenderness.  Abdominal:     General: Bowel sounds are normal. There is no distension.     Palpations: Abdomen is soft. There is no mass.  Tenderness: There is no abdominal tenderness.  Musculoskeletal:        General: Normal range of motion.     Right lower leg: No edema.     Left lower leg: No edema.  Neurological:     Mental Status: She is alert and oriented to person, place, and time.  Psychiatric:        Mood and Affect: Mood normal.        Latest Ref Rng & Units 08/18/2022   10:07 PM 04/24/2022    2:52 AM 06/04/2020   10:38 AM  CMP  Glucose 70 - 99 mg/dL 161  096  88   BUN 6 - 20 mg/dL 8  5  7    Creatinine 0.44 - 1.00 mg/dL 0.45  4.09  8.11   Sodium 135 - 145 mmol/L 135  139  138   Potassium 3.5 - 5.1 mmol/L 3.5  3.4  4.3   Chloride 98 - 111 mmol/L 101  105  103   CO2 22 - 32 mmol/L 24  24  21    Calcium 8.9 - 10.3 mg/dL 8.5  9.2  9.1     Lipid Panel     Component Value Date/Time   CHOL 112 11/15/2016 1038   TRIG 162 (H) 11/15/2016 1038   HDL 24 (L) 11/15/2016 1038   CHOLHDL 4.7 (H) 11/15/2016 1038   LDLCALC 56 11/15/2016 1038    CBC    Component Value Date/Time   WBC 11.4 (H) 08/18/2022 2207   RBC 4.36 08/18/2022 2207   HGB 14.7 08/18/2022 2207   HGB 13.3 11/15/2016 1038   HCT 41.1 08/18/2022 2207   HCT 38.1 11/15/2016 1038   PLT 219 08/18/2022 2207   PLT 185 11/15/2016 1038   MCV 94.3 08/18/2022 2207   MCV 81 11/15/2016 1038   MCH 33.7 08/18/2022 2207   MCHC 35.8 08/18/2022 2207   RDW 12.0 08/18/2022 2207   RDW 13.5 11/15/2016 1038   LYMPHSABS 1.7 05/28/2019  1332   LYMPHSABS 2.7 11/15/2016 1038   MONOABS 0.3 05/28/2019 1332   EOSABS 0.2 05/28/2019 1332   EOSABS 0.2 11/15/2016 1038   BASOSABS 0.0 05/28/2019 1332   BASOSABS 0.0 11/15/2016 1038    Lab Results  Component Value Date   HGBA1C 6.5 03/10/2023   Lab Results  Component Value Date   TSH 7.23 (H) 03/01/2023    Assessment & Plan:      Allergic Conjunctivitis Reports of eye irritation, redness, and mucus production for the past three weeks. No associated blurry vision or pain. Placed on Patanol  Hypertension Reports of elevated blood pressure readings at home (140-150/80-90). Blood pressure within normal limits during office visit. -Start Amlodipine 2.5mg  daily based on elevated home readings -Counseled on blood pressure goal of less than 130/80, low-sodium, DASH diet, medication compliance, 150 minutes of moderate intensity exercise per week. Discussed medication compliance, adverse effects.   Type 2 Diabetes Mellitus A1c increased from 6.3 to 6.5, confirming diagnosis of diabetes. Reports inconsistent use of Metformin due to uncertainty about diagnosis. -Resume Metformin twice daily. -Encourage dietary modifications and increased physical activity for weight loss. -Discussed potential use of Ozempic weekly injectable medication for diabetes control and weight loss, patient to consider for future. -Order glucometer for home blood sugar monitoring. -Annual eye exam, foot exam, and urine check recommended.  Hyperlipidemia In context of diabetes, -Start statin for primary prevention of cardiovascular disease   Graves' disease Elevated TSH She has an upcoming appointment with her endocrinologist  to adjust her medication regimen   General Health Maintenance / Followup Plans -Schedule Pap smear and mammogram for next visit in one month. -Follow-up with endocrinologist for thyroid condition.          Meds ordered this encounter  Medications   olopatadine  (PATANOL) 0.1 % ophthalmic solution    Sig: Place 1 drop into both eyes 2 (two) times daily.    Dispense:  5 mL    Refill:  1   amLODipine (NORVASC) 2.5 MG tablet    Sig: Take 1 tablet (2.5 mg total) by mouth daily.    Dispense:  90 tablet    Refill:  1   atorvastatin (LIPITOR) 20 MG tablet    Sig: Take 1 tablet (20 mg total) by mouth daily.    Dispense:  90 tablet    Refill:  1   metFORMIN (GLUCOPHAGE) 500 MG tablet    Sig: Take 1 tablet (500 mg total) by mouth 2 (two) times daily with a meal.    Dispense:  180 tablet    Refill:  1    Follow-up: Return in about 1 month (around 04/10/2023) for CPE/ Preventive Health Exam.       [name], MD, FAAFP. [facility] [place], [place] [phone]   03/10/2023, 12:15 PM

## 2023-03-10 NOTE — Patient Instructions (Signed)
Plan de accin para la diabetes mellitus Diabetes Mellitus Action Plan Seguir un plan de accin para la diabetes es una forma de controlar sus sntomas de diabetes (diabetes mellitus). El plan se codifica con colores para ayudarlo a comprender qu acciones necesita tomar en funcin de los sntomas que est teniendo. Si tiene sntomas pertenecientes a la zona roja, necesita buscar atencin mdica inmediatamente. Si tiene sntomas pertenecientes a la zona amarilla, est teniendo problemas. Si tiene sntomas pertenecientes a la zona verde, significa que se encuentra bien. Aprender y comprender la diabetes puede llevar tiempo. Siga el plan que elabor con el mdico. Conozca el rango deseado para su nivel de azcar en la sangre (glucosa), y revise su plan de tratamiento con su mdico en cada visita. El rango deseado para mi nivel de azcar en la sangre es __________________________ mg/dl. Zona roja Obtenga ayuda de inmediato si observa cualquiera de estos sntomas: Un resultado de azcar en la sangre que est por debajo de 54 mg/dl (3 mmol/l). Un nivel de azcar en la sangre mayor o igual que 240 mg/dl (13,3 mmol/l) durante 2 das seguidos. Confusin o dificultad para pensar con claridad. Dificultad para respirar. Malestar o fiebre durante 2 o ms das que no mejora. Niveles moderados o altos de cetonas en la orina. Sentirse cansado o sin energa. Si tiene cualquiera de los sntomas pertenecientes a la zona roja, no espere para ver si desaparecen. Solicite atencin mdica de inmediato. Comunquese con el servicio de emergencias de su localidad (911 en los [place]). No conduzca por sus propios medios hasta el hospital. Si tiene un nivel de azcar en la sangre muy bajo (hipoglucemia grave) y no puede ingerir ningn alimento ni bebida, tal vez necesite glucagn. Asegrese de que un familiar o amigo sepa controlarle el nivel de azcar en la sangre y aplicarle glucagn. Puede necesitar tratamiento en  un hospital para esta afeccin. Zona amarilla Si tiene alguno de los siguientes sntomas, su diabetes no est controlada y usted necesitar hacer algunos cambios: Un nivel de azcar en la sangre mayor o igual que 240 mg/dl (13,3 mmol/l) durante 2 das seguidos. Un resultado de azcar en la sangre que est por debajo de 70 mg/dl (3,9 mmol/l). Otros sntomas de hipoglucemia, como: Temblores o sensacin de desvanecimiento. Confusin o irritabilidad. Sensacin de hambre. Latidos cardacos acelerados. Si tiene algn sntoma de la zona amarilla: Trate la hipoglucemia comiendo o bebiendo 15 gramos de hidratos de carbono de accin rpida. Siga las regla 15:15: Consuma 15 gramos de hidratos de carbono de accin rpida, como: 1 pomo de glucosa en gel. 4 comprimidos de glucosa. 4 onzas (120 ml) de jugo de frutas. 4 onzas (120 ml) de refresco comn (no diettico). Controle su nivel de azcar en la sangre 15 minutos despus de ingerir el hidrato de carbono. Si este nuevo nivel de azcar en la sangre todava es igual o menor que 70 mg/dl (3,9 mmol/l), ingiera nuevamente 15 gramos de un hidrato de carbono. Si el nivel de azcar en la sangre no supera los 70 mg/dl (3,9 mmol/l) despus de 3 intentos, solicite ayuda mdica de inmediato. Ingiera una comida o un refrigerio en el transcurso de 1 hora despus de que el nivel de azcar en la sangre se haya normalizado. Siga tomando los medicamentos diarios como se lo haya indicado el mdico. Controle su nivel de azcar en la sangre con ms frecuencia que lo hara normalmente. Anote los resultados. Llame al mdico si tiene problemas para mantener el nivel de azcar   en la sangre dentro del rango deseado.  Zona verde Estos signos significan que se encuentra bien y puede continuar haciendo lo que est haciendo para Chief Operating Officer la diabetes: Su nivel de azcar en la sangre est en el rango deseado. En la [employer], el nivel de International aid/development worker en la sangre antes de  una comida (preprandial) debera ser de 80 a 130 mg/dl (de 4.4 a 7.2 mmol/l). Se siente bien, y pueda volver a [employer] diarias. Si se encuentra en la zona verde, contine controlando su diabetes como se lo haya indicado el mdico. Para hacer esto: Siga una dieta saludable. Haga ejercicio regularmente. Controle su nivel de azcar en la sangre como se lo haya indicado el mdico. Tome los medicamentos como se lo haya indicado el mdico.  Dnde buscar ms informacin American Diabetes Association (ADA) (Asociacin Estadounidense de la Diabetes): diabetes.org Association of Diabetes Care & Education Specialists (ADCES) (Asociacin de Especialistas en Atencin y Educacin sobre la Diabetes): diabeteseducator.org Resumen Seguir un plan de accin para la diabetes, es una forma de controlar sus sntomas de diabetes. El plan se codifica con colores para ayudarlo a comprender qu acciones necesita tomar en funcin de los sntomas que est teniendo. Siga el plan que elabor con el mdico. Asegrese de conocer su nivel deseado de azcar en la sangre. Revise el plan de tratamiento con el mdico en todas las consultas. Esta informacin no tiene Theme park manager el consejo del mdico. Asegrese de hacerle al mdico cualquier pregunta que tenga. Document Revised: 02/21/2020 Document Reviewed: 02/21/2020 Elsevier Patient Education  2024 [employer].

## 2023-03-11 ENCOUNTER — Ambulatory Visit (INDEPENDENT_AMBULATORY_CARE_PROVIDER_SITE_OTHER): Payer: Self-pay | Admitting: Endocrinology

## 2023-03-11 ENCOUNTER — Other Ambulatory Visit: Payer: Self-pay

## 2023-03-11 ENCOUNTER — Telehealth: Payer: Self-pay | Admitting: Endocrinology

## 2023-03-11 ENCOUNTER — Encounter: Payer: Self-pay | Admitting: Endocrinology

## 2023-03-11 DIAGNOSIS — E890 Postprocedural hypothyroidism: Secondary | ICD-10-CM

## 2023-03-11 MED ORDER — LEVOTHYROXINE SODIUM 125 MCG PO TABS
125.0000 ug | ORAL_TABLET | Freq: Every day | ORAL | 1 refills | Status: DC
  Filled 2023-03-11: qty 30, 30d supply, fill #0

## 2023-03-11 NOTE — Progress Notes (Signed)
Patient ID: Tracy Carson, female   DOB: 1981/01/20, 42 y.o.   MRN: [id number]                                                                                                               Reason for Appointment:  follow-up visit    Chief complaint: Follow-up of thyroid   History of Present Illness:   Prior history: Her hyperthyroidism was diagnosed in 2012 before a pregnancy At that time she had symptoms of palpitations, shakiness, feeling excessively warm, difficulty sleeping, increased appetite and hair loss She was treated with methimazole She was recommended I-131 treatment in 2014 but even though she had an uptake test done she did not go for the treatment Subsequently has been very irregular with her follow-up program and medications She says she was taking her methimazole 2 tablets twice a day every third day or so because of cost Because of worsening symptoms of palpitations, feeling excessively hot and losing weight she presented to the emergency room on 11/08/16 and was admitted for 2 days  On her initial consultation in her free T4 was still very high at 3.04 and she was having problems with fatigue, weakness and heat intolerance along with tachycardia. Her methimazole was increased to 3 tablets twice a day initially   She finally agreed to do that I-131 treatment and this was done with 19.5 mCi on 02/16/17  RECENT history  In October 2018 she had normal thyroid functions and had no complaints but did not follow-up as directed subsequently In June 2020 at the ER she thought she had swollen glands in her neck along with anxiety and palpitations At that time her thyroid levels showed hyperthyroidism and free T4 was significantly high at 2.3 She was started on methimazole 10 mg twice daily by her PCP on 01/11/2019  Because of persistent hyperthyroidism she has been treated with 12 mCi of I-131 on 01/17/2020  She became hypothyroid in 8/21 with significantly low free  T4 However at that time she was asymptomatic  With 100 mcg of levothyroxine her TSH was still about 12 and free T4 lower  She was also complaining of fatigue as well as swelling of her eyes As before is taking 125 mg of LEVOTHYROXINE  Recently she has been occasionally missing her levothyroxine and she estimates that she may have missed 3-5 tablets in the last 30 days  No significant weight change On some days she does feel like she is tired and does not want to do anything but this is not consistent  She has periodic swelling of her eyes and some redness and itching, followed by ophthalmologist  TSH is which was consistently normal is now higher at 7.2, previously 3.5 about 3 months ago   Wt Readings from Last 3 Encounters:  03/11/23 187 lb 12.8 oz (85.2 kg)  03/10/23 187 lb 3.2 oz (84.9 kg)  12/06/22 185 lb 3.2 oz (84 kg)    Thyroid function tests as follows:     Lab Results  Component Value Date   TSH 7.23 (H) 03/01/2023   TSH 3.460 12/06/2022   TSH 3.40 02/25/2022   FREET4 0.96 06/10/2020   FREET4 0.28 (L) 04/28/2020   FREET4 0.41 (L) 03/03/2020     No results found for: "THYROTRECAB"   Allergies as of 03/11/2023   No Known Allergies      Medication List        Accurate as of March 11, 2023 11:24 AM. If you have any questions, ask your nurse or doctor.          STOP taking these medications    methocarbamol 500 MG tablet Commonly known as: ROBAXIN Stopped by: Reather Littler   nicotine 7 mg/24hr patch Commonly known as: Nicoderm CQ Stopped by: Reather Littler       TAKE these medications    amLODipine 2.5 MG tablet Commonly known as: NORVASC Tome 1 tableta (2.5 mg en total) por va oral diariamente. (Take 1 tablet (2.5 mg total) by mouth daily.)   atorvastatin 20 MG tablet Commonly known as: LIPITOR Tome 1 tableta (20 mg en total) por va oral diariamente. (Take 1 tablet (20 mg total) by mouth daily.)   ibuprofen 800 MG tablet Commonly known  as: ADVIL Take 1 tablet (800 mg total) by mouth 3 (three) times daily.   levothyroxine 125 MCG tablet Commonly known as: SYNTHROID Tome 1 tableta (125 mcg en total) por va oral diariamente antes de desayunar. (Take 1 tablet (125 mcg total) by mouth daily before breakfast.)   metFORMIN 500 MG tablet Commonly known as: GLUCOPHAGE Take 1 tablet (500 mg total) by mouth 2 (two) times daily with a meal.   olopatadine 0.1 % ophthalmic solution Commonly known as: PATANOL Place 1 drop into both eyes 2 (two) times daily.            Past Medical History:  Diagnosis Date   Chest pain    Headache    Hypertension    SOB (shortness of breath)    Thyroid disease    hyper   Tobacco user    Tuberculosis    as a child    Past Surgical History:  Procedure Laterality Date   CESAREAN SECTION  07/02/2011   Procedure: CESAREAN SECTION;  Surgeon: Roseanna Rainbow, MD;  Location: [facility];  Service: Gynecology;  Laterality: N/A;   CHOLECYSTECTOMY  2002    Family History  Problem Relation Age of Onset   Diabetes Mother    Hypertension Father    Thyroid disease Sister    Thyroid disease Paternal Grandmother    Thyroid disease Brother    Breast cancer Neg Hx     Social History:  reports that she has been smoking cigars and cigarettes. She has a 5 pack-year smoking history. She has quit using smokeless tobacco. She reports that she does not drink alcohol and does not use drugs.  Allergies: No Known Allergies   Review of Systems      Examination:   BP 130/84   Pulse 61   Ht 5\' 3"  (1.6 m)   Wt 187 lb 12.8 oz (85.2 kg)   SpO2 96%   BMI 33.27 kg/m      Assessment/Plan:   Graves' disease, treated with radioactive iodine in July 2018 and again in 12/2019  She has had hypothyroidism since 02/2020  She has been taking 125 mcg levothyroxine with no recurrence of hypothyroidism  TSH is higher but likely from her missing some doses of levothyroxine She has fatigue  but this is  not consistent  She will not take her levothyroxine consistently every day and discussed that if she misses a dose in the morning she can take it in the afternoon Follow-up TSH in about a month with her PCP   There are no Patient Instructions on file for this visit.   Reather Littler 03/11/2023, 11:24 AM    Note: This office note was prepared with Dragon voice recognition system technology. Any transcriptional errors that result from this process are unintentional.

## 2023-03-11 NOTE — Telephone Encounter (Signed)
Patient requesting that her RX have enough refills until she is seen at her next visit.  RX was sent to pharmacy with only 1 refill.

## 2023-03-14 ENCOUNTER — Other Ambulatory Visit: Payer: Self-pay

## 2023-03-14 MED ORDER — LEVOTHYROXINE SODIUM 125 MCG PO TABS
125.0000 ug | ORAL_TABLET | Freq: Every day | ORAL | 11 refills | Status: DC
  Filled 2023-03-14 – 2023-04-07 (×2): qty 30, 30d supply, fill #0
  Filled 2023-05-10 (×2): qty 30, 30d supply, fill #1
  Filled 2023-05-11: qty 90, 90d supply, fill #1
  Filled 2023-08-11 (×2): qty 90, 90d supply, fill #2
  Filled 2023-11-04: qty 90, 90d supply, fill #3

## 2023-03-14 NOTE — Telephone Encounter (Signed)
Done

## 2023-04-07 ENCOUNTER — Other Ambulatory Visit: Payer: Self-pay

## 2023-04-08 ENCOUNTER — Other Ambulatory Visit: Payer: Self-pay

## 2023-04-13 ENCOUNTER — Other Ambulatory Visit (HOSPITAL_COMMUNITY)
Admission: RE | Admit: 2023-04-13 | Discharge: 2023-04-13 | Disposition: A | Discharge status: Home / Self Care | Payer: Self-pay | Source: Ambulatory Visit | Attending: Family Medicine | Admitting: Family Medicine

## 2023-04-13 ENCOUNTER — Encounter: Payer: Self-pay | Admitting: Family Medicine

## 2023-04-13 ENCOUNTER — Other Ambulatory Visit: Payer: Self-pay

## 2023-04-13 ENCOUNTER — Ambulatory Visit: Payer: Self-pay | Attending: Family Medicine | Admitting: Family Medicine

## 2023-04-13 VITALS — BP 121/81 | HR 67 | Ht 63.0 in | Wt 188.8 lb

## 2023-04-13 DIAGNOSIS — Z1231 Encounter for screening mammogram for malignant neoplasm of breast: Secondary | ICD-10-CM

## 2023-04-13 DIAGNOSIS — Z124 Encounter for screening for malignant neoplasm of cervix: Secondary | ICD-10-CM | POA: Insufficient documentation

## 2023-04-13 DIAGNOSIS — Z0001 Encounter for general adult medical examination with abnormal findings: Secondary | ICD-10-CM

## 2023-04-13 DIAGNOSIS — E0500 Thyrotoxicosis with diffuse goiter without thyrotoxic crisis or storm: Secondary | ICD-10-CM

## 2023-04-13 DIAGNOSIS — M5440 Lumbago with sciatica, unspecified side: Secondary | ICD-10-CM

## 2023-04-13 DIAGNOSIS — Z23 Encounter for immunization: Secondary | ICD-10-CM

## 2023-04-13 MED ORDER — IBUPROFEN 800 MG PO TABS
800.0000 mg | ORAL_TABLET | Freq: Two times a day (BID) | ORAL | 1 refills | Status: DC | PRN
  Filled 2023-04-13: qty 60, 30d supply, fill #0

## 2023-04-13 NOTE — Patient Instructions (Signed)

## 2023-04-13 NOTE — Progress Notes (Signed)
Subjective:  Patient ID: Tracy Carson, female    DOB: 1981/07/17  Age: 42 y.o. MRN: [id number]  CC: Gynecologic Exam   HPI Lataysha Zein is a 42 y.o. year old female with a history of treatment induced hypothyroidism (radioactive iodine treatment of Graves' disease in 2018 and 2021), migraines, anxiety, and depression.  She is followed by endocrinology for treatment of her hyperthyroidism. Migraines were previously managed by Neurology but she has not seen them in a while.   Interval History: Discussed the use of AI scribe software for clinical note transcription with the patient, who gave verbal consent to proceed.  She has been experiencing hip pain, which she describes as similar to sciatica, and weakness in her hands. She has been managing the pain with 800mg  ibuprofen and requests a refill. She also reports a single sexual partner and consents to STD testing in addition to her regular Pap smear.   Last mammogram in 11/26/2021 was negative for malignancy Last Pap smear from 06/2019 revealed NILM, HPV negative       Past Medical History:  Diagnosis Date   Chest pain    Headache    Hypertension    SOB (shortness of breath)    Thyroid disease    hyper   Tobacco user    Tuberculosis    as a child    Past Surgical History:  Procedure Laterality Date   CESAREAN SECTION  07/02/2011   Procedure: CESAREAN SECTION;  Surgeon: Roseanna Rainbow, MD;  Location: [facility];  Service: Gynecology;  Laterality: N/A;   CHOLECYSTECTOMY  2002    Family History  Problem Relation Age of Onset   Diabetes Mother    Hypertension Father    Thyroid disease Sister    Thyroid disease Paternal Grandmother    Thyroid disease Brother    Breast cancer Neg Hx     Social History   Socioeconomic History   Marital status: Married    Spouse name: Blossom Hoops   Number of children: 4   Years of education: 7   Highest education level: 7th grade  Occupational History    Comment: na   Tobacco Use   Smoking status: Some Days    Current packs/day: 5.00    Average packs/day: 5.0 packs/day for 1 year (5.0 ttl pk-yrs)    Types: Cigars, Cigarettes   Smokeless tobacco: Former   Tobacco comments:    03/28/19 7-8/day  Vaping Use   Vaping status: Never Used  Substance and Sexual Activity   Alcohol use: No    Comment: quit 10/2018   Drug use: No   Sexual activity: Yes    Birth control/protection: None  Other Topics Concern   Not on file  Social History Narrative   Lives with spouse, 4 daughters   Some caffeine   Social Determinants of Health   Financial Resource Strain: Not on file  Food Insecurity: No Food Insecurity (11/26/2021)   Hunger Vital Sign    Worried About Running Out of Food in the Last Year: Never true    Ran Out of Food in the Last Year: Never true  Transportation Needs: No Transportation Needs (11/26/2021)   PRAPARE - Administrator, Civil Service (Medical): No    Lack of Transportation (Non-Medical): No  Physical Activity: Not on file  Stress: Not on file  Social Connections: Not on file    No Known Allergies  Outpatient Medications Prior to Visit  Medication Sig Dispense Refill  amLODipine (NORVASC) 2.5 MG tablet Take 1 tablet (2.5 mg total) by mouth daily. 90 tablet 1   atorvastatin (LIPITOR) 20 MG tablet Take 1 tablet (20 mg total) by mouth daily. 90 tablet 1   levothyroxine (SYNTHROID) 125 MCG tablet Take 1 tablet (125 mcg total) by mouth daily before breakfast. 30 tablet 11   metFORMIN (GLUCOPHAGE) 500 MG tablet Take 1 tablet (500 mg total) by mouth 2 (two) times daily with a meal. 180 tablet 1   olopatadine (PATANOL) 0.1 % ophthalmic solution Place 1 drop into both eyes 2 (two) times daily. 5 mL 1   ibuprofen (ADVIL) 800 MG tablet Take 1 tablet (800 mg total) by mouth 3 (three) times daily. 21 tablet 0   No facility-administered medications prior to visit.     ROS Review of Systems  Constitutional:  Negative for activity  change and appetite change.  HENT:  Negative for sinus pressure and sore throat.   Respiratory:  Negative for chest tightness, shortness of breath and wheezing.   Cardiovascular:  Negative for chest pain and palpitations.  Gastrointestinal:  Negative for abdominal distention, abdominal pain and constipation.  Genitourinary: Negative.   Musculoskeletal:        See HPI  Psychiatric/Behavioral:  Negative for behavioral problems and dysphoric mood.     Objective:  BP 121/81   Pulse 67   Ht 5\' 3"  (1.6 m)   Wt 188 lb 12.8 oz (85.6 kg)   SpO2 98%   BMI 33.44 kg/m      04/13/2023    9:53 AM 03/11/2023   11:09 AM 03/10/2023   11:18 AM  BP/Weight  Systolic BP 121 130 121  Diastolic BP 81 84 78  Wt. (Lbs) 188.8 187.8 187.2  BMI 33.44 kg/m2 33.27 kg/m2 33.16 kg/m2      Physical Exam Exam conducted with a chaperone present.  Constitutional:      General: She is not in acute distress.    Appearance: She is well-developed. She is not diaphoretic.  HENT:     Head: Normocephalic.     Right Ear: External ear normal.     Left Ear: External ear normal.     Nose: Nose normal.  Eyes:     Conjunctiva/sclera: Conjunctivae normal.     Pupils: Pupils are equal, round, and reactive to light.  Neck:     Vascular: No JVD.  Cardiovascular:     Rate and Rhythm: Normal rate and regular rhythm.     Heart sounds: Normal heart sounds. No murmur heard.    No gallop.  Pulmonary:     Effort: Pulmonary effort is normal. No respiratory distress.     Breath sounds: Normal breath sounds. No wheezing or rales.  Chest:     Chest wall: No tenderness.  Breasts:    Right: Normal. No mass, nipple discharge or tenderness.     Left: Normal. No mass, nipple discharge or tenderness.  Abdominal:     General: Bowel sounds are normal. There is no distension.     Palpations: Abdomen is soft. There is no mass.     Tenderness: There is no abdominal tenderness.     Hernia: There is no hernia in the left inguinal  area or right inguinal area.  Genitourinary:    General: Normal vulva.     Pubic Area: No rash.      Labia:        Right: No rash.        Left: No rash.  Vagina: Normal.     Cervix: Normal.     Uterus: Normal.      Adnexa: Right adnexa normal and left adnexa normal.       Right: No tenderness.         Left: No tenderness.    Musculoskeletal:        General: No tenderness. Normal range of motion.     Cervical back: Normal range of motion. No tenderness.     Right lower leg: No edema.     Left lower leg: No edema.  Lymphadenopathy:     Upper Body:     Right upper body: No supraclavicular or axillary adenopathy.     Left upper body: No supraclavicular or axillary adenopathy.  Skin:    General: Skin is warm and dry.  Neurological:     Mental Status: She is alert and oriented to person, place, and time.     Deep Tendon Reflexes: Reflexes are normal and symmetric.  Psychiatric:        Mood and Affect: Mood normal.        Latest Ref Rng & Units 08/18/2022   10:07 PM 04/24/2022    2:52 AM 06/04/2020   10:38 AM  CMP  Glucose 70 - 99 mg/dL 086  578  88   BUN 6 - 20 mg/dL 8  5  7    Creatinine 0.44 - 1.00 mg/dL 4.69  6.29  5.28   Sodium 135 - 145 mmol/L 135  139  138   Potassium 3.5 - 5.1 mmol/L 3.5  3.4  4.3   Chloride 98 - 111 mmol/L 101  105  103   CO2 22 - 32 mmol/L 24  24  21    Calcium 8.9 - 10.3 mg/dL 8.5  9.2  9.1     Lipid Panel     Component Value Date/Time   CHOL 112 11/15/2016 1038   TRIG 162 (H) 11/15/2016 1038   HDL 24 (L) 11/15/2016 1038   CHOLHDL 4.7 (H) 11/15/2016 1038   LDLCALC 56 11/15/2016 1038    CBC    Component Value Date/Time   WBC 11.4 (H) 08/18/2022 2207   RBC 4.36 08/18/2022 2207   HGB 14.7 08/18/2022 2207   HGB 13.3 11/15/2016 1038   HCT 41.1 08/18/2022 2207   HCT 38.1 11/15/2016 1038   PLT 219 08/18/2022 2207   PLT 185 11/15/2016 1038   MCV 94.3 08/18/2022 2207   MCV 81 11/15/2016 1038   MCH 33.7 08/18/2022 2207   MCHC 35.8  08/18/2022 2207   RDW 12.0 08/18/2022 2207   RDW 13.5 11/15/2016 1038   LYMPHSABS 1.7 05/28/2019 1332   LYMPHSABS 2.7 11/15/2016 1038   MONOABS 0.3 05/28/2019 1332   EOSABS 0.2 05/28/2019 1332   EOSABS 0.2 11/15/2016 1038   BASOSABS 0.0 05/28/2019 1332   BASOSABS 0.0 11/15/2016 1038    Lab Results  Component Value Date   HGBA1C 6.5 03/10/2023   Lab Results  Component Value Date   TSH 7.23 (H) 03/01/2023     Assessment & Plan:  Annual Physical Exam Counseled on 150 minutes of exercise per week, healthy eating (including decreased daily intake of saturated fats, cholesterol, added sugars, sodium), STI prevention, routine healthcare maintenance.     Graves' Disease Plan for blood work to monitor thyroid function and results will be sent to the endocrinologist. -Order thyroid function tests.  Type 2 diabetes mellitus -Will check microalbumin   Influenza Vaccination Patient agreed to receive the flu  shot. -Administer influenza vaccine.  Cervical Cancer Screening Pap smear performed, with patient consent for STD testing. -Order Pap smear and STD testing.  Sciatica and Hand Weakness Patient reports hip pain suggestive of sciatica and decreased hand strength. Requested refill of Ibuprofen 800mg  for pain. -Refill Ibuprofen 800mg  as needed for pain.  General Health Maintenance / Followup Plans -Call patient with results of Pap smear and blood work. -Follow up after lab results are available.          Meds ordered this encounter  Medications   ibuprofen (ADVIL) 800 MG tablet    Sig: Take 1 tablet (800 mg total) by mouth 2 (two) times daily as needed.    Dispense:  60 tablet    Refill:  1    Follow-up: Return in about 6 months (around 10/11/2023) for Chronic medical conditions.       [name], MD, FAAFP. [facility] [place], [place] [phone]   04/13/2023, 11:28 AM

## 2023-04-14 LAB — CBC WITH DIFFERENTIAL/PLATELET
Basophils Absolute: 0.1 10*3/uL (ref 0.0–0.2)
Basos: 1 %
EOS (ABSOLUTE): 0.2 10*3/uL (ref 0.0–0.4)
Eos: 2 %
Hematocrit: 42.8 % (ref 34.0–46.6)
Hemoglobin: 14.3 g/dL (ref 11.1–15.9)
Immature Grans (Abs): 0 10*3/uL (ref 0.0–0.1)
Immature Granulocytes: 0 %
Lymphocytes Absolute: 2.5 10*3/uL (ref 0.7–3.1)
Lymphs: 26 %
MCH: 32.3 pg (ref 26.6–33.0)
MCHC: 33.4 g/dL (ref 31.5–35.7)
MCV: 97 fL (ref 79–97)
Monocytes Absolute: 0.5 10*3/uL (ref 0.1–0.9)
Monocytes: 5 %
Neutrophils Absolute: 6.4 10*3/uL (ref 1.4–7.0)
Neutrophils: 66 %
Platelets: 208 10*3/uL (ref 150–450)
RBC: 4.43 x10E6/uL (ref 3.77–5.28)
RDW: 11.9 % (ref 11.7–15.4)
WBC: 9.7 10*3/uL (ref 3.4–10.8)

## 2023-04-14 LAB — CERVICOVAGINAL ANCILLARY ONLY
Bacterial Vaginitis (gardnerella): NEGATIVE
Candida Glabrata: POSITIVE — AB
Candida Vaginitis: NEGATIVE
Chlamydia: NEGATIVE
Comment: NEGATIVE
Comment: NEGATIVE
Comment: NEGATIVE
Comment: NEGATIVE
Comment: NEGATIVE
Comment: NORMAL
Neisseria Gonorrhea: NEGATIVE
Trichomonas: NEGATIVE

## 2023-04-14 LAB — LP+NON-HDL CHOLESTEROL
Cholesterol, Total: 104 mg/dL (ref 100–199)
HDL: 31 mg/dL — ABNORMAL LOW (ref >39)
LDL Chol Calc (NIH): 52 mg/dL (ref 0–99)
Total Non-HDL-Chol (LDL+VLDL): 73 mg/dL (ref 0–129)
Triglycerides: 117 mg/dL (ref 0–149)
VLDL Cholesterol Cal: 21 mg/dL (ref 5–40)

## 2023-04-14 LAB — MICROALBUMIN / CREATININE URINE RATIO
Creatinine, Urine: 122.2 mg/dL
Microalb/Creat Ratio: 49 mg/g creat — ABNORMAL HIGH (ref 0–29)
Microalbumin, Urine: 60.2 ug/mL

## 2023-04-14 LAB — CMP14+EGFR
ALT: 15 IU/L (ref 0–32)
AST: 15 IU/L (ref 0–40)
Albumin: 4.2 g/dL (ref 3.9–4.9)
Alkaline Phosphatase: 116 IU/L (ref 44–121)
BUN/Creatinine Ratio: 9 (ref 9–23)
BUN: 6 mg/dL (ref 6–24)
Bilirubin Total: 0.2 mg/dL (ref 0.0–1.2)
CO2: 21 mmol/L (ref 20–29)
Calcium: 9 mg/dL (ref 8.7–10.2)
Chloride: 104 mmol/L (ref 96–106)
Creatinine, Ser: 0.67 mg/dL (ref 0.57–1.00)
Globulin, Total: 2.6 g/dL (ref 1.5–4.5)
Glucose: 137 mg/dL — ABNORMAL HIGH (ref 70–99)
Potassium: 4.4 mmol/L (ref 3.5–5.2)
Sodium: 139 mmol/L (ref 134–144)
Total Protein: 6.8 g/dL (ref 6.0–8.5)
eGFR: 113 mL/min/{1.73_m2} (ref >59)

## 2023-04-14 LAB — TSH: TSH: 1.33 u[IU]/mL (ref 0.450–4.500)

## 2023-04-14 LAB — T4, FREE: Free T4: 1.65 ng/dL (ref 0.82–1.77)

## 2023-04-14 LAB — T3: T3, Total: 124 ng/dL (ref 71–180)

## 2023-04-18 ENCOUNTER — Emergency Department (HOSPITAL_COMMUNITY)
Admission: EM | Admit: 2023-04-18 | Discharge: 2023-04-18 | Disposition: A | Discharge status: Home / Self Care | Payer: Self-pay | Attending: Emergency Medicine | Admitting: Emergency Medicine

## 2023-04-18 ENCOUNTER — Emergency Department (HOSPITAL_COMMUNITY): Payer: Self-pay

## 2023-04-18 ENCOUNTER — Other Ambulatory Visit: Payer: Self-pay | Admitting: Family Medicine

## 2023-04-18 ENCOUNTER — Encounter (HOSPITAL_COMMUNITY): Payer: Self-pay

## 2023-04-18 ENCOUNTER — Other Ambulatory Visit: Payer: Self-pay

## 2023-04-18 DIAGNOSIS — Z79899 Other long term (current) drug therapy: Secondary | ICD-10-CM | POA: Insufficient documentation

## 2023-04-18 DIAGNOSIS — Z20822 Contact with and (suspected) exposure to covid-19: Secondary | ICD-10-CM | POA: Insufficient documentation

## 2023-04-18 DIAGNOSIS — R0789 Other chest pain: Secondary | ICD-10-CM | POA: Insufficient documentation

## 2023-04-18 DIAGNOSIS — I10 Essential (primary) hypertension: Secondary | ICD-10-CM | POA: Insufficient documentation

## 2023-04-18 LAB — CBC
HCT: 40.8 % (ref 36.0–46.0)
Hemoglobin: 14.3 g/dL (ref 12.0–15.0)
MCH: 32.5 pg (ref 26.0–34.0)
MCHC: 35 g/dL (ref 30.0–36.0)
MCV: 92.7 fL (ref 80.0–100.0)
Platelets: 207 10*3/uL (ref 150–400)
RBC: 4.4 MIL/uL (ref 3.87–5.11)
RDW: 12 % (ref 11.5–15.5)
WBC: 11.1 10*3/uL — ABNORMAL HIGH (ref 4.0–10.5)
nRBC: 0 % (ref 0.0–0.2)

## 2023-04-18 LAB — CYTOLOGY - PAP
Chlamydia: NEGATIVE
Comment: NEGATIVE
Comment: NEGATIVE
Comment: NEGATIVE
Comment: NORMAL
Diagnosis: NEGATIVE
High risk HPV: NEGATIVE
Neisseria Gonorrhea: NEGATIVE
Trichomonas: NEGATIVE

## 2023-04-18 LAB — BASIC METABOLIC PANEL
Anion gap: 9 (ref 5–15)
BUN: 7 mg/dL (ref 6–20)
CO2: 20 mmol/L — ABNORMAL LOW (ref 22–32)
Calcium: 8.6 mg/dL — ABNORMAL LOW (ref 8.9–10.3)
Chloride: 107 mmol/L (ref 98–111)
Creatinine, Ser: 0.57 mg/dL (ref 0.44–1.00)
GFR, Estimated: >60 mL/min (ref >60)
Glucose, Bld: 128 mg/dL — ABNORMAL HIGH (ref 70–99)
Potassium: 3.7 mmol/L (ref 3.5–5.1)
Sodium: 136 mmol/L (ref 135–145)

## 2023-04-18 LAB — RESP PANEL BY RT-PCR (RSV, FLU A&B, COVID)  RVPGX2
Influenza A by PCR: NEGATIVE
Influenza B by PCR: NEGATIVE
Resp Syncytial Virus by PCR: NEGATIVE
SARS Coronavirus 2 by RT PCR: NEGATIVE

## 2023-04-18 LAB — HCG, SERUM, QUALITATIVE: Preg, Serum: NEGATIVE

## 2023-04-18 LAB — TROPONIN I (HIGH SENSITIVITY)
Troponin I (High Sensitivity): <2 ng/L (ref <18)
Troponin I (High Sensitivity): 5 ng/L (ref <18)

## 2023-04-18 LAB — D-DIMER, QUANTITATIVE: D-Dimer, Quant: <0.27 ug/mL-FEU (ref 0.00–0.50)

## 2023-04-18 MED ORDER — FLUCONAZOLE 150 MG PO TABS
150.0000 mg | ORAL_TABLET | Freq: Once | ORAL | 0 refills | Status: AC
  Filled 2023-04-18: qty 1, 1d supply, fill #0

## 2023-04-18 MED ORDER — KETOROLAC TROMETHAMINE 30 MG/ML IJ SOLN
30.0000 mg | Freq: Once | INTRAMUSCULAR | Status: AC
  Administered 2023-04-18: 30 mg via INTRAMUSCULAR
  Filled 2023-04-18: qty 1

## 2023-04-18 MED ORDER — IBUPROFEN 600 MG PO TABS
600.0000 mg | ORAL_TABLET | Freq: Four times a day (QID) | ORAL | 0 refills | Status: DC | PRN
  Filled 2023-04-18: qty 30, 8d supply, fill #0

## 2023-04-18 NOTE — Discharge Instructions (Signed)
You were seen today for chest pain.  Your workup today is reassuring including heart testing.  Take ibuprofen for pain.

## 2023-04-18 NOTE — ED Triage Notes (Signed)
Pt came in via POV d/t beginning to have pain in her chest & difficulty breathing when she woke up. A/Ox4, rates her pain 9/10 & pain does radiate into her neck.

## 2023-04-18 NOTE — ED Provider Notes (Signed)
[place] EMERGENCY DEPARTMENT AT [facility] Provider Note   CSN: [id number] Arrival date & time: 04/18/23  1031     History  Chief Complaint  Patient presents with   Chest Pain   Difficulty Breathing    Tracy Carson is a 42 y.o. female.  HPI     This a 42 year old female who presents with chest pain.  Patient reports that today she woke up and began to have chest pain.  It is mostly over the anterior chest and nonradiating.  She reports pain with breathing leaning forward and leaning backward.  She states that it radiates into her neck.  She has not had a review of recent fevers but has developed a dry cough.  No known sick contacts.  Denies any history of blood clots.  Home Medications Prior to Admission medications   Medication Sig Start Date End Date Taking? Authorizing Provider  ibuprofen (ADVIL) 600 MG tablet Take 1 tablet (600 mg total) by mouth every 6 (six) hours as needed. 04/18/23  Yes Joao Mccurdy, Mayer Masker, MD  amLODipine (NORVASC) 2.5 MG tablet Take 1 tablet (2.5 mg total) by mouth daily. 03/10/23   [name], MD  atorvastatin (LIPITOR) 20 MG tablet Take 1 tablet (20 mg total) by mouth daily. 03/10/23   [name], MD  fluconazole (DIFLUCAN) 150 MG tablet Take 1 tablet (150 mg total) by mouth once for 1 dose. 04/18/23 04/19/23  [name], MD  ibuprofen (ADVIL) 800 MG tablet Take 1 tablet (800 mg total) by mouth 2 (two) times daily as needed. 04/13/23   [name], MD  levothyroxine (SYNTHROID) 125 MCG tablet Take 1 tablet (125 mcg total) by mouth daily before breakfast. 03/14/23 03/13/24  Reather Littler, MD  metFORMIN (GLUCOPHAGE) 500 MG tablet Take 1 tablet (500 mg total) by mouth 2 (two) times daily with a meal. 03/10/23   [name], MD  olopatadine (PATANOL) 0.1 % ophthalmic solution Place 1 drop into both eyes 2 (two) times daily. 03/10/23   [name], MD      Allergies    Patient has no known allergies.    Review of Systems    Review of Systems  Constitutional:  Negative for fever.  Respiratory:  Negative for cough and shortness of breath.   Cardiovascular:  Positive for chest pain. Negative for leg swelling.  Genitourinary:  Negative for dysuria.  All other systems reviewed and are negative.   Physical Exam Updated Vital Signs BP 118/66 (BP Location: Left Arm)   Pulse 66   Temp 98.8 F (37.1 C)   Resp 16   Ht 1.6 m (5\' 3" )   Wt 85.7 kg   SpO2 99%   BMI 33.48 kg/m  Physical Exam Vitals and nursing note reviewed.  Constitutional:      Appearance: She is well-developed.  HENT:     Head: Normocephalic and atraumatic.  Eyes:     Pupils: Pupils are equal, round, and reactive to light.  Cardiovascular:     Rate and Rhythm: Normal rate and regular rhythm.     Heart sounds: Normal heart sounds.  Pulmonary:     Effort: Pulmonary effort is normal. No respiratory distress.     Breath sounds: No wheezing.  Abdominal:     General: Bowel sounds are normal.     Palpations: Abdomen is soft.  Musculoskeletal:     Cervical back: Neck supple.  Skin:    General: Skin is warm and dry.  Neurological:  General: No focal deficit present.     Mental Status: She is alert and oriented to person, place, and time.  Psychiatric:        Mood and Affect: Mood normal.     ED Results / Procedures / Treatments   Labs (all labs ordered are listed, but only abnormal results are displayed) Labs Reviewed  BASIC METABOLIC PANEL - Abnormal; Notable for the following components:      Result Value   CO2 20 (*)    Glucose, Bld 128 (*)    Calcium 8.6 (*)    All other components within normal limits  CBC - Abnormal; Notable for the following components:   WBC 11.1 (*)    All other components within normal limits  RESP PANEL BY RT-PCR (RSV, FLU A&B, COVID)  RVPGX2  HCG, SERUM, QUALITATIVE  D-DIMER, QUANTITATIVE  TROPONIN I (HIGH SENSITIVITY)  TROPONIN I (HIGH SENSITIVITY)    EKG EKG  Interpretation Date/Time:  Monday April 18 2023 10:41:46 EDT Ventricular Rate:  73 PR Interval:  130 QRS Duration:  92 QT Interval:  408 QTC Calculation: 449 R Axis:   94  Text Interpretation: Normal sinus rhythm Rightward axis Cannot rule out Anterior infarct , age undetermined Abnormal ECG When compared with ECG of 18-Apr-2023 10:41, PREVIOUS ECG IS PRESENT Confirmed by Ross Marcus ([id number]) on 04/18/2023 9:09:47 PM  Radiology DG Chest 2 View  Result Date: 04/18/2023 CLINICAL DATA:  Chest pain and shortness of breath EXAM: CHEST - 2 VIEW COMPARISON:  08/18/2022 FINDINGS: The heart size and mediastinal contours are within normal limits. Both lungs are clear. The visualized skeletal structures are unremarkable. IMPRESSION: No active cardiopulmonary disease. Electronically Signed   By: Judie Petit.  Shick M.D.   On: 04/18/2023 12:50    Procedures Procedures    Medications Ordered in ED Medications  ketorolac (TORADOL) 30 MG/ML injection 30 mg (30 mg Intramuscular Given 04/18/23 2119)    ED Course/ Medical Decision Making/ A&P                                 Medical Decision Making Amount and/or Complexity of Data Reviewed Labs: ordered. Radiology: ordered.  Risk Prescription drug management.   This patient presents to the ED for concern of chest pain, this involves an extensive number of treatment options, and is a complaint that carries with it a high risk of complications and morbidity.  I considered the following differential and admission for this acute, potentially life threatening condition.  The differential diagnosis includes ACS, PE, pneumothorax, pneumonia, bronchitis, PE  MDM:    This a 42 year old female who presents with chest pain.  Somewhat atypical in nature.  She is overall nontoxic and vital signs are reassuring.  Patient was given a dose of Toradol.  EKG shows no evidence of acute ischemia or arrhythmia.  Chest x-ray without pneumothorax or pneumonia.  Troponin  negative.  D-dimer negative.  COVID and influenza negative.  Overall workup is reassuring.  Will trial anti-inflammatories for costochondritis or inflammatory pain.  (Labs, imaging, consults)  Labs: I Ordered, and personally interpreted labs.  The pertinent results include: CBC, BMP, troponin, D-dimer, COVID  Imaging Studies ordered: I ordered imaging studies including chest x-ray I independently visualized and interpreted imaging. I agree with the radiologist interpretation  Additional history obtained from chart review.  External records from outside source obtained and reviewed including prior evaluations  Cardiac Monitoring: The patient was not  maintained on a cardiac monitor.  If on the cardiac monitor, I personally viewed and interpreted the cardiac monitored which showed an underlying rhythm of: N/A  Reevaluation: After the interventions noted above, I reevaluated the patient and found that they have :stayed the same  Social Determinants of Health:  lives independently  Disposition: Discharge  Co morbidities that complicate the patient evaluation  Past Medical History:  Diagnosis Date   Chest pain    Headache    Hypertension    SOB (shortness of breath)    Thyroid disease    hyper   Tobacco user    Tuberculosis    as a child     Medicines Meds ordered this encounter  Medications   ketorolac (TORADOL) 30 MG/ML injection 30 mg   ibuprofen (ADVIL) 600 MG tablet    Sig: Take 1 tablet (600 mg total) by mouth every 6 (six) hours as needed.    Dispense:  30 tablet    Refill:  0    I have reviewed the patients home medicines and have made adjustments as needed  Problem List / ED Course: Problem List Items Addressed This Visit   None Visit Diagnoses     Atypical chest pain    -  Primary                   Final Clinical Impression(s) / ED Diagnoses Final diagnoses:  Atypical chest pain    Rx / DC Orders ED Discharge Orders          Ordered     ibuprofen (ADVIL) 600 MG tablet  Every 6 hours PRN        04/18/23 2241              Shon Baton, MD 04/18/23 2247

## 2023-04-19 ENCOUNTER — Other Ambulatory Visit: Payer: Self-pay

## 2023-04-22 ENCOUNTER — Telehealth: Payer: Self-pay | Admitting: *Deleted

## 2023-04-22 NOTE — Telephone Encounter (Signed)
Interpreter: [phone] Patient returned call regarding lab results and notified: Please inform her that her  thyroid labs are normal (continue to follow-up with endocrine); cholesterol is normal but the good cholesterol (HDL) is slightly low.  This can be increased by exercising.  Kidney and liver functions are normal.  She is not anemic.  Pap smear is normal but she does have presence of a yeast infection I have sent a prescription to the pharmacy for her.  She does have slight protein in her urine likely from diabetes mellitus.  Diabetes control will be beneficial in this regard.   Patient is requesting Rx for True Metrix test strips- not listed on current medication list

## 2023-04-25 ENCOUNTER — Other Ambulatory Visit: Payer: Self-pay

## 2023-05-10 ENCOUNTER — Other Ambulatory Visit: Payer: Self-pay

## 2023-05-11 ENCOUNTER — Other Ambulatory Visit: Payer: Self-pay

## 2023-05-11 ENCOUNTER — Encounter (HOSPITAL_COMMUNITY): Payer: Self-pay | Admitting: Emergency Medicine

## 2023-05-11 ENCOUNTER — Ambulatory Visit (INDEPENDENT_AMBULATORY_CARE_PROVIDER_SITE_OTHER): Payer: Self-pay

## 2023-05-11 ENCOUNTER — Ambulatory Visit (HOSPITAL_COMMUNITY)
Admission: EM | Admit: 2023-05-11 | Discharge: 2023-05-11 | Disposition: A | Discharge status: Home / Self Care | Payer: Self-pay | Attending: Emergency Medicine | Admitting: Emergency Medicine

## 2023-05-11 DIAGNOSIS — R051 Acute cough: Secondary | ICD-10-CM

## 2023-05-11 DIAGNOSIS — J069 Acute upper respiratory infection, unspecified: Secondary | ICD-10-CM

## 2023-05-11 DIAGNOSIS — R059 Cough, unspecified: Secondary | ICD-10-CM

## 2023-05-11 MED ORDER — METHYLPREDNISOLONE SODIUM SUCC 125 MG IJ SOLR
INTRAMUSCULAR | Status: AC
  Filled 2023-05-11: qty 2

## 2023-05-11 MED ORDER — METHYLPREDNISOLONE SODIUM SUCC 125 MG IJ SOLR
60.0000 mg | Freq: Once | INTRAMUSCULAR | Status: AC
  Administered 2023-05-11: 60 mg via INTRAMUSCULAR

## 2023-05-11 MED ORDER — AMOXICILLIN-POT CLAVULANATE 875-125 MG PO TABS: 1.0000 | ORAL_TABLET | Freq: Two times a day (BID) | ORAL | 0 refills | Status: DC

## 2023-05-11 MED ORDER — PROMETHAZINE-DM 6.25-15 MG/5ML PO SYRP: 5.0000 mL | ORAL_SOLUTION | Freq: Four times a day (QID) | ORAL | 0 refills | Status: DC | PRN

## 2023-05-11 MED ORDER — PREDNISONE 20 MG PO TABS: 40.0000 mg | ORAL_TABLET | Freq: Every day | ORAL | 0 refills | Status: AC

## 2023-05-11 NOTE — ED Provider Notes (Signed)
[facility]    CSN: [id number] Arrival date & time: 05/11/23  1627      History   Chief Complaint Chief Complaint  Patient presents with   Cough   Nasal Congestion    HPI Tracy Carson is a 42 y.o. female.   Patient presents to clinic for nasal congestion and cough for the past 3 weeks.  Initially symptoms started as rhinorrhea and now her sinuses feel clogged.  Cough is dry and nonproductive, occasionally productive with phlegm and she has had a few episodes of posttussive emesis.  She reports shortness of breath with coughing fits.  Does not think she has been having a fever.  Has no abdominal pain, nausea or diarrhea.  She has tried amoxicillin, NyQuil, Tylenol, and cough drops.   Denies history of asthma.  She does smoke cigarettes daily.   The history is provided by the patient and medical records.  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   History of blurry vision 05/20/2014   History of itching of eye 05/20/2014   Other fatigue 05/20/2014   Tobacco dependence 05/20/2014   Palpitations 05/20/2014   Abdominal pain 11/03/2012   Proctitis 11/03/2012   Tachycardia 11/03/2012   Hematochezia 11/03/2012   Graves disease 11/03/2012   Anemia 11/03/2012   Breech presentation 07/02/2011   Cesarean delivery delivered 07/02/2011    Past Surgical History:  Procedure Laterality Date   CESAREAN SECTION  07/02/2011   Procedure: CESAREAN SECTION;  Surgeon: Roseanna Rainbow, MD;  Location: [facility];  Service: Gynecology;   Laterality: N/A;   CHOLECYSTECTOMY  2002    OB History     Gravida  4   Para  4   Term  4   Preterm      AB      Living  4      SAB      IAB      Ectopic      Multiple      Live Births  3            Home Medications    Prior to Admission medications   Medication Sig Start Date End Date Taking? Authorizing Provider  amoxicillin-clavulanate (AUGMENTIN) 875-125 MG tablet Take 1 tablet by mouth every 12 (twelve) hours. 05/11/23  Yes Rinaldo Ratel, [place] N, FNP  predniSONE (DELTASONE) 20 MG tablet Take 2 tablets (40 mg total) by mouth daily for 5 days. 05/11/23 05/16/23 Yes Rinaldo Ratel, [place] N, FNP  promethazine-dextromethorphan (PROMETHAZINE-DM) 6.25-15 MG/5ML syrup Take 5 mLs by mouth 4 (four) times daily as needed for cough. 05/11/23  Yes Rinaldo Ratel, [place] N, FNP  amLODipine (NORVASC) 2.5 MG tablet Take 1 tablet (2.5 mg total) by mouth daily. 03/10/23   [name], MD  atorvastatin (LIPITOR) 20 MG tablet Take 1 tablet (20 mg total) by mouth daily. 03/10/23   [name], MD  ibuprofen (ADVIL) 600 MG tablet Take 1 tablet (600 mg total) by mouth  every 6 (six) hours as needed. 04/18/23   Horton, Mayer Masker, MD  ibuprofen (ADVIL) 800 MG tablet Take 1 tablet (800 mg total) by mouth 2 (two) times daily as needed. 04/13/23   [name], MD  levothyroxine (SYNTHROID) 125 MCG tablet Take 1 tablet (125 mcg total) by mouth daily before breakfast. 03/14/23 03/13/24  Reather Littler, MD  metFORMIN (GLUCOPHAGE) 500 MG tablet Take 1 tablet (500 mg total) by mouth 2 (two) times daily with a meal. 03/10/23   [name], MD    Family History Family History  Problem Relation Age of Onset   Diabetes Mother    Hypertension Father    Thyroid disease Sister    Thyroid disease Paternal Grandmother    Thyroid disease Brother    Breast cancer Neg Hx     Social History Social History   Tobacco Use   Smoking status: Some Days    Current packs/day: 5.00    Average  packs/day: 5.0 packs/day for 1 year (5.0 ttl pk-yrs)    Types: Cigars, Cigarettes   Smokeless tobacco: Former   Tobacco comments:    03/28/19 7-8/day  Vaping Use   Vaping status: Never Used  Substance Use Topics   Alcohol use: No    Comment: quit 10/2018   Drug use: No     Allergies   Patient has no known allergies.   Review of Systems Review of Systems  Constitutional:  Negative for fever.  HENT:  Positive for congestion, rhinorrhea and sore throat. Negative for sinus pressure and sinus pain.   Respiratory:  Positive for cough, shortness of breath and wheezing.   Cardiovascular:  Negative for chest pain.  Gastrointestinal:  Negative for abdominal pain, diarrhea and nausea.  Genitourinary:  Negative for dysuria.     Physical Exam Triage Vital Signs ED Triage Vitals  Encounter Vitals Group     BP 05/11/23 1650 109/75     Systolic BP Percentile --      Diastolic BP Percentile --      Pulse Rate 05/11/23 1650 85     Resp 05/11/23 1650 20     Temp 05/11/23 1650 98.1 F (36.7 C)     Temp Source 05/11/23 1650 Oral     SpO2 05/11/23 1650 96 %     Weight --      Height --      Head Circumference --      Peak Flow --      Pain Score 05/11/23 1649 0     Pain Loc --      Pain Education --      Exclude from Growth Chart --    No data found.  Updated Vital Signs BP 109/75 (BP Location: Right Arm)   Pulse 85   Temp 98.1 F (36.7 C) (Oral)   Resp 20   LMP 04/19/2023   SpO2 96%   Visual Acuity Right Eye Distance:   Left Eye Distance:   Bilateral Distance:    Right Eye Near:   Left Eye Near:    Bilateral Near:     Physical Exam Vitals and nursing note reviewed.  Constitutional:      Appearance: Normal appearance.  HENT:     Head: Normocephalic and atraumatic.     Right Ear: External ear normal.     Left Ear: External ear normal.     Nose: Congestion and rhinorrhea present.     Mouth/Throat:     Mouth: Mucous membranes are moist.  Eyes:      Conjunctiva/sclera: Conjunctivae normal.  Cardiovascular:     Rate and Rhythm: Normal rate.     Heart sounds: Normal heart sounds. No murmur heard. Pulmonary:     Effort: Pulmonary effort is normal.     Breath sounds: Wheezing present.  Musculoskeletal:        General: Normal range of motion.  Skin:    General: Skin is warm and dry.  Neurological:     General: No focal deficit present.     Mental Status: She is alert.  Psychiatric:        Mood and Affect: Mood normal.      UC Treatments / Results  Labs (all labs ordered are listed, but only abnormal results are displayed) Labs Reviewed - No data to display  EKG   Radiology No results found.  Procedures Procedures (including critical care time)  Medications Ordered in UC Medications  methylPREDNISolone sodium succinate (SOLU-MEDROL) 125 mg/2 mL injection 60 mg (has no administration in time range)    Initial Impression / Assessment and Plan / UC Course  I have reviewed the triage vital signs and the nursing notes.  Pertinent labs & imaging results that were available during my care of the patient were reviewed by me and considered in my medical decision making (see chart for details).  Vitals and triage reviewed, patient is hemodynamically stable.  Lungs with expiratory wheezing.  Chest x-ray reading official radiology over-read.  Due to duration will cover with Augmentin, if radiology interprets as pneumonia will add macrolide. Symptomatic management discussed, steroid burst for wheezing. POC, f/u care and return precautions given, no questions at this time.      Final Clinical Impressions(s) / UC Diagnoses   Final diagnoses:  Upper respiratory tract infection, unspecified type  Acute cough     Discharge Instructions      Take all antibiotics as prescribed and until finished.  You can take them with food to prevent gastrointestinal upset.  Starting tomorrow take the steroids daily with breakfast.  You can  use the cough syrup as needed, do not drink or drive on this medication as may cause drowsiness.  He can also sleep with a humidifier to help moisten your secretions in addition to 1200 mg of Mucinex daily, this is available over-the-counter.  Ensure you are drinking at least 64 ounces of water.  Follow-up with this urgent care with your primary care provider if no improvement despite antibiotics.      ED Prescriptions     Medication Sig Dispense Auth. Provider   amoxicillin-clavulanate (AUGMENTIN) 875-125 MG tablet Take 1 tablet by mouth every 12 (twelve) hours. 14 tablet Rinaldo Ratel, [place] N, [place]   promethazine-dextromethorphan (PROMETHAZINE-DM) 6.25-15 MG/5ML syrup Take 5 mLs by mouth 4 (four) times daily as needed for cough. 118 mL Rinaldo Ratel, [place] N, [place]   predniSONE (DELTASONE) 20 MG tablet Take 2 tablets (40 mg total) by mouth daily for 5 days. 10 tablet Worthy Boschert, [place] N, FNP      PDMP not reviewed this encounter.   Tighe Gitto, [place] N, [place] 05/11/23 1743

## 2023-05-11 NOTE — Discharge Instructions (Addendum)
Take all antibiotics as prescribed and until finished.  You can take them with food to prevent gastrointestinal upset.  Starting tomorrow take the steroids daily with breakfast.  You can use the cough syrup as needed, do not drink or drive on this medication as may cause drowsiness.  He can also sleep with a humidifier to help moisten your secretions in addition to 1200 mg of Mucinex daily, this is available over-the-counter.  Ensure you are drinking at least 64 ounces of water.  Follow-up with this urgent care with your primary care provider if no improvement despite antibiotics.

## 2023-05-11 NOTE — ED Triage Notes (Signed)
Pt c/o cough and runny nose for 3 weeks. Taking Nyquil, tylenol, cough drops, amoxicillin that had previously. Pain in back when coughs. Pt is getting up lots of phlegm that is yellow-green.

## 2023-05-25 NOTE — Progress Notes (Deleted)
Cardiology Office Note:  .   Date:  05/25/2023  ID:  Tracy Carson, DOB [id number], MRN [id number] PCP: [name], MD  [facility] Providers Cardiologist:  None { Click to update primary MD,subspecialty MD or APP then REFRESH:1}   History of Present Illness: .   Tracy Carson is a 42 y.o. female with a history of Graves' disease, type 2 diabetes, with a referral from her primary provider for chest pain.  She was seen in the ED in September for chest pain.  Her vitals were normal.  Her EKG showed poor R wave progression.  She did not have signs of ACS.  She was given a dose of Toradol.  The diagnosis was costochondritis.  Despite this she was referred to cardiology for chest pain.   ROS: ***  Studies Reviewed: .        *** Risk Assessment/Calculations:   {Does this patient have ATRIAL FIBRILLATION?:[phone]} No BP recorded.  {Refresh Note OR Click here to enter BP  :1}***       Physical Exam:   VS:  LMP 04/19/2023    Wt Readings from Last 3 Encounters:  04/18/23 189 lb (85.7 kg)  04/13/23 188 lb 12.8 oz (85.6 kg)  03/11/23 187 lb 12.8 oz (85.2 kg)    GEN: Well nourished, well developed in no acute distress NECK: No JVD; No carotid bruits CARDIAC: ***RRR, no murmurs, rubs, gallops RESPIRATORY:  Clear to auscultation without rales, wheezing or rhonchi  ABDOMEN: Soft, non-tender, non-distended EXTREMITIES:  No edema; No deformity   ASSESSMENT AND PLAN: .   ***    {Are you ordering a CV Procedure (e.g. stress test, cath, DCCV, TEE, etc)?   Press F2        :[id number]}  Dispo: ***  Signed, Maisie Fus, MD

## 2023-05-27 ENCOUNTER — Ambulatory Visit: Payer: Self-pay | Attending: Internal Medicine | Admitting: Internal Medicine

## 2023-07-06 ENCOUNTER — Other Ambulatory Visit: Payer: Self-pay

## 2023-07-08 ENCOUNTER — Other Ambulatory Visit: Payer: Self-pay

## 2023-08-11 ENCOUNTER — Other Ambulatory Visit: Payer: Self-pay

## 2023-08-15 ENCOUNTER — Other Ambulatory Visit: Payer: Self-pay

## 2023-08-25 ENCOUNTER — Other Ambulatory Visit: Payer: Self-pay

## 2023-08-25 DIAGNOSIS — E890 Postprocedural hypothyroidism: Secondary | ICD-10-CM

## 2023-08-30 ENCOUNTER — Other Ambulatory Visit: Payer: Self-pay

## 2023-09-06 ENCOUNTER — Ambulatory Visit: Payer: Self-pay | Admitting: Endocrinology

## 2023-10-14 ENCOUNTER — Other Ambulatory Visit: Payer: Self-pay

## 2023-10-19 ENCOUNTER — Ambulatory Visit: Payer: Self-pay | Admitting: Endocrinology

## 2023-11-04 ENCOUNTER — Other Ambulatory Visit: Payer: Self-pay

## 2023-11-04 ENCOUNTER — Other Ambulatory Visit: Payer: Self-pay | Admitting: Family Medicine

## 2023-11-04 MED ORDER — ATORVASTATIN CALCIUM 20 MG PO TABS
20.0000 mg | ORAL_TABLET | Freq: Every day | ORAL | 0 refills | Status: DC
  Filled 2023-11-04: qty 30, 30d supply, fill #0

## 2023-11-07 ENCOUNTER — Other Ambulatory Visit: Payer: Self-pay

## 2023-11-09 ENCOUNTER — Other Ambulatory Visit: Payer: Self-pay

## 2023-11-14 ENCOUNTER — Encounter (HOSPITAL_COMMUNITY): Payer: Self-pay

## 2023-11-14 ENCOUNTER — Ambulatory Visit (HOSPITAL_COMMUNITY)
Admission: EM | Admit: 2023-11-14 | Discharge: 2023-11-14 | Disposition: A | Discharge status: Home / Self Care | Payer: Self-pay

## 2023-11-14 ENCOUNTER — Other Ambulatory Visit: Payer: Self-pay

## 2023-11-14 DIAGNOSIS — H109 Unspecified conjunctivitis: Secondary | ICD-10-CM

## 2023-11-14 MED ORDER — MOXIFLOXACIN HCL 0.5 % OP SOLN
1.0000 [drp] | Freq: Three times a day (TID) | OPHTHALMIC | 0 refills | Status: DC
  Filled 2023-11-14: qty 3, 20d supply, fill #0

## 2023-11-14 NOTE — ED Provider Notes (Signed)
 [facility]  Note:  This document was prepared using Dragon voice recognition software and may include unintentional dictation errors.  MRN: [id number] DOB: 1980/11/06  Subjective:   Kolby Myung is a 43 y.o. female presenting for left eye redness and pain with watery discharge.  Patient reports using over-the-counter antihistamine eyedrops with no improvement to symptoms.  Patient denies any known trauma or injury to the left eye.  No known sick contacts with history of conjunctivitis.  Patient denies any symptoms to the right eye.  No nasal congestion, cough, headache, body aches, fatigue.  No current facility-administered medications for this encounter.  Current Outpatient Medications:    moxifloxacin  (VIGAMOX ) 0.5 % ophthalmic solution, Place 1 drop into the left eye 3 (three) times daily., Disp: 3 mL, Rfl: 0   amLODipine  (NORVASC ) 2.5 MG tablet, Take 1 tablet (2.5 mg total) by mouth daily. (Patient not taking: Reported on 11/14/2023), Disp: 90 tablet, Rfl: 1   atorvastatin  (LIPITOR) 20 MG tablet, Take 1 tablet (20 mg total) by mouth daily. Please schedule PCP appt for more refills., Disp: 30 tablet, Rfl: 0   ibuprofen  (ADVIL ) 800 MG tablet, Take 1 tablet (800 mg total) by mouth 2 (two) times daily as needed., Disp: 60 tablet, Rfl: 1   levothyroxine  (SYNTHROID ) 125 MCG tablet, Take 1 tablet (125 mcg total) by mouth daily before breakfast., Disp: 30 tablet, Rfl: 11   metFORMIN  (GLUCOPHAGE ) 500 MG tablet, Take 1 tablet (500 mg total) by mouth 2 (two) times daily with a meal., Disp: 180 tablet, Rfl: 1   No Known Allergies  Past Medical History:  Diagnosis Date   Chest pain    Headache    Hypertension    SOB (shortness of breath)    Thyroid  disease    hyper   Tobacco user    Tuberculosis    as a child     Past Surgical History:  Procedure Laterality Date   CESAREAN SECTION  07/02/2011   Procedure: CESAREAN SECTION;  Surgeon: Truitt Gaba, MD;   Location: [facility];  Service: Gynecology;  Laterality: N/A;   CHOLECYSTECTOMY  2002    Family History  Problem Relation Age of Onset   Diabetes Mother    Hypertension Father    Thyroid  disease Sister    Thyroid  disease Paternal Grandmother    Thyroid  disease Brother    Breast cancer Neg Hx     Social History   Tobacco Use   Smoking status: Some Days    Current packs/day: 5.00    Average packs/day: 5.0 packs/day for 1 year (5.0 ttl pk-yrs)    Types: Cigars, Cigarettes   Smokeless tobacco: Former   Tobacco comments:    03/28/19 7-8/day  Vaping Use   Vaping status: Never Used  Substance Use Topics   Alcohol use: No    Comment: quit 10/2018   Drug use: No    ROS Refer to HPI for ROS details.  Objective:   Vitals: BP (!) 131/90 (BP Location: Left Arm)   Pulse 68   Temp 97.7 F (36.5 C) (Oral)   Resp 16   LMP 11/13/2023 (Exact Date)   SpO2 96%   Physical Exam Vitals and nursing note reviewed.  Constitutional:      General: She is not in acute distress.    Appearance: She is well-developed. She is not ill-appearing or toxic-appearing.  HENT:     Head: Normocephalic and atraumatic.     Mouth/Throat:     Mouth: Mucous  membranes are moist.     Pharynx: Oropharynx is clear.  Eyes:     General: Lids are normal. Vision grossly intact. Gaze aligned appropriately.        Right eye: Discharge present. No foreign body or hordeolum.        Left eye: Discharge present.No foreign body or hordeolum.     Extraocular Movements: Extraocular movements intact.     Conjunctiva/sclera:     Right eye: Right conjunctiva is not injected. No exudate.    Left eye: Left conjunctiva is injected. Exudate present.     Pupils: Pupils are equal, round, and reactive to light.  Cardiovascular:     Rate and Rhythm: Normal rate.  Pulmonary:     Effort: Pulmonary effort is normal. No respiratory distress.  Musculoskeletal:        General: Normal range of motion.  Skin:    General: Skin is warm  and dry.  Neurological:     General: No focal deficit present.     Mental Status: She is alert and oriented to person, place, and time.  Psychiatric:        Mood and Affect: Mood normal.        Behavior: Behavior normal.     Procedures  No results found for this or any previous visit (from the past 24 hours).  No results found.   Assessment and Plan :     Discharge Instructions      1. Conjunctivitis of left eye, unspecified conjunctivitis type (Primary) - moxifloxacin  (VIGAMOX ) 0.5 % ophthalmic solution; Place 1 drop into the left eye 3 (three) times daily.  Dispense: 3 mL; Refill: 0    Margette Sheldon, [place] B, NP 11/14/23 1730

## 2023-11-14 NOTE — ED Triage Notes (Signed)
 Patient here today with c/o left eye redness and pain that started this morning.

## 2023-11-14 NOTE — Discharge Instructions (Addendum)
 1. Conjunctivitis of left eye, unspecified conjunctivitis type (Primary) - moxifloxacin  (VIGAMOX ) 0.5 % ophthalmic solution; Place 1 drop into the left eye 3 (three) times daily.  Dispense: 3 mL; Refill: 0

## 2023-12-09 ENCOUNTER — Other Ambulatory Visit: Payer: Self-pay

## 2023-12-10 LAB — TSH: TSH: 2.25 m[IU]/L

## 2023-12-12 ENCOUNTER — Ambulatory Visit: Payer: Self-pay | Admitting: Endocrinology

## 2023-12-15 ENCOUNTER — Encounter: Payer: Self-pay | Admitting: Endocrinology

## 2023-12-15 ENCOUNTER — Ambulatory Visit (INDEPENDENT_AMBULATORY_CARE_PROVIDER_SITE_OTHER): Payer: Self-pay | Admitting: Endocrinology

## 2023-12-15 ENCOUNTER — Other Ambulatory Visit: Payer: Self-pay

## 2023-12-15 VITALS — BP 120/84 | HR 64 | Resp 20 | Ht 63.0 in | Wt 191.2 lb

## 2023-12-15 DIAGNOSIS — E890 Postprocedural hypothyroidism: Secondary | ICD-10-CM

## 2023-12-15 DIAGNOSIS — Z8639 Personal history of other endocrine, nutritional and metabolic disease: Secondary | ICD-10-CM

## 2023-12-15 DIAGNOSIS — E0500 Thyrotoxicosis with diffuse goiter without thyrotoxic crisis or storm: Secondary | ICD-10-CM

## 2023-12-15 MED ORDER — LEVOTHYROXINE SODIUM 125 MCG PO TABS
125.0000 ug | ORAL_TABLET | Freq: Every day | ORAL | 3 refills | Status: DC
  Filled 2023-12-15: qty 90, 90d supply, fill #0

## 2023-12-15 NOTE — Progress Notes (Signed)
 Outpatient Endocrinology Note Tracy Greysin Medlen, MD   Patient's Name: Tracy Carson    DOB: 03-26-1981    MRN: [id number]  REASON OF VISIT: Follow-up for hypothyroidism  PCP: Joaquin Mulberry, MD  HISTORY OF PRESENT ILLNESS:   Tracy Carson is a 43 y.o. old female with past medical history as listed below is presented for a follow up for postablative hypothyroidism.   Pertinent Thyroid  History: Patient was previously seen by Dr. Hubert Madden and was last time seen in August 2024.  Patient has postablative hypothyroidism, taking thyroid  hormone replacement/levothyroxine .  Patient was diagnosed with hyperthyroidism secondary to Graves' disease in 2012 before pregnancy, at that time she had symptoms of palpitation, shakiness, heat intolerance, difficulty sleeping and increased appetite and hair loss.  She was treated with methimazole  in the beginning.  She was irregular in the follow-up in the beginning.  She was treated with RAI I-131 first in July 2018 with 19.5 mCi, subsequent normalization of thyroid  function.  June 2020, had ER visit and there was recurrence of hyperthyroidism, treated with methimazole .  Due to persistent hyperthyroidism she was treated with RAI I-131 12 mCi in June 2021-second time.  She became hypothyroid by August 2021 with significantly low free T4, started on levothyroxine .  Levothyroxine  dose was adjusted over time.  She has periodic swelling of her eyes and some redness and itching, followed by ? ophthalmologist.  She has been taking levothyroxine  125 mcg daily.    Interval history Patient has been taking levothyroxine  125 mcg daily.  Reports compliance and taking every day.  Denies palpitation or heat intolerance.  She has complaints of gradual weight gain and not able to lose.  Discussed about lifestyle modification and diet control with exercise to help her lose weight.  She has complaints of occasional redness of eyes and watering.  She denies seeing ophthalmology  in the past.  She has prediabetes and on metformin  managed by primary care provider.  She had thyroid  function test with normal TSH of 2.25 few days ago.   Latest Reference Range & Units 12/09/23 09:25  TSH mIU/L 2.25    REVIEW OF SYSTEMS:  As per history of present illness.   PAST MEDICAL HISTORY: Past Medical History:  Diagnosis Date   Chest pain    Headache    Hypertension    SOB (shortness of breath)    Thyroid  disease    hyper   Tobacco user    Tuberculosis    as a child    PAST SURGICAL HISTORY: Past Surgical History:  Procedure Laterality Date   CESAREAN SECTION  07/02/2011   Procedure: CESAREAN SECTION;  Surgeon: Truitt Gaba, MD;  Location: [facility];  Service: Gynecology;  Laterality: N/A;   CHOLECYSTECTOMY  2002    ALLERGIES: No Known Allergies  FAMILY HISTORY:  Family History  Problem Relation Age of Onset   Diabetes Mother    Hypertension Father    Thyroid  disease Sister    Thyroid  disease Paternal Grandmother    Thyroid  disease Brother    Breast cancer Neg Hx     SOCIAL HISTORY: Social History   Socioeconomic History   Marital status: Married    Spouse name: Pierce Brewster   Number of children: 4   Years of education: 7   Highest education level: 7th grade  Occupational History    Comment: na  Tobacco Use   Smoking status: Some Days    Current packs/day: 5.00    Average packs/day: 5.0 packs/day for 1  year (5.0 ttl pk-yrs)    Types: Cigars, Cigarettes   Smokeless tobacco: Former   Tobacco comments:    03/28/19 7-8/day  Vaping Use   Vaping status: Never Used  Substance and Sexual Activity   Alcohol use: No    Comment: quit 10/2018   Drug use: No   Sexual activity: Yes    Birth control/protection: None  Other Topics Concern   Not on file  Social History Narrative   Lives with spouse, 4 daughters   Some caffeine   Social Drivers of Corporate investment banker Strain: Not on file  Food Insecurity: No Food Insecurity (11/26/2021)    Hunger Vital Sign    Worried About Running Out of Food in the Last Year: Never true    Ran Out of Food in the Last Year: Never true  Transportation Needs: No Transportation Needs (11/26/2021)   PRAPARE - Administrator, Civil Service (Medical): No    Lack of Transportation (Non-Medical): No  Physical Activity: Not on file  Stress: Not on file  Social Connections: Not on file    MEDICATIONS:  Current Outpatient Medications  Medication Sig Dispense Refill   atorvastatin  (LIPITOR) 20 MG tablet Take 1 tablet (20 mg total) by mouth daily. Please schedule PCP appt for more refills. 30 tablet 0   ibuprofen  (ADVIL ) 800 MG tablet Take 1 tablet (800 mg total) by mouth 2 (two) times daily as needed. 60 tablet 1   metFORMIN  (GLUCOPHAGE ) 500 MG tablet Take 1 tablet (500 mg total) by mouth 2 (two) times daily with a meal. 180 tablet 1   amLODipine  (NORVASC ) 2.5 MG tablet Take 1 tablet (2.5 mg total) by mouth daily. (Patient not taking: Reported on 12/15/2023) 90 tablet 1   levothyroxine  (SYNTHROID ) 125 MCG tablet Take 1 tablet (125 mcg total) by mouth daily before breakfast. 90 tablet 3   moxifloxacin  (VIGAMOX ) 0.5 % ophthalmic solution Place 1 drop into the left eye 3 (three) times daily. (Patient not taking: Reported on 12/15/2023) 3 mL 0   No current facility-administered medications for this visit.    PHYSICAL EXAM: Vitals:   12/15/23 0946  BP: 120/84  Pulse: 64  Resp: 20  SpO2: 97%  Weight: 191 lb 3.2 oz (86.7 kg)  Height: 5\' 3"  (1.6 m)   Body mass index is 33.87 kg/m.  Wt Readings from Last 3 Encounters:  12/15/23 191 lb 3.2 oz (86.7 kg)  04/18/23 189 lb (85.7 kg)  04/13/23 188 lb 12.8 oz (85.6 kg)    General: Well developed, well nourished female in no apparent distress.  HEENT: AT/[place], no external lesions. Hearing intact to the spoken word Eyes: EOMI. B/l mild proptosis or no lid lag. Conjunctiva mild erythema, no watering. Neck: Trachea midline, neck supple without  appreciable thyromegaly or lymphadenopathy and no palpable thyroid  nodules Lungs: Clear to auscultation, no wheeze. Respirations not labored Heart: S1S2, Regular in rate and rhythm.  Abdomen: Soft, non tender, non distended Neurologic: Alert, oriented, normal speech, deep tendon biceps reflexes normal,  no gross focal neurological deficit Extremities: No pedal pitting edema, no tremors of outstretched hands Skin: Warm, color good.  Psychiatric: Does not appear depressed or anxious  PERTINENT HISTORIC LABORATORY AND IMAGING STUDIES:  All pertinent laboratory results were reviewed. Please see HPI also for further details.   TSH  Date Value Ref Range Status  12/09/2023 2.25 mIU/L Final    Comment:  Reference Range .           > or = 20 Years  0.40-4.50 .                Pregnancy Ranges           First trimester    0.26-2.66           Second trimester   0.55-2.73           Third trimester    0.43-2.91   04/13/2023 1.330 0.450 - 4.500 uIU/mL Final  03/01/2023 7.23 (H) 0.35 - 5.50 uIU/mL Final   T3, Total  Date Value Ref Range Status  04/13/2023 124 71 - 180 ng/dL Final     ASSESSMENT / PLAN  1. H/O Graves' disease   2. Hypothyroidism, postradioiodine therapy   3. Graves' eye disease    - Patient has postablative hypothyroidism, history of Graves' disease status post RAI ablation in July 2018 in June 2021. - Currently taking levothyroxine  120 g daily and recent thyroid  function test TSH normal. - Patient has mild active Graves' eye disease.  Plan: - Continue current dose of levothyroxine  125 mcg daily. - Refer to ophthalmology for Graves' eye disease.  We discussed the medical need for compliance with levothyroxine  therapy, that it is a hormone necessary for life, and that serious consequences may result from noncompliance. Discussed the proper method of levothyroxine  administration: take on an empty stomach in the morning, with water, waiting thirty to sixty  minutes before taking any other beverages or food. Also reviewed the need to take calcium  or iron  supplements or multivitamin (that may contain iron  or calcium ) at least 4 hours after levothyroxine  administration.  Diagnoses and all orders for this visit:  H/O Graves' disease -     Ambulatory referral to Ophthalmology  Hypothyroidism, postradioiodine therapy -     levothyroxine  (SYNTHROID ) 125 MCG tablet; Take 1 tablet (125 mcg total) by mouth daily before breakfast. -     T4, free -     TSH  Graves' eye disease -     Ambulatory referral to Ophthalmology    DISPOSITION Follow up in clinic in 6 months suggested.  Labs prior to follow-up visit as ordered.  All questions answered and patient verbalized understanding of the plan.  Tracy Cherise Fedder, MD [facility] [address] [place], [address] [place], [place] [address] Phone # [phone]  At least part of this note was generated using voice recognition software. Inadvertent word errors may have occurred, which were not recognized during the proofreading process.

## 2023-12-29 ENCOUNTER — Other Ambulatory Visit: Payer: Self-pay | Admitting: Family Medicine

## 2024-01-04 ENCOUNTER — Other Ambulatory Visit: Payer: Self-pay

## 2024-01-10 ENCOUNTER — Other Ambulatory Visit: Payer: Self-pay

## 2024-02-03 ENCOUNTER — Other Ambulatory Visit: Payer: Self-pay | Admitting: Nurse Practitioner

## 2024-02-03 ENCOUNTER — Ambulatory Visit: Payer: Self-pay | Attending: Nurse Practitioner | Admitting: Nurse Practitioner

## 2024-02-03 ENCOUNTER — Ambulatory Visit (HOSPITAL_COMMUNITY)
Admission: RE | Admit: 2024-02-03 | Discharge: 2024-02-03 | Disposition: A | Discharge status: Home / Self Care | Payer: Self-pay | Source: Ambulatory Visit | Attending: Nurse Practitioner | Admitting: Nurse Practitioner

## 2024-02-03 ENCOUNTER — Encounter: Payer: Self-pay | Admitting: Nurse Practitioner

## 2024-02-03 ENCOUNTER — Other Ambulatory Visit: Payer: Self-pay

## 2024-02-03 ENCOUNTER — Other Ambulatory Visit (HOSPITAL_COMMUNITY): Payer: Self-pay | Admitting: Nurse Practitioner

## 2024-02-03 VITALS — BP 115/74 | HR 72 | Resp 19 | Ht 63.0 in | Wt 191.4 lb

## 2024-02-03 DIAGNOSIS — M25551 Pain in right hip: Secondary | ICD-10-CM | POA: Insufficient documentation

## 2024-02-03 DIAGNOSIS — G8929 Other chronic pain: Secondary | ICD-10-CM

## 2024-02-03 DIAGNOSIS — M25562 Pain in left knee: Secondary | ICD-10-CM | POA: Insufficient documentation

## 2024-02-03 DIAGNOSIS — M25552 Pain in left hip: Secondary | ICD-10-CM | POA: Insufficient documentation

## 2024-02-03 DIAGNOSIS — H53143 Visual discomfort, bilateral: Secondary | ICD-10-CM

## 2024-02-03 DIAGNOSIS — R739 Hyperglycemia, unspecified: Secondary | ICD-10-CM

## 2024-02-03 DIAGNOSIS — I10 Essential (primary) hypertension: Secondary | ICD-10-CM

## 2024-02-03 DIAGNOSIS — R52 Pain, unspecified: Secondary | ICD-10-CM | POA: Insufficient documentation

## 2024-02-03 DIAGNOSIS — E559 Vitamin D deficiency, unspecified: Secondary | ICD-10-CM

## 2024-02-03 DIAGNOSIS — M25561 Pain in right knee: Secondary | ICD-10-CM | POA: Insufficient documentation

## 2024-02-03 DIAGNOSIS — E7800 Pure hypercholesterolemia, unspecified: Secondary | ICD-10-CM

## 2024-02-03 MED ORDER — GABAPENTIN 100 MG PO CAPS
100.0000 mg | ORAL_CAPSULE | Freq: Three times a day (TID) | ORAL | 0 refills | Status: DC
Start: 2024-02-03 — End: 2024-05-28
  Filled 2024-02-03: qty 90, 30d supply, fill #0

## 2024-02-03 MED ORDER — ATORVASTATIN CALCIUM 20 MG PO TABS
20.0000 mg | ORAL_TABLET | Freq: Every day | ORAL | 1 refills | Status: DC
  Filled 2024-02-03: qty 90, 90d supply, fill #0
  Filled 2024-05-15 (×2): qty 90, 90d supply, fill #1

## 2024-02-03 MED ORDER — METFORMIN HCL 500 MG PO TABS
500.0000 mg | ORAL_TABLET | Freq: Two times a day (BID) | ORAL | 1 refills | Status: DC
  Filled 2024-02-03: qty 180, 90d supply, fill #0

## 2024-02-03 MED ORDER — AMLODIPINE BESYLATE 2.5 MG PO TABS
2.5000 mg | ORAL_TABLET | Freq: Every day | ORAL | 1 refills | Status: AC
  Filled 2024-02-03: qty 90, 90d supply, fill #0

## 2024-02-03 MED ORDER — DICLOFENAC SODIUM 1 % EX GEL
4.0000 g | Freq: Four times a day (QID) | CUTANEOUS | 1 refills | Status: AC
Start: 2024-02-03 — End: 2024-03-04
  Filled 2024-02-03: qty 200, 30d supply, fill #0

## 2024-02-03 NOTE — Progress Notes (Signed)
 Assessment & Plan:  Tracy Carson was seen today for bone pain.  Diagnoses and all orders for this visit:  Chronic arthralgias of knees and hips -     DG HIPS BILAT WITH PELVIS 3-4 VIEWS; Future -     Arthritis Panel -     Autoimmune Profile -     gabapentin  (NEURONTIN ) 100 MG capsule; Take 1 capsule (100 mg total) by mouth 3 (three) times daily. FOR PAIN -     diclofenac  Sodium (VOLTAREN ) 1 % GEL; Apply 4 g topically 4 (four) times daily for pain. Chronic bilateral leg pain, likely neuropathic. Differential includes arthritis or autoimmune disorder. - Order hip X-ray. - Order labs for vitamin D  levels. - Order autoimmune profile and arthritis panel. - Prescribe gabapentin , evening or daytime dosing as needed.  Eye discomfort Intermittent discomfort with morning film and swelling. - Request submission of morning eye photographs via MyChart.  Primary hypertension -     amLODipine  (NORVASC ) 2.5 MG tablet; Take 1 tablet (2.5 mg total) by mouth daily. Continue amlodipine  as prescribed.  Reminded to bring in blood pressure log for follow  up appointment.  RECOMMENDATIONS: DASH/Mediterranean Diets are healthier choices for HTN.    Hyperglycemia -     metFORMIN  (GLUCOPHAGE ) 500 MG tablet; Take 1 tablet (500 mg total) by mouth 2 (two) times daily with a meal.  Hypercholesterolemia -     atorvastatin  (LIPITOR) 20 MG tablet; Take 1 tablet (20 mg total) by mouth daily.  Vitamin D  deficiency disease -     VITAMIN D  25 Hydroxy (Vit-D Deficiency, Fractures)  Patient has been counseled on age-appropriate routine health concerns for screening and prevention. These are reviewed and up-to-date. Referrals have been placed accordingly. Immunizations are up-to-date or declined.    Subjective:   Chief Complaint  Patient presents with   Bone Pain    Tracy Carson 43 y.o. female presents to office today with body pain and swelling, primarily in the legs and hips.  She is a patient of Dr. Newlin     Lower extremity and hip pain - Progressively worsening pain in the legs and hips over the past 6 months - Pain originates from the hip and radiates downwards - Occurs daily - Pain severity rated as 6 out of 10 - Previously discussed hip pain with primary care provider and was prescribed ibuprofen .    Peripheral edema and swelling Endorses Swelling present in the legs and hips, face, right hand   Cephalgia and cervical pain - Intermittent pain in the nape of the neck and forehead - Pain accompanied by a 'throbbing pain' on the sides of the forehead  Ocular discharge and swelling Reports White, web-like ocular discharge in the mornings - Occasional swelling of the eyes Exam normal today  VRI was used to communicate directly with patient for the entire encounter including providing detailed patient instructions.      BP Readings from Last 3 Encounters:  02/03/24 115/74  12/15/23 120/84  11/14/23 (!) 131/90    Review of Systems  Constitutional:  Negative for fever, malaise/fatigue and weight loss.  HENT: Negative.  Negative for nosebleeds.   Eyes: Negative.  Negative for blurred vision, double vision and photophobia.  Respiratory: Negative.  Negative for cough and shortness of breath.   Cardiovascular:  Positive for leg swelling. Negative for chest pain and palpitations.  Gastrointestinal: Negative.  Negative for heartburn, nausea and vomiting.  Musculoskeletal:  Positive for back pain, joint pain and neck pain. Negative for  myalgias.  Neurological: Negative.  Negative for dizziness, focal weakness, seizures and headaches.  Psychiatric/Behavioral: Negative.  Negative for suicidal ideas.     Past Medical History:  Diagnosis Date   Chest pain    Headache    Hypertension    SOB (shortness of breath)    Thyroid  disease    hyper   Tobacco user    Tuberculosis    as a child    Past Surgical History:  Procedure Laterality Date   CESAREAN SECTION  07/02/2011    Procedure: CESAREAN SECTION;  Surgeon: Olam DELENA Mill, MD;  Location: [facility];  Service: Gynecology;  Laterality: N/A;   CHOLECYSTECTOMY  2002    Family History  Problem Relation Age of Onset   Diabetes Mother    Hypertension Father    Thyroid  disease Sister    Thyroid  disease Paternal Grandmother    Thyroid  disease Brother    Breast cancer Neg Hx     Social History Reviewed with no changes to be made today.   Outpatient Medications Prior to Visit  Medication Sig Dispense Refill   ibuprofen  (ADVIL ) 800 MG tablet Take 1 tablet (800 mg total) by mouth 2 (two) times daily as needed. 60 tablet 1   levothyroxine  (SYNTHROID ) 125 MCG tablet Take 1 tablet (125 mcg total) by mouth daily before breakfast. 90 tablet 3   amLODipine  (NORVASC ) 2.5 MG tablet Take 1 tablet (2.5 mg total) by mouth daily. 90 tablet 1   atorvastatin  (LIPITOR) 20 MG tablet Take 1 tablet (20 mg total) by mouth daily. Please schedule PCP appt for more refills. 30 tablet 0   metFORMIN  (GLUCOPHAGE ) 500 MG tablet Take 1 tablet (500 mg total) by mouth 2 (two) times daily with a meal. 180 tablet 1   moxifloxacin  (VIGAMOX ) 0.5 % ophthalmic solution Place 1 drop into the left eye 3 (three) times daily. (Patient not taking: Reported on 02/03/2024) 3 mL 0   No facility-administered medications prior to visit.    No Known Allergies     Objective:    BP 115/74 (BP Location: Left Arm, Patient Position: Sitting, Cuff Size: Normal)   Pulse 72   Resp 19   Ht 5' 3 (1.6 m)   Wt 191 lb 6.4 oz (86.8 kg)   SpO2 100%   BMI 33.90 kg/m  Wt Readings from Last 3 Encounters:  02/03/24 191 lb 6.4 oz (86.8 kg)  12/15/23 191 lb 3.2 oz (86.7 kg)  04/18/23 189 lb (85.7 kg)    Physical Exam Vitals and nursing note reviewed.  Constitutional:      Appearance: She is well-developed.  HENT:     Head: Normocephalic and atraumatic.  Cardiovascular:     Rate and Rhythm: Normal rate and regular rhythm.     Heart sounds: Normal heart  sounds. No murmur heard.    No friction rub. No gallop.  Pulmonary:     Effort: Pulmonary effort is normal. No tachypnea or respiratory distress.     Breath sounds: Normal breath sounds. No decreased breath sounds, wheezing, rhonchi or rales.  Chest:     Chest wall: No tenderness.  Abdominal:     General: Bowel sounds are normal.     Palpations: Abdomen is soft.  Musculoskeletal:        General: Normal range of motion.     Cervical back: Normal range of motion.  Skin:    General: Skin is warm and dry.  Neurological:     Mental Status: She is  alert and oriented to person, place, and time.     Coordination: Coordination normal.  Psychiatric:        Behavior: Behavior normal. Behavior is cooperative.        Thought Content: Thought content normal.        Judgment: Judgment normal.          Patient has been counseled extensively about nutrition and exercise as well as the importance of adherence with medications and regular follow-up. The patient was given clear instructions to go to ER or return to medical center if symptoms don't improve, worsen or new problems develop. The patient verbalized understanding.   Follow-up: Return in about 3 months (around 05/05/2024).   Haze LELON Servant, FNP-BC [facility] [place], [place] [phone]   02/03/2024, 3:00 PM

## 2024-02-04 LAB — VITAMIN D 25 HYDROXY (VIT D DEFICIENCY, FRACTURES): Vit D, 25-Hydroxy: 17.7 ng/mL — ABNORMAL LOW (ref 30.0–100.0)

## 2024-02-04 LAB — ARTHRITIS PANEL
Anti Nuclear Antibody (ANA): NEGATIVE
Rheumatoid fact SerPl-aCnc: <10 [IU]/mL (ref <14.0)
Sed Rate: 3 mm/h (ref 0–32)
Uric Acid: 4.8 mg/dL (ref 2.6–6.2)

## 2024-02-04 LAB — AUTOIMMUNE PROFILE
Complement C3, Serum: 152 mg/dL (ref 82–167)
dsDNA Ab: 1 [IU]/mL (ref 0–9)

## 2024-02-06 ENCOUNTER — Ambulatory Visit: Payer: Self-pay | Admitting: Nurse Practitioner

## 2024-02-06 DIAGNOSIS — E559 Vitamin D deficiency, unspecified: Secondary | ICD-10-CM

## 2024-02-06 MED ORDER — VITAMIN D (ERGOCALCIFEROL) 1.25 MG (50000 UNIT) PO CAPS
50000.0000 [IU] | ORAL_CAPSULE | ORAL | 0 refills | Status: DC
  Filled 2024-02-06: qty 12, 84d supply, fill #0

## 2024-02-07 ENCOUNTER — Other Ambulatory Visit: Payer: Self-pay

## 2024-02-10 ENCOUNTER — Telehealth: Payer: Self-pay

## 2024-02-10 NOTE — Telephone Encounter (Signed)
 Patient is requesting results of X-ray

## 2024-02-13 ENCOUNTER — Other Ambulatory Visit: Payer: Self-pay | Admitting: Endocrinology

## 2024-02-13 ENCOUNTER — Other Ambulatory Visit: Payer: Self-pay

## 2024-02-13 DIAGNOSIS — E890 Postprocedural hypothyroidism: Secondary | ICD-10-CM

## 2024-02-13 MED ORDER — LEVOTHYROXINE SODIUM 125 MCG PO TABS
125.0000 ug | ORAL_TABLET | Freq: Every day | ORAL | 3 refills | Status: DC
  Filled 2024-02-13: qty 90, 90d supply, fill #0
  Filled 2024-05-15: qty 90, 90d supply, fill #1

## 2024-02-13 NOTE — Telephone Encounter (Signed)
 I did not order x-rays on this patient.  Please forward message to Zelda.  Thank you.

## 2024-02-13 NOTE — Telephone Encounter (Signed)
 Refill request complete

## 2024-02-14 NOTE — Telephone Encounter (Signed)
 She read my note on 02-10-2024. Not sure what her question is    Normal hip xrays  Written by Haze LELON Servant, NP on 02/06/2024  8:32 PM EDT Seen by patient Tracy Carson on 02/10/2024  9:18 PM

## 2024-02-15 NOTE — Telephone Encounter (Signed)
 Noted

## 2024-02-17 ENCOUNTER — Other Ambulatory Visit: Payer: Self-pay

## 2024-05-07 ENCOUNTER — Ambulatory Visit: Payer: Self-pay | Admitting: Family Medicine

## 2024-05-15 ENCOUNTER — Other Ambulatory Visit: Payer: Self-pay

## 2024-05-16 ENCOUNTER — Other Ambulatory Visit: Payer: Self-pay

## 2024-05-28 ENCOUNTER — Ambulatory Visit: Payer: Self-pay | Attending: Family Medicine | Admitting: Family Medicine

## 2024-05-28 ENCOUNTER — Other Ambulatory Visit: Payer: Self-pay

## 2024-05-28 ENCOUNTER — Encounter: Payer: Self-pay | Admitting: Family Medicine

## 2024-05-28 VITALS — BP 129/81 | HR 68 | Temp 97.7°F | Ht 63.0 in | Wt 192.6 lb

## 2024-05-28 DIAGNOSIS — G8929 Other chronic pain: Secondary | ICD-10-CM

## 2024-05-28 DIAGNOSIS — H1010 Acute atopic conjunctivitis, unspecified eye: Secondary | ICD-10-CM

## 2024-05-28 DIAGNOSIS — E7800 Pure hypercholesterolemia, unspecified: Secondary | ICD-10-CM

## 2024-05-28 DIAGNOSIS — Z7984 Long term (current) use of oral hypoglycemic drugs: Secondary | ICD-10-CM

## 2024-05-28 DIAGNOSIS — M25552 Pain in left hip: Secondary | ICD-10-CM

## 2024-05-28 DIAGNOSIS — E890 Postprocedural hypothyroidism: Secondary | ICD-10-CM

## 2024-05-28 DIAGNOSIS — E1169 Type 2 diabetes mellitus with other specified complication: Secondary | ICD-10-CM

## 2024-05-28 DIAGNOSIS — M25561 Pain in right knee: Secondary | ICD-10-CM

## 2024-05-28 DIAGNOSIS — Z23 Encounter for immunization: Secondary | ICD-10-CM

## 2024-05-28 DIAGNOSIS — M25562 Pain in left knee: Secondary | ICD-10-CM

## 2024-05-28 DIAGNOSIS — Z1231 Encounter for screening mammogram for malignant neoplasm of breast: Secondary | ICD-10-CM

## 2024-05-28 DIAGNOSIS — M25551 Pain in right hip: Secondary | ICD-10-CM

## 2024-05-28 DIAGNOSIS — M5440 Lumbago with sciatica, unspecified side: Secondary | ICD-10-CM

## 2024-05-28 LAB — POCT GLYCOSYLATED HEMOGLOBIN (HGB A1C): HbA1c, POC (controlled diabetic range): 8.3 % — AB (ref 0.0–7.0)

## 2024-05-28 MED ORDER — IBUPROFEN 800 MG PO TABS
800.0000 mg | ORAL_TABLET | Freq: Two times a day (BID) | ORAL | 1 refills | Status: AC | PRN
  Filled 2024-05-28: qty 60, 30d supply, fill #0

## 2024-05-28 MED ORDER — ATORVASTATIN CALCIUM 20 MG PO TABS
20.0000 mg | ORAL_TABLET | Freq: Every day | ORAL | 1 refills | Status: AC
Start: 2024-05-28 — End: ?
  Filled 2024-05-28: qty 90, 90d supply, fill #0

## 2024-05-28 MED ORDER — METFORMIN HCL 500 MG PO TABS
500.0000 mg | ORAL_TABLET | Freq: Two times a day (BID) | ORAL | 1 refills | Status: AC
  Filled 2024-05-28: qty 180, 90d supply, fill #0

## 2024-05-28 MED ORDER — OLOPATADINE HCL 0.1 % OP SOLN
1.0000 [drp] | Freq: Two times a day (BID) | OPHTHALMIC | 1 refills | Status: AC
  Filled 2024-05-28: qty 5, 25d supply, fill #0

## 2024-05-28 MED ORDER — CETIRIZINE HCL 10 MG PO TABS
10.0000 mg | ORAL_TABLET | Freq: Every day | ORAL | 1 refills | Status: AC
  Filled 2024-05-28: qty 30, 30d supply, fill #0

## 2024-05-28 NOTE — Progress Notes (Signed)
 Subjective:  Patient ID: Tracy Carson, female    DOB: 1981-07-07  Age: 43 y.o. MRN: [id number]  CC: Medical Management of Chronic Issues (Eyes swelling in the morning)     Discussed the use of AI scribe software for clinical note transcription with the patient, who gave verbal consent to proceed.  History of Present Illness Tracy Carson is a 43 year old female with a history of treatment induced hypothyroidism (radioactive iodine treatment of Graves' disease in 2018 and 2021), migraines, anxiety, and depression who presents with eye swelling and teary eyes.  She experiences swelling in her eyes, particularly in the mornings, with the swelling located on the upper part of her eyes. The swelling sometimes resolves as the day progresses but can persist throughout the day. She also has teary eyes and occasional blurry vision. There is no sneezing or runny nose.  She has diabetes and is currently taking metformin , with her A1c increasing from 6.5 to 8.3. She is also on amlodipine  for hypertension but takes it every other day due to forgetfulness. She continues to see an endocrinologist for management of her Graves disease.  She experiences pain in her neck, lower back, and feet. Gabapentin  has not been effective, but she previously found relief with ibuprofen  800 mg.Xray of the hips was unremarkable.    Past Medical History:  Diagnosis Date   Chest pain    Headache    Hypertension    SOB (shortness of breath)    Thyroid  disease    hyper   Tobacco user    Tuberculosis    as a child    Past Surgical History:  Procedure Laterality Date   CESAREAN SECTION  07/02/2011   Procedure: CESAREAN SECTION;  Surgeon: Olam DELENA Mill, MD;  Location: [facility];  Service: Gynecology;  Laterality: N/A;   CHOLECYSTECTOMY  2002    Family History  Problem Relation Age of Onset   Diabetes Mother    Hypertension Father    Thyroid  disease Sister    Thyroid  disease Paternal Grandmother     Thyroid  disease Brother    Breast cancer Neg Hx     Social History   Socioeconomic History   Marital status: Married    Spouse name: Tita   Number of children: 4   Years of education: 7   Highest education level: 7th grade  Occupational History    Comment: na  Tobacco Use   Smoking status: Some Days    Current packs/day: 5.00    Average packs/day: 5.0 packs/day for 1 year (5.0 ttl pk-yrs)    Types: Cigars, Cigarettes   Smokeless tobacco: Former   Tobacco comments:    03/28/19 7-8/day  Vaping Use   Vaping status: Never Used  Substance and Sexual Activity   Alcohol use: No    Comment: quit 10/2018   Drug use: No   Sexual activity: Yes    Birth control/protection: None  Other Topics Concern   Not on file  Social History Narrative   Lives with spouse, 4 daughters   Some caffeine   Social Drivers of Health   Financial Resource Strain: Medium Risk (02/03/2024)   Overall Financial Resource Strain (CARDIA)    Difficulty of Paying Living Expenses: Somewhat hard  Food Insecurity: Food Insecurity Present (02/03/2024)   Hunger Vital Sign    Worried About Running Out of Food in the Last Year: Sometimes true    Ran Out of Food in the Last Year: Sometimes true  Transportation  Needs: No Transportation Needs (11/26/2021)   PRAPARE - Administrator, Civil Service (Medical): No    Lack of Transportation (Non-Medical): No  Physical Activity: Inactive (02/03/2024)   Exercise Vital Sign    Days of Exercise per Week: 0 days    Minutes of Exercise per Session: 0 min  Stress: No Stress Concern Present (02/03/2024)   [employer] of Occupational Health - Occupational Stress Questionnaire    Feeling of Stress: Not at all  Social Connections: Socially Isolated (02/03/2024)   Social Connection and Isolation Panel    Frequency of Communication with Friends and Family: Once a week    Frequency of Social Gatherings with Friends and Family: Never    Attends Religious  Services: Never    Database Administrator or Organizations: No    Attends Engineer, Structural: Never    Marital Status: Married    No Known Allergies  Outpatient Medications Prior to Visit  Medication Sig Dispense Refill   amLODipine  (NORVASC ) 2.5 MG tablet Take 1 tablet (2.5 mg total) by mouth daily. 90 tablet 1   levothyroxine  (SYNTHROID ) 125 MCG tablet Take 1 tablet (125 mcg total) by mouth daily before breakfast. 90 tablet 3   moxifloxacin  (VIGAMOX ) 0.5 % ophthalmic solution Place 1 drop into the left eye 3 (three) times daily. 3 mL 0   atorvastatin  (LIPITOR) 20 MG tablet Take 1 tablet (20 mg total) by mouth daily. 90 tablet 1   gabapentin  (NEURONTIN ) 100 MG capsule Take 1 capsule (100 mg total) by mouth 3 (three) times daily. FOR PAIN 90 capsule 0   ibuprofen  (ADVIL ) 800 MG tablet Take 1 tablet (800 mg total) by mouth 2 (two) times daily as needed. 60 tablet 1   metFORMIN  (GLUCOPHAGE ) 500 MG tablet Take 1 tablet (500 mg total) by mouth 2 (two) times daily with a meal. 180 tablet 1   Vitamin D , Ergocalciferol , (DRISDOL ) 1.25 MG (50000 UNIT) CAPS capsule Take 1 capsule (50,000 Units total) by mouth once a week. (Patient not taking: Reported on 05/28/2024) 12 capsule 0   No facility-administered medications prior to visit.     ROS Review of Systems  Constitutional:  Negative for activity change and appetite change.  HENT:  Negative for sinus pressure and sore throat.   Eyes:  Positive for discharge (tearing) and visual disturbance. Negative for photophobia, pain and redness.  Respiratory:  Negative for chest tightness, shortness of breath and wheezing.   Cardiovascular:  Negative for chest pain and palpitations.  Gastrointestinal:  Negative for abdominal distention, abdominal pain and constipation.  Genitourinary: Negative.   Musculoskeletal: Negative.   Psychiatric/Behavioral:  Negative for behavioral problems and dysphoric mood.     Objective:  BP 129/81   Pulse 68    Temp 97.7 F (36.5 C) (Oral)   Ht 5' 3 (1.6 m)   Wt 192 lb 9.6 oz (87.4 kg)   SpO2 98%   BMI 34.12 kg/m      05/28/2024   10:48 AM 02/03/2024    1:49 PM 12/15/2023    9:46 AM  BP/Weight  Systolic BP 129 115 120  Diastolic BP 81 74 84  Wt. (Lbs) 192.6 191.4 191.2  BMI 34.12 kg/m2 33.9 kg/m2 33.87 kg/m2      Physical Exam Constitutional:      Appearance: She is well-developed.  Eyes:     General:        Right eye: No discharge.        Left  eye: No discharge.     Comments: Proptosis  Cardiovascular:     Rate and Rhythm: Normal rate.     Heart sounds: Normal heart sounds. No murmur heard. Pulmonary:     Effort: Pulmonary effort is normal.     Breath sounds: Normal breath sounds. No wheezing or rales.  Chest:     Chest wall: No tenderness.  Abdominal:     General: Bowel sounds are normal. There is no distension.     Palpations: Abdomen is soft. There is no mass.     Tenderness: There is no abdominal tenderness.  Musculoskeletal:        General: Normal range of motion.     Right lower leg: No edema.     Left lower leg: No edema.  Neurological:     Mental Status: She is alert and oriented to person, place, and time.  Psychiatric:        Mood and Affect: Mood normal.        Latest Ref Rng & Units 04/18/2023   11:18 AM 04/13/2023   10:56 AM 08/18/2022   10:07 PM  CMP  Glucose 70 - 99 mg/dL 871  862  897   BUN 6 - 20 mg/dL 7  6  8    Creatinine 0.44 - 1.00 mg/dL 9.42  9.32  9.34   Sodium 135 - 145 mmol/L 136  139  135   Potassium 3.5 - 5.1 mmol/L 3.7  4.4  3.5   Chloride 98 - 111 mmol/L 107  104  101   CO2 22 - 32 mmol/L 20  21  24    Calcium  8.9 - 10.3 mg/dL 8.6  9.0  8.5   Total Protein 6.0 - 8.5 g/dL  6.8    Total Bilirubin 0.0 - 1.2 mg/dL  0.2    Alkaline Phos 44 - 121 IU/L  116    AST 0 - 40 IU/L  15    ALT 0 - 32 IU/L  15      Lipid Panel     Component Value Date/Time   CHOL 104 04/13/2023 1056   TRIG 117 04/13/2023 1056   HDL 31 (L) 04/13/2023  1056   CHOLHDL 4.7 (H) 11/15/2016 1038   LDLCALC 52 04/13/2023 1056    CBC    Component Value Date/Time   WBC 11.1 (H) 04/18/2023 1118   RBC 4.40 04/18/2023 1118   HGB 14.3 04/18/2023 1118   HGB 14.3 04/13/2023 1056   HCT 40.8 04/18/2023 1118   HCT 42.8 04/13/2023 1056   PLT 207 04/18/2023 1118   PLT 208 04/13/2023 1056   MCV 92.7 04/18/2023 1118   MCV 97 04/13/2023 1056   MCH 32.5 04/18/2023 1118   MCHC 35.0 04/18/2023 1118   RDW 12.0 04/18/2023 1118   RDW 11.9 04/13/2023 1056   LYMPHSABS 2.5 04/13/2023 1056   MONOABS 0.3 05/28/2019 1332   EOSABS 0.2 04/13/2023 1056   BASOSABS 0.1 04/13/2023 1056    Lab Results  Component Value Date   HGBA1C 8.3 (A) 05/28/2024   Lab Results  Component Value Date   HGBA1C 8.3 (A) 05/28/2024   HGBA1C 6.5 03/10/2023   HGBA1C 6.3 (H) 12/06/2022        Assessment & Plan Allergic conjunctivitis Swelling and teary eyes likely due to allergies, persistent for several months without sneezing or rhinorrhea. -Underlying thyroid  eye disease also noticed - Prescribed olopatadine  eye drops. - Prescribed Zyrtec .  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 - Increased metformin  to 1000 mg twice daily. - Ordered blood tests for cholesterol, kidney, and liver function. - Ordered urine test.  Hypertension Controlled Suboptimal medication adherence with amlodipine  due to forgetfulness. - Advised daily amlodipine  intake. - Suggested reminder for medication adherence. -Counseled on blood pressure goal of less than 130/80, low-sodium, DASH diet, medication compliance, 150 minutes of moderate intensity exercise per week. Discussed medication compliance, adverse effects.   Chronic arthralgias of knees and hips/ Back pain Chronic pain in multiple areas, gabapentin  ineffective, ibuprofen  effective previously. - Discontinued  gabapentin . - Prescribed ibuprofen  800 mg.  Pure hypercholesterolemia Controlled Cholesterol levels to be evaluated with routine blood tests. - Ordered blood tests for cholesterol levels.   Hypothyroidism, post radioiodine therapy History of Graves' disease Presence of thyroid  eye disease Currently on the levothyroxine  Management as per endocrine   General Health Maintenance Due for flu shot and mammogram. Need for influenza vaccine- Administered flu shot. Encounter for screening for breast cancer- Ordered mammogram.      Meds ordered this encounter  Medications   atorvastatin  (LIPITOR) 20 MG tablet    Sig: Take 1 tablet (20 mg total) by mouth daily.    Dispense:  90 tablet    Refill:  1   olopatadine  (PATANOL) 0.1 % ophthalmic solution    Sig: Place 1 drop into both eyes 2 (two) times daily.    Dispense:  5 mL    Refill:  1   cetirizine  (ZYRTEC ) 10 MG tablet    Sig: Take 1 tablet (10 mg total) by mouth daily.    Dispense:  30 tablet    Refill:  1   metFORMIN  (GLUCOPHAGE ) 500 MG tablet    Sig: Take 1 tablet (500 mg total) by mouth 2 (two) times daily with a meal.    Dispense:  180 tablet    Refill:  1    Dose increase   ibuprofen  (ADVIL ) 800 MG tablet    Sig: Take 1 tablet (800 mg total) by mouth 2 (two) times daily as needed.    Dispense:  60 tablet    Refill:  1    Follow-up: Return in about 3 months (around 08/28/2024) for Chronic medical conditions.       Corrina Sabin, MD, FAAFP. [facility] [place], [place] [phone]   05/28/2024, 12:22 PM

## 2024-05-28 NOTE — Progress Notes (Signed)
Flu vaccine administered per protocols.  Information sheet given. Patient denies and pain or discomfort at injection site. Tolerated injection well no reaction.  

## 2024-05-28 NOTE — Patient Instructions (Signed)
 Alergias en los adultos Allergies, Adult Ignacia alergia es una afeccin que hace que el sistema de defensa del cuerpo (sistema inmunitario) tenga una reaccin muy fuerte a un alrgeno. Un alrgeno es una sustancia que es inofensiva para la mayora de las personas, pero que puede causar una reaccin en time warner. Las alergias suelen afectar la nariz (rinitis alrgica), los ojos (conjuntivitis alrgica), la piel (dermatitis atpica) y investment banker, corporate. Pueden ser leves, moderadas o graves. No se pueden transmitir de una persona a otra. Las deere & company pueden comenzar a actuary. En algunos casos, desaparecen a medida que avon products. Cules son las causas? Las alergias son causadas por alrgenos. Pueden ser: Alrgenos del exterior. Estos incluyen el polen, el humo de los automviles y lawyer. Alrgenos del interior. Estos incluyen la tierra, el humo, el moho y la caspa de las [place]. Otros alrgenos. Estos incluyen alimentos, medicamentos, aromas y picaduras de insectos. Qu incrementa el riesgo? Es ms probable que sufra alergias si tiene: Tiene familiares con environmental consultant. Familiares con una afeccin que pueda ser causada por alrgenos, como el asma. Cules son los signos o sntomas? Los sntomas dependen de la gravedad de la programmer, multimedia. Sntomas leves o moderados Nariz tapada o que gotea (congestin nasal) o estornudos. Picazn en la boca, los odos o la garganta. Goteo posnasal. Esta es una sensacin de mucosidad que gotea por la parte posterior de la garganta. Dolor de advertising copywriter. Ojos rojos, lagrimosos, hinchados o con picazn. Zonas de la piel hinchadas, enrojecidas y con picazn (ronchas) o erupcin. Clicos estomacales o meteorismo. Sntomas graves Una alergia fuerte a alimentos, medicamentos o picaduras de insectos puede causar una reaccin grave (reaccin anafilctica). Algunos de los sntomas son los siguientes: Rostro enrojecido. Toser o emitir sonidos de silbidos agudos al  respirar, ms a menudo al exhalar (sibilancias). Labios, lengua o boca hinchados. Hinchazn u opresin en la garganta. Dolor u opresin en el pecho, o latidos cardacos rpidos. Dificultad para respirar o falta de aire. Dolor en el abdomen. Vmitos o diarrea. Mareos o newell rubbermaid. Cmo se diagnostica? Las deere & company se diagnostican en funcin de los sntomas, los antecedentes familiares y mdicos, y un examen fsico. Tambin pueden hacerle pruebas, por ejemplo: Pruebas cutneas. Estas pueden realizarse para ver cmo reacciona la piel a los alrgenos. Las pruebas incluyen: Prueba de puncin. Para esta prueba, se introduce el alrgeno en el cuerpo a travs de un pequeo pinchazo en la piel. Prueba intradrmica. Para esta prueba, se inyecta una pequea cantidad del alrgeno debajo de la primera capa de la piel. Prueba de parche. Para esta prueba, se coloca una pequea cantidad de alrgeno international paper. La zona se cubre y luego se examina despus de time warner. Anlisis de [place]. Prueba de provocacin. Para esta prueba, debe ingerir o inhalar el alrgeno para ver si le produce una reaccin. Tambin pueden pedirle que: Lleve un registro de los conocophillips come. Esto significa que debe anotar ppg industries, las bebidas y los sntomas diarios. Pruebe una dieta de eliminacin. Para hacer esto: Deje de comer ciertos alimentos. Vuelva a incorporar esos alimentos uno por uno para averiguar si hay alguno que le cause una reaccin. Cmo se trata?     El tratamiento para las alergias depende de los sntomas. Puede incluir: Paos fros y hmedos (compresas fras). Estos pueden usarse para aliviar la picazn y la hinchazn. Gotas oftlmicas o erie insurance group. Una solucin salina para limpiarse la nariz y mantenerla hmeda (irrigacin nasal). La solucin salina se elabora con  agua y sal. Un humidificador. Esto puede agregar humedad al aire. Cremas para la piel. Estas pueden tratar las  erupciones o la picazn. Cambios en la dieta para eliminar los alimentos que causan [place]. Exponerse varias veces a pequeas cantidades de alrgenos. Esto puede ayudar al organismo a generar defensas contra ellos (tolerancia). Este proceso se denomina inmunoterapia. Se puede hacer mediante lo siguiente: Vacunas contra la alergia. Quiere decir que le aplican una inyeccin del alrgeno. Inmunoterapia sublingual. Quiere decir que usted toma una pequea dosis de alrgeno debajo de la lengua. Medicamentos para la alergia (antihistamnicos) u otros medicamentos. Estos pueden ayudar a chief technology officer. Usar un management consultant. Un lpiz autoinyector es un dispositivo lleno de automatic data que le administra una inyeccin de emergencia de epinefrina. El mdico le ensear a usarlo. Siga estas instrucciones en su casa: Medicamentos  Use o aplquese los medicamentos de venta libre y los recetados solamente como se lo haya indicado el mdico. Siempre lleve su lpiz autoinyector si est en riesgo de tener una reaccin anafilctica. Aplquese la inyeccin como se lo haya indicado el mdico. Comida y bebida Siga las instrucciones del mdico respecto de lo que puede comer y product manager. Beber suficiente lquido para radio producer pis (orina) de color amarillo plido. Instrucciones generales Use un brazalete o collar de alerta mdica si alguna vez ha tenido financial risk analyst. Evite los alrgenos conocidos cuando pueda. Concurra a todas las visitas de seguimiento. El mdico observar sus sntomas y metallurgist de las opciones de [place]. Comunquese con un mdico si: Los sntomas no mejoran con scientist, research (medical). Solicite ayuda de inmediato si: Presenta cualquier sntoma de reaccin anafilctica. Usa  un lpiz autoinyector. Necesitar ms atencin mdica incluso si el medicamento parece estar actuando. La reaccin anafilctica puede suceder otra vez en el transcurso de 72 horas (anafilaxia de  rebote). Estos sntomas pueden customer service manager. Use el lpiz autoinyector de inmediato. Luego llame al 911. No espere a ver si los sntomas desaparecen. No conduzca por sus propios medios officemax incorporated. Esta informacin no tiene theme park manager el consejo del mdico. Asegrese de hacerle al mdico cualquier pregunta que tenga. Document Revised: 04/27/2022 Document Reviewed: 04/27/2022 Elsevier Patient Education  2024 [employer].

## 2024-05-30 ENCOUNTER — Other Ambulatory Visit: Payer: Self-pay

## 2024-05-30 ENCOUNTER — Ambulatory Visit: Payer: Self-pay | Admitting: Family Medicine

## 2024-05-30 DIAGNOSIS — E1129 Type 2 diabetes mellitus with other diabetic kidney complication: Secondary | ICD-10-CM

## 2024-05-30 LAB — CMP14+EGFR
ALT: 34 IU/L — ABNORMAL HIGH (ref 0–32)
AST: 25 IU/L (ref 0–40)
Albumin: 4.3 g/dL (ref 3.9–4.9)
Alkaline Phosphatase: 144 IU/L — ABNORMAL HIGH (ref 41–116)
BUN/Creatinine Ratio: 8 — ABNORMAL LOW (ref 9–23)
BUN: 5 mg/dL — ABNORMAL LOW (ref 6–24)
Bilirubin Total: 0.5 mg/dL (ref 0.0–1.2)
CO2: 21 mmol/L (ref 20–29)
Calcium: 9.4 mg/dL (ref 8.7–10.2)
Chloride: 99 mmol/L (ref 96–106)
Creatinine, Ser: 0.65 mg/dL (ref 0.57–1.00)
Globulin, Total: 2.7 g/dL (ref 1.5–4.5)
Glucose: 181 mg/dL — ABNORMAL HIGH (ref 70–99)
Potassium: 4.2 mmol/L (ref 3.5–5.2)
Sodium: 136 mmol/L (ref 134–144)
Total Protein: 7 g/dL (ref 6.0–8.5)
eGFR: 112 mL/min/1.73 (ref >59)

## 2024-05-30 LAB — LP+NON-HDL CHOLESTEROL
Cholesterol, Total: 113 mg/dL (ref 100–199)
HDL: 32 mg/dL — ABNORMAL LOW (ref >39)
LDL Chol Calc (NIH): 57 mg/dL (ref 0–99)
Total Non-HDL-Chol (LDL+VLDL): 81 mg/dL (ref 0–129)
Triglycerides: 135 mg/dL (ref 0–149)
VLDL Cholesterol Cal: 24 mg/dL (ref 5–40)

## 2024-05-30 LAB — MICROALBUMIN / CREATININE URINE RATIO
Creatinine, Urine: 70.2 mg/dL
Microalb/Creat Ratio: 107 mg/g{creat} — AB (ref 0–29)
Microalbumin, Urine: 74.9 ug/mL

## 2024-05-30 MED ORDER — DAPAGLIFLOZIN PROPANEDIOL 10 MG PO TABS
10.0000 mg | ORAL_TABLET | Freq: Every day | ORAL | 1 refills | Status: AC
  Filled 2024-05-30: qty 90, 90d supply, fill #0
  Filled 2024-06-04: qty 15, 15d supply, fill #0
  Filled 2024-06-18: qty 15, 15d supply, fill #1
  Filled 2024-06-19: qty 5, 5d supply, fill #1
  Filled 2024-06-22: qty 90, 90d supply, fill #2

## 2024-06-04 ENCOUNTER — Ambulatory Visit: Payer: Self-pay

## 2024-06-04 ENCOUNTER — Other Ambulatory Visit: Payer: Self-pay

## 2024-06-08 ENCOUNTER — Other Ambulatory Visit: Payer: Self-pay

## 2024-06-11 ENCOUNTER — Ambulatory Visit: Payer: Self-pay

## 2024-06-13 ENCOUNTER — Other Ambulatory Visit: Payer: Self-pay

## 2024-06-15 ENCOUNTER — Ambulatory Visit: Payer: Self-pay | Admitting: Endocrinology

## 2024-06-18 ENCOUNTER — Other Ambulatory Visit: Payer: Self-pay

## 2024-06-19 ENCOUNTER — Other Ambulatory Visit: Payer: Self-pay

## 2024-06-20 ENCOUNTER — Other Ambulatory Visit: Payer: Self-pay

## 2024-06-22 ENCOUNTER — Other Ambulatory Visit: Payer: Self-pay

## 2024-06-25 ENCOUNTER — Ambulatory Visit: Payer: Self-pay | Attending: Family Medicine

## 2024-06-25 ENCOUNTER — Other Ambulatory Visit: Payer: Self-pay

## 2024-06-25 DIAGNOSIS — E1129 Type 2 diabetes mellitus with other diabetic kidney complication: Secondary | ICD-10-CM

## 2024-06-26 ENCOUNTER — Ambulatory Visit: Payer: Self-pay | Admitting: Family Medicine

## 2024-06-26 LAB — CMP14+EGFR
ALT: 25 IU/L (ref 0–32)
AST: 25 IU/L (ref 0–40)
Albumin: 4.6 g/dL (ref 3.9–4.9)
Alkaline Phosphatase: 134 IU/L — ABNORMAL HIGH (ref 41–116)
BUN/Creatinine Ratio: 13 (ref 9–23)
BUN: 8 mg/dL (ref 6–24)
Bilirubin Total: 0.3 mg/dL (ref 0.0–1.2)
CO2: 21 mmol/L (ref 20–29)
Calcium: 9.5 mg/dL (ref 8.7–10.2)
Chloride: 102 mmol/L (ref 96–106)
Creatinine, Ser: 0.64 mg/dL (ref 0.57–1.00)
Globulin, Total: 2.6 g/dL (ref 1.5–4.5)
Glucose: 135 mg/dL — ABNORMAL HIGH (ref 70–99)
Potassium: 4.3 mmol/L (ref 3.5–5.2)
Sodium: 136 mmol/L (ref 134–144)
Total Protein: 7.2 g/dL (ref 6.0–8.5)
eGFR: 112 mL/min/1.73 (ref >59)

## 2024-07-05 ENCOUNTER — Ambulatory Visit
Admission: RE | Admit: 2024-07-05 | Discharge: 2024-07-05 | Disposition: A | Discharge status: Home / Self Care | Payer: Self-pay | Source: Ambulatory Visit

## 2024-07-05 DIAGNOSIS — Z1231 Encounter for screening mammogram for malignant neoplasm of breast: Secondary | ICD-10-CM

## 2024-08-06 ENCOUNTER — Other Ambulatory Visit: Payer: Self-pay

## 2024-08-07 ENCOUNTER — Ambulatory Visit: Payer: Self-pay | Admitting: Endocrinology

## 2024-08-07 LAB — T4, FREE: Free T4: 1.2 ng/dL (ref 0.8–1.8)

## 2024-08-07 LAB — TSH: TSH: 2.05 m[IU]/L

## 2024-08-09 ENCOUNTER — Ambulatory Visit (INDEPENDENT_AMBULATORY_CARE_PROVIDER_SITE_OTHER): Payer: Self-pay | Admitting: Endocrinology

## 2024-08-09 ENCOUNTER — Other Ambulatory Visit: Payer: Self-pay

## 2024-08-09 ENCOUNTER — Encounter: Payer: Self-pay | Admitting: Endocrinology

## 2024-08-09 VITALS — BP 122/80 | HR 76 | Resp 16 | Ht 63.0 in | Wt 185.4 lb

## 2024-08-09 DIAGNOSIS — E890 Postprocedural hypothyroidism: Secondary | ICD-10-CM

## 2024-08-09 DIAGNOSIS — Z8639 Personal history of other endocrine, nutritional and metabolic disease: Secondary | ICD-10-CM

## 2024-08-09 DIAGNOSIS — E0500 Thyrotoxicosis with diffuse goiter without thyrotoxic crisis or storm: Secondary | ICD-10-CM

## 2024-08-09 DIAGNOSIS — H05839 Thyroid orbitopathy, unspecified orbit: Secondary | ICD-10-CM

## 2024-08-09 MED ORDER — LEVOTHYROXINE SODIUM 125 MCG PO TABS
125.0000 ug | ORAL_TABLET | Freq: Every day | ORAL | 3 refills | Status: AC
  Filled 2024-08-09: qty 30, 30d supply, fill #0

## 2024-08-09 NOTE — Progress Notes (Signed)
 "  Outpatient Endocrinology Note Tracy Cacioppo, MD   Patient's Name: Tracy Carson    DOB: [birth year]    MRN: [id number]  REASON OF VISIT: Follow-up for hypothyroidism  PCP: Delbert Clam, MD  HISTORY OF PRESENT ILLNESS:   Tracy Carson is a 44 y.o. old female with past medical history as listed below is presented for a follow up for postablative hypothyroidism.   Pertinent Thyroid  History: Patient was previously seen by Dr. Von and was last time seen in August 2024.  Patient has postablative hypothyroidism, taking thyroid  hormone replacement/levothyroxine .  Patient was diagnosed with hyperthyroidism secondary to Graves' disease in 2012 before pregnancy, at that time she had symptoms of palpitation, shakiness, heat intolerance, difficulty sleeping and increased appetite and hair loss.  She was treated with methimazole  in the beginning.  She was irregular in the follow-up in the beginning.  She was treated with RAI I-131 first in July 2018 with 19.5 mCi, subsequent normalization of thyroid  function.  June 2020, had ER visit and there was recurrence of hyperthyroidism, treated with methimazole .  Due to persistent hyperthyroidism she was treated with RAI I-131 12 mCi in June 2021-second time.  She became hypothyroid by August 2021 with significantly low free T4, started on levothyroxine .  Levothyroxine  dose was adjusted over time.  She has periodic swelling of her eyes and some redness and itching, followed by ? ophthalmologist.  She has been taking levothyroxine  125 mcg daily.  # Patient has type 2 diabetes mellitus, managed by PCP.  On Jardiance and metformin .   Interval history Patient has been taking levothyroxine  125 mcg daily.  She takes in the morning and reports compliance.  She complains of occasional fatigue otherwise no complaints today.  Denies palpitation and heat intolerance.  Recent thyroid  function test normal as follows.   Latest Reference Range & Units 08/06/24  10:59  TSH mIU/L 2.05  T4,Free(Direct) 0.8 - 1.8 ng/dL 1.2   Patient complains of occasional redness and watering of the eyes.  She has not been seeing ophthalmology lately.  REVIEW OF SYSTEMS:  As per history of present illness.   PAST MEDICAL HISTORY: Past Medical History:  Diagnosis Date   Chest pain    Headache    Hypertension    SOB (shortness of breath)    Thyroid  disease    hyper   Tobacco user    Tuberculosis    as a child    PAST SURGICAL HISTORY: Past Surgical History:  Procedure Laterality Date   CESAREAN SECTION  07/02/2011   Procedure: CESAREAN SECTION;  Surgeon: Olam DELENA Mill, MD;  Location: [facility];  Service: Gynecology;  Laterality: N/A;   CHOLECYSTECTOMY  2002    ALLERGIES: No Known Allergies  FAMILY HISTORY:  Family History  Problem Relation Age of Onset   Diabetes Mother    Hypertension Father    Thyroid  disease Sister    Thyroid  disease Paternal Grandmother    Thyroid  disease Brother    Breast cancer Neg Hx     SOCIAL HISTORY: Social History   Socioeconomic History   Marital status: Married    Spouse name: Tita   Number of children: 4   Years of education: 7   Highest education level: 7th grade  Occupational History    Comment: na  Tobacco Use   Smoking status: Some Days    Current packs/day: 5.00    Average packs/day: 5.0 packs/day for 1 year (5.0 ttl pk-yrs)    Types: Cigars, Cigarettes  Smokeless tobacco: Former   Tobacco comments:    03/28/19 7-8/day  Vaping Use   Vaping status: Never Used  Substance and Sexual Activity   Alcohol use: No    Comment: quit 10/2018   Drug use: No   Sexual activity: Yes    Birth control/protection: None  Other Topics Concern   Not on file  Social History Narrative   Lives with spouse, 4 daughters   Some caffeine   Social Drivers of Health   Tobacco Use: High Risk (08/09/2024)   Patient History    Smoking Tobacco Use: Some Days    Smokeless Tobacco Use: Former    Passive  Exposure: Not on Actuary Strain: Medium Risk (02/03/2024)   Overall Financial Resource Strain (CARDIA)    Difficulty of Paying Living Expenses: Somewhat hard  Food Insecurity: Food Insecurity Present (02/03/2024)   Epic    Worried About Programme Researcher, Broadcasting/film/video in the Last Year: Sometimes true    Ran Out of Food in the Last Year: Sometimes true  Transportation Needs: No Transportation Needs (11/26/2021)   PRAPARE - Administrator, Civil Service (Medical): No    Lack of Transportation (Non-Medical): No  Physical Activity: Inactive (02/03/2024)   Exercise Vital Sign    Days of Exercise per Week: 0 days    Minutes of Exercise per Session: 0 min  Stress: No Stress Concern Present (02/03/2024)   [employer] of Occupational Health - Occupational Stress Questionnaire    Feeling of Stress: Not at all  Social Connections: Socially Isolated (02/03/2024)   Social Connection and Isolation Panel    Frequency of Communication with Friends and Family: Once a week    Frequency of Social Gatherings with Friends and Family: Never    Attends Religious Services: Never    Database Administrator or Organizations: No    Attends Banker Meetings: Never    Marital Status: Married  Depression (PHQ2-9): Medium Risk (05/28/2024)   Depression (PHQ2-9)    PHQ-2 Score: 5  Alcohol Screen: Low Risk (02/03/2024)   Alcohol Screen    Last Alcohol Screening Score (AUDIT): 0  Housing: Low Risk (02/03/2024)   Epic    Unable to Pay for Housing in the Last Year: No    Number of Times Moved in the Last Year: 0    Homeless in the Last Year: No  Utilities: Not At Risk (02/03/2024)   Epic    Threatened with loss of utilities: No  Health Literacy: Adequate Health Literacy (02/03/2024)   B1300 Health Literacy    Frequency of need for help with medical instructions: Never    MEDICATIONS:  Current Outpatient Medications  Medication Sig Dispense Refill   amLODipine  (NORVASC ) 2.5 MG  tablet Take 1 tablet (2.5 mg total) by mouth daily. (Patient taking differently: Take 2.5 mg by mouth daily. Only takes as needed per patient) 90 tablet 1   atorvastatin  (LIPITOR) 20 MG tablet Take 1 tablet (20 mg total) by mouth daily. 90 tablet 1   cetirizine  (ZYRTEC ) 10 MG tablet Take 1 tablet (10 mg total) by mouth daily. 30 tablet 1   dapagliflozin  propanediol (FARXIGA ) 10 MG TABS tablet Take 1 tablet (10 mg total) by mouth daily before breakfast. 90 tablet 1   ibuprofen  (ADVIL ) 800 MG tablet Take 1 tablet (800 mg total) by mouth 2 (two) times daily as needed. 60 tablet 1   levothyroxine  (SYNTHROID ) 125 MCG tablet Take 1 tablet (125 mcg total)  by mouth daily before breakfast. 90 tablet 3   metFORMIN  (GLUCOPHAGE ) 500 MG tablet Take 1 tablet (500 mg total) by mouth 2 (two) times daily with a meal. 180 tablet 1   olopatadine  (PATANOL) 0.1 % ophthalmic solution Place 1 drop into both eyes 2 (two) times daily. 5 mL 1   No current facility-administered medications for this visit.    PHYSICAL EXAM: Vitals:   08/09/24 1337  BP: 122/80  Pulse: 76  Resp: 16  SpO2: 97%  Weight: 185 lb 6.4 oz (84.1 kg)  Height: 5' 3 (1.6 m)    Body mass index is 32.84 kg/m.  Wt Readings from Last 3 Encounters:  08/09/24 185 lb 6.4 oz (84.1 kg)  05/28/24 192 lb 9.6 oz (87.4 kg)  02/03/24 191 lb 6.4 oz (86.8 kg)    General: Well developed, well nourished female in no apparent distress.  HEENT: AT/[place], no external lesions. Hearing intact to the spoken word Eyes: EOMI. B/l mild proptosis or no lid lag. Conjunctiva mild erythema, no watering. Neck: Trachea midline, neck supple without appreciable thyromegaly or lymphadenopathy and no palpable thyroid  nodules Lungs: Clear to auscultation, no wheeze. Respirations not labored Heart: S1S2, Regular in rate and rhythm.  Abdomen: Soft, non tender, non distended Neurologic: Alert, oriented, normal speech, deep tendon biceps reflexes normal,  no gross focal  neurological deficit Extremities: No pedal pitting edema, no tremors of outstretched hands Skin: Warm, color good.  Psychiatric: Does not appear depressed or anxious  PERTINENT HISTORIC LABORATORY AND IMAGING STUDIES:  All pertinent laboratory results were reviewed. Please see HPI also for further details.   TSH  Date Value Ref Range Status  08/06/2024 2.05 mIU/L Final    Comment:              Reference Range .           > or = 20 Years  0.40-4.50 .                Pregnancy Ranges           First trimester    0.26-2.66           Second trimester   0.55-2.73           Third trimester    0.43-2.91   12/09/2023 2.25 mIU/L Final    Comment:              Reference Range .           > or = 20 Years  0.40-4.50 .                Pregnancy Ranges           First trimester    0.26-2.66           Second trimester   0.55-2.73           Third trimester    0.43-2.91   04/13/2023 1.330 0.450 - 4.500 uIU/mL Final   T3, Total  Date Value Ref Range Status  04/13/2023 124 71 - 180 ng/dL Final     ASSESSMENT / PLAN  1. Hypothyroidism, postradioiodine therapy   2. H/O Graves' disease   3. Graves' eye disease    - Patient has postablative hypothyroidism, history of Graves' disease status post RAI ablation in July 2018 in June 2021. - Currently taking levothyroxine  125 mcg daily. - Patient has mild active Graves' eye disease.  Plan: - Recent thyroid  function test normal continue current dose of  levothyroxine  125 mcg daily. - Refer to ophthalmology for Graves' eye disease.  Advised for using hydrant eyedrops over-the-counter from pharmacy.  Diagnoses and all orders for this visit:  Hypothyroidism, postradioiodine therapy -     T4, free -     TSH  H/O Graves' disease  Graves' eye disease     DISPOSITION Follow up in clinic in 6 months suggested.  Labs prior to follow-up visit as ordered.  All questions answered and patient verbalized understanding of the plan.  Shaquira Moroz, MD [facility] End[facility] [address] [place], [address] [place], [place] [address] Phone # [phone]  At least part of this note was generated using voice recognition software. Inadvertent word errors may have occurred, which were not recognized during the proofreading process.    "

## 2024-08-28 ENCOUNTER — Ambulatory Visit: Payer: Self-pay

## 2024-08-28 ENCOUNTER — Ambulatory Visit: Payer: Self-pay | Admitting: Family Medicine

## 2024-08-28 NOTE — Telephone Encounter (Signed)
 FYI Only or Action Required?: FYI only for provider: appointment scheduled on 3/25 (reschedule).  Patient was last seen in primary care on 05/28/2024 by Newlin, Enobong, MD.  Called Nurse Triage reporting Pain (Legs, neck).  Symptoms began chronic, unchanged.  Interventions attempted: Other: calls to reschedule appointment from today.  Symptoms are: unchanged.  Triage Disposition: See PCP Within 2 Weeks - rescheduled earliest available.  Patient/caregiver understands and will follow disposition?: Yes     Message from Antwanette L sent at 08/28/2024  2:09 PM EST  Reason for Triage: Pt is experiencing pain in both legs, neck, and the back of her head. The pt needs to schedule an office visit for 3 months (around 08/28/2024) for Chronic medical conditions.   Reason for Disposition  Requesting regular office appointment  Answer Assessment - Initial Assessment Questions 1. REASON FOR CALL: What is the main reason for your call? or How can I best help you?     Needs to reschedule appt from today.   2. SYMPTOMS : Do you have any symptoms?      No new symptoms, ongoing chronic pain that has not changed or become worse.   3. OTHER QUESTIONS: Do you have any other questions?     Denies  Protocols used: Information Only Call - No Triage-A-AH

## 2024-08-28 NOTE — Telephone Encounter (Signed)
 Noted.

## 2024-10-17 ENCOUNTER — Ambulatory Visit: Payer: Self-pay | Admitting: Family Medicine

## 2025-01-28 ENCOUNTER — Other Ambulatory Visit: Payer: Self-pay

## 2025-02-04 ENCOUNTER — Ambulatory Visit: Payer: Self-pay | Admitting: Endocrinology
